# Patient Record
Sex: Male | Born: 1935 | Race: White | Hispanic: No | Marital: Married | Smoking: Former smoker
Health system: Southern US, Community
[De-identification: ages and names within clinical notes are randomized; demographics above are authoritative.]

## PROBLEM LIST (undated history)

## (undated) DIAGNOSIS — C9110 Chronic lymphocytic leukemia of B-cell type not having achieved remission: Secondary | ICD-10-CM

## (undated) DIAGNOSIS — E119 Type 2 diabetes mellitus without complications: Secondary | ICD-10-CM

## (undated) DIAGNOSIS — F329 Major depressive disorder, single episode, unspecified: Secondary | ICD-10-CM

## (undated) DIAGNOSIS — I493 Ventricular premature depolarization: Secondary | ICD-10-CM

## (undated) DIAGNOSIS — I350 Nonrheumatic aortic (valve) stenosis: Secondary | ICD-10-CM

## (undated) DIAGNOSIS — C679 Malignant neoplasm of bladder, unspecified: Secondary | ICD-10-CM

## (undated) DIAGNOSIS — I10 Essential (primary) hypertension: Secondary | ICD-10-CM

## (undated) DIAGNOSIS — I2510 Atherosclerotic heart disease of native coronary artery without angina pectoris: Secondary | ICD-10-CM

## (undated) DIAGNOSIS — I255 Ischemic cardiomyopathy: Secondary | ICD-10-CM

## (undated) DIAGNOSIS — C3490 Malignant neoplasm of unspecified part of unspecified bronchus or lung: Secondary | ICD-10-CM

## (undated) DIAGNOSIS — I252 Old myocardial infarction: Secondary | ICD-10-CM

## (undated) DIAGNOSIS — Z952 Presence of prosthetic heart valve: Secondary | ICD-10-CM

## (undated) DIAGNOSIS — F32A Depression, unspecified: Secondary | ICD-10-CM

## (undated) DIAGNOSIS — R918 Other nonspecific abnormal finding of lung field: Secondary | ICD-10-CM

## (undated) HISTORY — PX: INGUINAL HERNIA REPAIR: SUR1180

## (undated) HISTORY — DX: Chronic lymphocytic leukemia of B-cell type not having achieved remission: C91.10

## (undated) HISTORY — DX: Atherosclerotic heart disease of native coronary artery without angina pectoris: I25.10

## (undated) HISTORY — DX: Major depressive disorder, single episode, unspecified: F32.9

## (undated) HISTORY — DX: Type 2 diabetes mellitus without complications: E11.9

## (undated) HISTORY — DX: Malignant neoplasm of bladder, unspecified: C67.9

## (undated) HISTORY — DX: Nonrheumatic aortic (valve) stenosis: I35.0

## (undated) HISTORY — PX: LUNG REMOVAL, PARTIAL: SHX233

## (undated) HISTORY — PX: OTHER SURGICAL HISTORY: SHX169

## (undated) HISTORY — DX: Atherosclerotic heart disease of native coronary artery without angina pectoris: I25.2

## (undated) HISTORY — DX: Malignant neoplasm of unspecified part of unspecified bronchus or lung: C34.90

## (undated) HISTORY — DX: Essential (primary) hypertension: I10

## (undated) HISTORY — DX: Depression, unspecified: F32.A

## (undated) HISTORY — DX: Ischemic cardiomyopathy: I25.5

## (undated) HISTORY — DX: Ventricular premature depolarization: I49.3

## (undated) HISTORY — PX: CORONARY ARTERY BYPASS GRAFT: SHX141

---

## 2016-04-20 DIAGNOSIS — Z538 Procedure and treatment not carried out for other reasons: Secondary | ICD-10-CM | POA: Diagnosis not present

## 2016-04-20 DIAGNOSIS — Z8546 Personal history of malignant neoplasm of prostate: Secondary | ICD-10-CM | POA: Diagnosis not present

## 2016-04-20 DIAGNOSIS — Z86010 Personal history of colonic polyps: Secondary | ICD-10-CM | POA: Diagnosis not present

## 2016-04-20 DIAGNOSIS — Z8551 Personal history of malignant neoplasm of bladder: Secondary | ICD-10-CM | POA: Diagnosis not present

## 2016-04-20 DIAGNOSIS — K644 Residual hemorrhoidal skin tags: Secondary | ICD-10-CM | POA: Diagnosis not present

## 2016-04-20 DIAGNOSIS — K6289 Other specified diseases of anus and rectum: Secondary | ICD-10-CM | POA: Diagnosis not present

## 2016-04-20 DIAGNOSIS — Z1211 Encounter for screening for malignant neoplasm of colon: Secondary | ICD-10-CM | POA: Diagnosis not present

## 2016-04-20 DIAGNOSIS — D123 Benign neoplasm of transverse colon: Secondary | ICD-10-CM | POA: Diagnosis not present

## 2016-04-20 DIAGNOSIS — K5730 Diverticulosis of large intestine without perforation or abscess without bleeding: Secondary | ICD-10-CM | POA: Diagnosis not present

## 2016-04-20 DIAGNOSIS — Z856 Personal history of leukemia: Secondary | ICD-10-CM | POA: Diagnosis not present

## 2016-04-20 DIAGNOSIS — K648 Other hemorrhoids: Secondary | ICD-10-CM | POA: Diagnosis not present

## 2016-04-24 DIAGNOSIS — C3490 Malignant neoplasm of unspecified part of unspecified bronchus or lung: Secondary | ICD-10-CM | POA: Diagnosis not present

## 2016-04-27 DIAGNOSIS — C679 Malignant neoplasm of bladder, unspecified: Secondary | ICD-10-CM | POA: Diagnosis not present

## 2016-04-27 DIAGNOSIS — C61 Malignant neoplasm of prostate: Secondary | ICD-10-CM | POA: Diagnosis not present

## 2016-05-01 DIAGNOSIS — E1122 Type 2 diabetes mellitus with diabetic chronic kidney disease: Secondary | ICD-10-CM | POA: Diagnosis not present

## 2016-05-01 DIAGNOSIS — N183 Chronic kidney disease, stage 3 (moderate): Secondary | ICD-10-CM | POA: Diagnosis not present

## 2016-05-01 DIAGNOSIS — Z01818 Encounter for other preprocedural examination: Secondary | ICD-10-CM | POA: Diagnosis not present

## 2016-05-01 DIAGNOSIS — Z794 Long term (current) use of insulin: Secondary | ICD-10-CM | POA: Diagnosis not present

## 2016-05-03 DIAGNOSIS — Z951 Presence of aortocoronary bypass graft: Secondary | ICD-10-CM | POA: Diagnosis not present

## 2016-05-03 DIAGNOSIS — I2510 Atherosclerotic heart disease of native coronary artery without angina pectoris: Secondary | ICD-10-CM | POA: Diagnosis not present

## 2016-05-03 DIAGNOSIS — Z8551 Personal history of malignant neoplasm of bladder: Secondary | ICD-10-CM | POA: Diagnosis not present

## 2016-05-03 DIAGNOSIS — I10 Essential (primary) hypertension: Secondary | ICD-10-CM | POA: Diagnosis not present

## 2016-05-03 DIAGNOSIS — E119 Type 2 diabetes mellitus without complications: Secondary | ICD-10-CM | POA: Diagnosis not present

## 2016-05-03 DIAGNOSIS — C3411 Malignant neoplasm of upper lobe, right bronchus or lung: Secondary | ICD-10-CM | POA: Diagnosis not present

## 2016-05-03 DIAGNOSIS — Z794 Long term (current) use of insulin: Secondary | ICD-10-CM | POA: Diagnosis not present

## 2016-05-03 DIAGNOSIS — Z8546 Personal history of malignant neoplasm of prostate: Secondary | ICD-10-CM | POA: Diagnosis not present

## 2016-05-03 DIAGNOSIS — I5020 Unspecified systolic (congestive) heart failure: Secondary | ICD-10-CM | POA: Diagnosis not present

## 2016-05-03 DIAGNOSIS — Z856 Personal history of leukemia: Secondary | ICD-10-CM | POA: Diagnosis not present

## 2016-05-03 DIAGNOSIS — Z85118 Personal history of other malignant neoplasm of bronchus and lung: Secondary | ICD-10-CM | POA: Diagnosis not present

## 2016-05-03 DIAGNOSIS — Z87891 Personal history of nicotine dependence: Secondary | ICD-10-CM | POA: Diagnosis not present

## 2016-05-09 DIAGNOSIS — Z8551 Personal history of malignant neoplasm of bladder: Secondary | ICD-10-CM | POA: Diagnosis not present

## 2016-05-17 DIAGNOSIS — C3411 Malignant neoplasm of upper lobe, right bronchus or lung: Secondary | ICD-10-CM | POA: Diagnosis not present

## 2016-05-24 DIAGNOSIS — Z87891 Personal history of nicotine dependence: Secondary | ICD-10-CM | POA: Diagnosis not present

## 2016-05-24 DIAGNOSIS — E119 Type 2 diabetes mellitus without complications: Secondary | ICD-10-CM | POA: Diagnosis not present

## 2016-05-24 DIAGNOSIS — Z8551 Personal history of malignant neoplasm of bladder: Secondary | ICD-10-CM | POA: Diagnosis not present

## 2016-05-24 DIAGNOSIS — I129 Hypertensive chronic kidney disease with stage 1 through stage 4 chronic kidney disease, or unspecified chronic kidney disease: Secondary | ICD-10-CM | POA: Diagnosis not present

## 2016-05-24 DIAGNOSIS — N189 Chronic kidney disease, unspecified: Secondary | ICD-10-CM | POA: Diagnosis not present

## 2016-05-24 DIAGNOSIS — Z794 Long term (current) use of insulin: Secondary | ICD-10-CM | POA: Diagnosis not present

## 2016-05-24 DIAGNOSIS — I509 Heart failure, unspecified: Secondary | ICD-10-CM | POA: Diagnosis not present

## 2016-05-24 DIAGNOSIS — C3411 Malignant neoplasm of upper lobe, right bronchus or lung: Secondary | ICD-10-CM | POA: Diagnosis not present

## 2016-05-24 DIAGNOSIS — Z951 Presence of aortocoronary bypass graft: Secondary | ICD-10-CM | POA: Diagnosis not present

## 2016-05-24 DIAGNOSIS — D638 Anemia in other chronic diseases classified elsewhere: Secondary | ICD-10-CM | POA: Diagnosis not present

## 2016-05-24 DIAGNOSIS — Z510 Encounter for antineoplastic radiation therapy: Secondary | ICD-10-CM | POA: Diagnosis not present

## 2016-05-24 DIAGNOSIS — Z85828 Personal history of other malignant neoplasm of skin: Secondary | ICD-10-CM | POA: Diagnosis not present

## 2016-05-24 DIAGNOSIS — C9110 Chronic lymphocytic leukemia of B-cell type not having achieved remission: Secondary | ICD-10-CM | POA: Diagnosis not present

## 2016-05-24 DIAGNOSIS — I2510 Atherosclerotic heart disease of native coronary artery without angina pectoris: Secondary | ICD-10-CM | POA: Diagnosis not present

## 2016-06-02 DIAGNOSIS — C9110 Chronic lymphocytic leukemia of B-cell type not having achieved remission: Secondary | ICD-10-CM | POA: Diagnosis not present

## 2016-06-02 DIAGNOSIS — I2510 Atherosclerotic heart disease of native coronary artery without angina pectoris: Secondary | ICD-10-CM | POA: Diagnosis not present

## 2016-06-02 DIAGNOSIS — I509 Heart failure, unspecified: Secondary | ICD-10-CM | POA: Diagnosis not present

## 2016-06-02 DIAGNOSIS — D638 Anemia in other chronic diseases classified elsewhere: Secondary | ICD-10-CM | POA: Diagnosis not present

## 2016-06-02 DIAGNOSIS — Z510 Encounter for antineoplastic radiation therapy: Secondary | ICD-10-CM | POA: Diagnosis not present

## 2016-06-02 DIAGNOSIS — C3411 Malignant neoplasm of upper lobe, right bronchus or lung: Secondary | ICD-10-CM | POA: Diagnosis not present

## 2016-06-09 DIAGNOSIS — I2510 Atherosclerotic heart disease of native coronary artery without angina pectoris: Secondary | ICD-10-CM | POA: Diagnosis not present

## 2016-06-09 DIAGNOSIS — D638 Anemia in other chronic diseases classified elsewhere: Secondary | ICD-10-CM | POA: Diagnosis not present

## 2016-06-09 DIAGNOSIS — Z510 Encounter for antineoplastic radiation therapy: Secondary | ICD-10-CM | POA: Diagnosis not present

## 2016-06-09 DIAGNOSIS — C9110 Chronic lymphocytic leukemia of B-cell type not having achieved remission: Secondary | ICD-10-CM | POA: Diagnosis not present

## 2016-06-09 DIAGNOSIS — I509 Heart failure, unspecified: Secondary | ICD-10-CM | POA: Diagnosis not present

## 2016-06-09 DIAGNOSIS — C3411 Malignant neoplasm of upper lobe, right bronchus or lung: Secondary | ICD-10-CM | POA: Diagnosis not present

## 2016-06-13 DIAGNOSIS — I509 Heart failure, unspecified: Secondary | ICD-10-CM | POA: Diagnosis not present

## 2016-06-13 DIAGNOSIS — C3411 Malignant neoplasm of upper lobe, right bronchus or lung: Secondary | ICD-10-CM | POA: Diagnosis not present

## 2016-06-13 DIAGNOSIS — Z510 Encounter for antineoplastic radiation therapy: Secondary | ICD-10-CM | POA: Diagnosis not present

## 2016-06-13 DIAGNOSIS — D638 Anemia in other chronic diseases classified elsewhere: Secondary | ICD-10-CM | POA: Diagnosis not present

## 2016-06-13 DIAGNOSIS — C9110 Chronic lymphocytic leukemia of B-cell type not having achieved remission: Secondary | ICD-10-CM | POA: Diagnosis not present

## 2016-06-13 DIAGNOSIS — I2510 Atherosclerotic heart disease of native coronary artery without angina pectoris: Secondary | ICD-10-CM | POA: Diagnosis not present

## 2016-06-15 DIAGNOSIS — C3411 Malignant neoplasm of upper lobe, right bronchus or lung: Secondary | ICD-10-CM | POA: Diagnosis not present

## 2016-06-15 DIAGNOSIS — Z510 Encounter for antineoplastic radiation therapy: Secondary | ICD-10-CM | POA: Diagnosis not present

## 2016-06-15 DIAGNOSIS — I509 Heart failure, unspecified: Secondary | ICD-10-CM | POA: Diagnosis not present

## 2016-06-15 DIAGNOSIS — I2510 Atherosclerotic heart disease of native coronary artery without angina pectoris: Secondary | ICD-10-CM | POA: Diagnosis not present

## 2016-06-15 DIAGNOSIS — C9110 Chronic lymphocytic leukemia of B-cell type not having achieved remission: Secondary | ICD-10-CM | POA: Diagnosis not present

## 2016-06-15 DIAGNOSIS — D638 Anemia in other chronic diseases classified elsewhere: Secondary | ICD-10-CM | POA: Diagnosis not present

## 2016-06-16 DIAGNOSIS — C9110 Chronic lymphocytic leukemia of B-cell type not having achieved remission: Secondary | ICD-10-CM | POA: Diagnosis not present

## 2016-06-16 DIAGNOSIS — C3411 Malignant neoplasm of upper lobe, right bronchus or lung: Secondary | ICD-10-CM | POA: Diagnosis not present

## 2016-06-16 DIAGNOSIS — D638 Anemia in other chronic diseases classified elsewhere: Secondary | ICD-10-CM | POA: Diagnosis not present

## 2016-06-16 DIAGNOSIS — Z510 Encounter for antineoplastic radiation therapy: Secondary | ICD-10-CM | POA: Diagnosis not present

## 2016-06-16 DIAGNOSIS — I2510 Atherosclerotic heart disease of native coronary artery without angina pectoris: Secondary | ICD-10-CM | POA: Diagnosis not present

## 2016-06-16 DIAGNOSIS — I509 Heart failure, unspecified: Secondary | ICD-10-CM | POA: Diagnosis not present

## 2016-06-19 DIAGNOSIS — Z510 Encounter for antineoplastic radiation therapy: Secondary | ICD-10-CM | POA: Diagnosis not present

## 2016-06-19 DIAGNOSIS — I2510 Atherosclerotic heart disease of native coronary artery without angina pectoris: Secondary | ICD-10-CM | POA: Diagnosis not present

## 2016-06-19 DIAGNOSIS — D638 Anemia in other chronic diseases classified elsewhere: Secondary | ICD-10-CM | POA: Diagnosis not present

## 2016-06-19 DIAGNOSIS — C3411 Malignant neoplasm of upper lobe, right bronchus or lung: Secondary | ICD-10-CM | POA: Diagnosis not present

## 2016-06-19 DIAGNOSIS — E119 Type 2 diabetes mellitus without complications: Secondary | ICD-10-CM | POA: Diagnosis not present

## 2016-06-19 DIAGNOSIS — Z87891 Personal history of nicotine dependence: Secondary | ICD-10-CM | POA: Diagnosis not present

## 2016-06-19 DIAGNOSIS — I129 Hypertensive chronic kidney disease with stage 1 through stage 4 chronic kidney disease, or unspecified chronic kidney disease: Secondary | ICD-10-CM | POA: Diagnosis not present

## 2016-06-19 DIAGNOSIS — Z8551 Personal history of malignant neoplasm of bladder: Secondary | ICD-10-CM | POA: Diagnosis not present

## 2016-06-19 DIAGNOSIS — I509 Heart failure, unspecified: Secondary | ICD-10-CM | POA: Diagnosis not present

## 2016-06-19 DIAGNOSIS — C9110 Chronic lymphocytic leukemia of B-cell type not having achieved remission: Secondary | ICD-10-CM | POA: Diagnosis not present

## 2016-06-19 DIAGNOSIS — Z794 Long term (current) use of insulin: Secondary | ICD-10-CM | POA: Diagnosis not present

## 2016-06-19 DIAGNOSIS — Z951 Presence of aortocoronary bypass graft: Secondary | ICD-10-CM | POA: Diagnosis not present

## 2016-06-19 DIAGNOSIS — N189 Chronic kidney disease, unspecified: Secondary | ICD-10-CM | POA: Diagnosis not present

## 2016-06-19 DIAGNOSIS — Z85828 Personal history of other malignant neoplasm of skin: Secondary | ICD-10-CM | POA: Diagnosis not present

## 2016-06-21 DIAGNOSIS — I2510 Atherosclerotic heart disease of native coronary artery without angina pectoris: Secondary | ICD-10-CM | POA: Diagnosis not present

## 2016-06-21 DIAGNOSIS — C3411 Malignant neoplasm of upper lobe, right bronchus or lung: Secondary | ICD-10-CM | POA: Diagnosis not present

## 2016-06-21 DIAGNOSIS — I509 Heart failure, unspecified: Secondary | ICD-10-CM | POA: Diagnosis not present

## 2016-06-21 DIAGNOSIS — Z510 Encounter for antineoplastic radiation therapy: Secondary | ICD-10-CM | POA: Diagnosis not present

## 2016-06-21 DIAGNOSIS — D638 Anemia in other chronic diseases classified elsewhere: Secondary | ICD-10-CM | POA: Diagnosis not present

## 2016-06-21 DIAGNOSIS — C9110 Chronic lymphocytic leukemia of B-cell type not having achieved remission: Secondary | ICD-10-CM | POA: Diagnosis not present

## 2016-06-23 DIAGNOSIS — C3411 Malignant neoplasm of upper lobe, right bronchus or lung: Secondary | ICD-10-CM | POA: Diagnosis not present

## 2016-06-23 DIAGNOSIS — I509 Heart failure, unspecified: Secondary | ICD-10-CM | POA: Diagnosis not present

## 2016-06-23 DIAGNOSIS — Z510 Encounter for antineoplastic radiation therapy: Secondary | ICD-10-CM | POA: Diagnosis not present

## 2016-06-23 DIAGNOSIS — D638 Anemia in other chronic diseases classified elsewhere: Secondary | ICD-10-CM | POA: Diagnosis not present

## 2016-06-23 DIAGNOSIS — I2510 Atherosclerotic heart disease of native coronary artery without angina pectoris: Secondary | ICD-10-CM | POA: Diagnosis not present

## 2016-06-23 DIAGNOSIS — C9110 Chronic lymphocytic leukemia of B-cell type not having achieved remission: Secondary | ICD-10-CM | POA: Diagnosis not present

## 2016-07-07 DIAGNOSIS — C44319 Basal cell carcinoma of skin of other parts of face: Secondary | ICD-10-CM | POA: Diagnosis not present

## 2016-07-07 DIAGNOSIS — C44112 Basal cell carcinoma of skin of right eyelid, including canthus: Secondary | ICD-10-CM | POA: Diagnosis not present

## 2016-07-14 DIAGNOSIS — I7389 Other specified peripheral vascular diseases: Secondary | ICD-10-CM | POA: Diagnosis not present

## 2016-07-14 DIAGNOSIS — B351 Tinea unguium: Secondary | ICD-10-CM | POA: Diagnosis not present

## 2016-09-18 DIAGNOSIS — C3490 Malignant neoplasm of unspecified part of unspecified bronchus or lung: Secondary | ICD-10-CM | POA: Diagnosis not present

## 2016-10-25 DIAGNOSIS — E119 Type 2 diabetes mellitus without complications: Secondary | ICD-10-CM | POA: Diagnosis not present

## 2016-11-28 DIAGNOSIS — Z23 Encounter for immunization: Secondary | ICD-10-CM | POA: Diagnosis not present

## 2016-12-05 DIAGNOSIS — C679 Malignant neoplasm of bladder, unspecified: Secondary | ICD-10-CM | POA: Diagnosis not present

## 2016-12-14 DIAGNOSIS — N183 Chronic kidney disease, stage 3 (moderate): Secondary | ICD-10-CM | POA: Diagnosis not present

## 2016-12-14 DIAGNOSIS — E1122 Type 2 diabetes mellitus with diabetic chronic kidney disease: Secondary | ICD-10-CM | POA: Diagnosis not present

## 2016-12-14 DIAGNOSIS — I2510 Atherosclerotic heart disease of native coronary artery without angina pectoris: Secondary | ICD-10-CM | POA: Diagnosis not present

## 2016-12-14 DIAGNOSIS — I5033 Acute on chronic diastolic (congestive) heart failure: Secondary | ICD-10-CM | POA: Diagnosis not present

## 2016-12-14 DIAGNOSIS — I130 Hypertensive heart and chronic kidney disease with heart failure and stage 1 through stage 4 chronic kidney disease, or unspecified chronic kidney disease: Secondary | ICD-10-CM | POA: Diagnosis not present

## 2016-12-14 DIAGNOSIS — I248 Other forms of acute ischemic heart disease: Secondary | ICD-10-CM | POA: Diagnosis not present

## 2016-12-14 DIAGNOSIS — R0602 Shortness of breath: Secondary | ICD-10-CM | POA: Diagnosis not present

## 2016-12-16 DIAGNOSIS — E1122 Type 2 diabetes mellitus with diabetic chronic kidney disease: Secondary | ICD-10-CM | POA: Diagnosis present

## 2016-12-16 DIAGNOSIS — Z902 Acquired absence of lung [part of]: Secondary | ICD-10-CM | POA: Diagnosis not present

## 2016-12-16 DIAGNOSIS — I5033 Acute on chronic diastolic (congestive) heart failure: Secondary | ICD-10-CM | POA: Diagnosis present

## 2016-12-16 DIAGNOSIS — I350 Nonrheumatic aortic (valve) stenosis: Secondary | ICD-10-CM | POA: Diagnosis present

## 2016-12-16 DIAGNOSIS — I130 Hypertensive heart and chronic kidney disease with heart failure and stage 1 through stage 4 chronic kidney disease, or unspecified chronic kidney disease: Secondary | ICD-10-CM | POA: Diagnosis present

## 2016-12-16 DIAGNOSIS — I255 Ischemic cardiomyopathy: Secondary | ICD-10-CM | POA: Diagnosis present

## 2016-12-16 DIAGNOSIS — Z951 Presence of aortocoronary bypass graft: Secondary | ICD-10-CM | POA: Diagnosis not present

## 2016-12-16 DIAGNOSIS — R0602 Shortness of breath: Secondary | ICD-10-CM | POA: Diagnosis not present

## 2016-12-16 DIAGNOSIS — Z856 Personal history of leukemia: Secondary | ICD-10-CM | POA: Diagnosis not present

## 2016-12-16 DIAGNOSIS — Z87891 Personal history of nicotine dependence: Secondary | ICD-10-CM | POA: Diagnosis not present

## 2016-12-16 DIAGNOSIS — Z8546 Personal history of malignant neoplasm of prostate: Secondary | ICD-10-CM | POA: Diagnosis not present

## 2016-12-16 DIAGNOSIS — Z8551 Personal history of malignant neoplasm of bladder: Secondary | ICD-10-CM | POA: Diagnosis not present

## 2016-12-16 DIAGNOSIS — E785 Hyperlipidemia, unspecified: Secondary | ICD-10-CM | POA: Diagnosis present

## 2016-12-16 DIAGNOSIS — I2510 Atherosclerotic heart disease of native coronary artery without angina pectoris: Secondary | ICD-10-CM | POA: Diagnosis present

## 2016-12-16 DIAGNOSIS — Z9111 Patient's noncompliance with dietary regimen: Secondary | ICD-10-CM | POA: Diagnosis not present

## 2016-12-16 DIAGNOSIS — N183 Chronic kidney disease, stage 3 (moderate): Secondary | ICD-10-CM | POA: Diagnosis present

## 2016-12-16 DIAGNOSIS — I248 Other forms of acute ischemic heart disease: Secondary | ICD-10-CM | POA: Diagnosis present

## 2016-12-16 DIAGNOSIS — Z794 Long term (current) use of insulin: Secondary | ICD-10-CM | POA: Diagnosis not present

## 2016-12-16 DIAGNOSIS — Z85118 Personal history of other malignant neoplasm of bronchus and lung: Secondary | ICD-10-CM | POA: Diagnosis not present

## 2016-12-26 DIAGNOSIS — I5023 Acute on chronic systolic (congestive) heart failure: Secondary | ICD-10-CM | POA: Diagnosis not present

## 2017-01-01 DIAGNOSIS — I5022 Chronic systolic (congestive) heart failure: Secondary | ICD-10-CM | POA: Diagnosis not present

## 2017-01-15 DIAGNOSIS — I509 Heart failure, unspecified: Secondary | ICD-10-CM | POA: Diagnosis not present

## 2017-01-25 DIAGNOSIS — C3490 Malignant neoplasm of unspecified part of unspecified bronchus or lung: Secondary | ICD-10-CM | POA: Diagnosis not present

## 2017-01-25 DIAGNOSIS — I5022 Chronic systolic (congestive) heart failure: Secondary | ICD-10-CM | POA: Diagnosis not present

## 2017-02-02 DIAGNOSIS — C9190 Lymphoid leukemia, unspecified not having achieved remission: Secondary | ICD-10-CM | POA: Diagnosis not present

## 2017-02-02 DIAGNOSIS — C3490 Malignant neoplasm of unspecified part of unspecified bronchus or lung: Secondary | ICD-10-CM | POA: Diagnosis not present

## 2017-02-07 DIAGNOSIS — C61 Malignant neoplasm of prostate: Secondary | ICD-10-CM | POA: Diagnosis not present

## 2017-02-07 DIAGNOSIS — C9111 Chronic lymphocytic leukemia of B-cell type in remission: Secondary | ICD-10-CM | POA: Diagnosis not present

## 2017-02-07 DIAGNOSIS — C679 Malignant neoplasm of bladder, unspecified: Secondary | ICD-10-CM | POA: Diagnosis not present

## 2017-02-07 DIAGNOSIS — C7801 Secondary malignant neoplasm of right lung: Secondary | ICD-10-CM | POA: Diagnosis not present

## 2017-03-01 DIAGNOSIS — I5022 Chronic systolic (congestive) heart failure: Secondary | ICD-10-CM | POA: Diagnosis not present

## 2017-03-04 DIAGNOSIS — I493 Ventricular premature depolarization: Secondary | ICD-10-CM | POA: Diagnosis not present

## 2017-03-21 DIAGNOSIS — I493 Ventricular premature depolarization: Secondary | ICD-10-CM | POA: Diagnosis not present

## 2017-03-28 DIAGNOSIS — M79674 Pain in right toe(s): Secondary | ICD-10-CM | POA: Diagnosis not present

## 2017-03-28 DIAGNOSIS — E1159 Type 2 diabetes mellitus with other circulatory complications: Secondary | ICD-10-CM | POA: Diagnosis not present

## 2017-03-28 DIAGNOSIS — B351 Tinea unguium: Secondary | ICD-10-CM | POA: Diagnosis not present

## 2017-03-28 DIAGNOSIS — M79675 Pain in left toe(s): Secondary | ICD-10-CM | POA: Diagnosis not present

## 2017-03-28 DIAGNOSIS — I7091 Generalized atherosclerosis: Secondary | ICD-10-CM | POA: Diagnosis not present

## 2017-04-02 DIAGNOSIS — I259 Chronic ischemic heart disease, unspecified: Secondary | ICD-10-CM | POA: Diagnosis not present

## 2017-04-02 DIAGNOSIS — I110 Hypertensive heart disease with heart failure: Secondary | ICD-10-CM | POA: Diagnosis not present

## 2017-04-02 DIAGNOSIS — I5022 Chronic systolic (congestive) heart failure: Secondary | ICD-10-CM | POA: Diagnosis not present

## 2017-04-02 DIAGNOSIS — I493 Ventricular premature depolarization: Secondary | ICD-10-CM | POA: Diagnosis not present

## 2017-04-02 DIAGNOSIS — Z951 Presence of aortocoronary bypass graft: Secondary | ICD-10-CM | POA: Diagnosis not present

## 2017-04-27 DIAGNOSIS — C3490 Malignant neoplasm of unspecified part of unspecified bronchus or lung: Secondary | ICD-10-CM | POA: Diagnosis not present

## 2017-04-30 DIAGNOSIS — C9190 Lymphoid leukemia, unspecified not having achieved remission: Secondary | ICD-10-CM | POA: Diagnosis not present

## 2017-04-30 DIAGNOSIS — R05 Cough: Secondary | ICD-10-CM | POA: Diagnosis not present

## 2017-04-30 DIAGNOSIS — Z794 Long term (current) use of insulin: Secondary | ICD-10-CM | POA: Diagnosis not present

## 2017-04-30 DIAGNOSIS — E1122 Type 2 diabetes mellitus with diabetic chronic kidney disease: Secondary | ICD-10-CM | POA: Diagnosis not present

## 2017-04-30 DIAGNOSIS — F5104 Psychophysiologic insomnia: Secondary | ICD-10-CM | POA: Diagnosis not present

## 2017-04-30 DIAGNOSIS — N183 Chronic kidney disease, stage 3 (moderate): Secondary | ICD-10-CM | POA: Diagnosis not present

## 2017-04-30 DIAGNOSIS — I5022 Chronic systolic (congestive) heart failure: Secondary | ICD-10-CM | POA: Diagnosis not present

## 2017-05-01 DIAGNOSIS — C3411 Malignant neoplasm of upper lobe, right bronchus or lung: Secondary | ICD-10-CM | POA: Diagnosis not present

## 2017-05-01 DIAGNOSIS — C9190 Lymphoid leukemia, unspecified not having achieved remission: Secondary | ICD-10-CM | POA: Diagnosis not present

## 2017-05-10 ENCOUNTER — Encounter: Payer: Self-pay | Admitting: Family Medicine

## 2017-05-10 DIAGNOSIS — C3491 Malignant neoplasm of unspecified part of right bronchus or lung: Secondary | ICD-10-CM | POA: Diagnosis not present

## 2017-05-10 DIAGNOSIS — J988 Other specified respiratory disorders: Secondary | ICD-10-CM | POA: Diagnosis not present

## 2017-05-10 DIAGNOSIS — J984 Other disorders of lung: Secondary | ICD-10-CM | POA: Diagnosis not present

## 2017-05-10 DIAGNOSIS — C9190 Lymphoid leukemia, unspecified not having achieved remission: Secondary | ICD-10-CM | POA: Diagnosis not present

## 2017-05-10 DIAGNOSIS — R05 Cough: Secondary | ICD-10-CM | POA: Diagnosis not present

## 2017-05-24 DIAGNOSIS — I5022 Chronic systolic (congestive) heart failure: Secondary | ICD-10-CM | POA: Diagnosis not present

## 2017-05-30 DIAGNOSIS — I5022 Chronic systolic (congestive) heart failure: Secondary | ICD-10-CM | POA: Diagnosis not present

## 2017-05-30 DIAGNOSIS — M79675 Pain in left toe(s): Secondary | ICD-10-CM | POA: Diagnosis not present

## 2017-05-30 DIAGNOSIS — I493 Ventricular premature depolarization: Secondary | ICD-10-CM | POA: Diagnosis not present

## 2017-05-30 DIAGNOSIS — Z951 Presence of aortocoronary bypass graft: Secondary | ICD-10-CM | POA: Diagnosis not present

## 2017-05-30 DIAGNOSIS — I259 Chronic ischemic heart disease, unspecified: Secondary | ICD-10-CM | POA: Diagnosis not present

## 2017-05-30 DIAGNOSIS — E1159 Type 2 diabetes mellitus with other circulatory complications: Secondary | ICD-10-CM | POA: Diagnosis not present

## 2017-05-30 DIAGNOSIS — M79674 Pain in right toe(s): Secondary | ICD-10-CM | POA: Diagnosis not present

## 2017-05-30 DIAGNOSIS — B351 Tinea unguium: Secondary | ICD-10-CM | POA: Diagnosis not present

## 2017-06-05 DIAGNOSIS — C672 Malignant neoplasm of lateral wall of bladder: Secondary | ICD-10-CM | POA: Diagnosis not present

## 2017-07-10 ENCOUNTER — Encounter: Payer: Self-pay | Admitting: Family Medicine

## 2017-07-10 DIAGNOSIS — D229 Melanocytic nevi, unspecified: Secondary | ICD-10-CM | POA: Diagnosis not present

## 2017-07-10 DIAGNOSIS — Z85828 Personal history of other malignant neoplasm of skin: Secondary | ICD-10-CM | POA: Diagnosis not present

## 2017-07-10 DIAGNOSIS — C4441 Basal cell carcinoma of skin of scalp and neck: Secondary | ICD-10-CM | POA: Diagnosis not present

## 2017-07-10 DIAGNOSIS — L821 Other seborrheic keratosis: Secondary | ICD-10-CM | POA: Diagnosis not present

## 2017-07-10 DIAGNOSIS — L570 Actinic keratosis: Secondary | ICD-10-CM | POA: Diagnosis not present

## 2017-07-10 DIAGNOSIS — D492 Neoplasm of unspecified behavior of bone, soft tissue, and skin: Secondary | ICD-10-CM | POA: Diagnosis not present

## 2017-07-10 DIAGNOSIS — D485 Neoplasm of uncertain behavior of skin: Secondary | ICD-10-CM | POA: Diagnosis not present

## 2017-07-20 ENCOUNTER — Encounter: Payer: Self-pay | Admitting: Family Medicine

## 2017-07-20 DIAGNOSIS — C4441 Basal cell carcinoma of skin of scalp and neck: Secondary | ICD-10-CM | POA: Diagnosis not present

## 2017-07-20 DIAGNOSIS — D492 Neoplasm of unspecified behavior of bone, soft tissue, and skin: Secondary | ICD-10-CM | POA: Diagnosis not present

## 2017-07-20 DIAGNOSIS — D044 Carcinoma in situ of skin of scalp and neck: Secondary | ICD-10-CM | POA: Diagnosis not present

## 2017-07-20 DIAGNOSIS — C4442 Squamous cell carcinoma of skin of scalp and neck: Secondary | ICD-10-CM | POA: Diagnosis not present

## 2017-07-25 DIAGNOSIS — J984 Other disorders of lung: Secondary | ICD-10-CM | POA: Diagnosis not present

## 2017-07-25 DIAGNOSIS — R05 Cough: Secondary | ICD-10-CM | POA: Diagnosis not present

## 2017-07-30 DIAGNOSIS — C3490 Malignant neoplasm of unspecified part of unspecified bronchus or lung: Secondary | ICD-10-CM | POA: Diagnosis not present

## 2017-07-30 DIAGNOSIS — Z0000 Encounter for general adult medical examination without abnormal findings: Secondary | ICD-10-CM | POA: Diagnosis not present

## 2017-08-01 DIAGNOSIS — M79674 Pain in right toe(s): Secondary | ICD-10-CM | POA: Diagnosis not present

## 2017-08-01 DIAGNOSIS — B351 Tinea unguium: Secondary | ICD-10-CM | POA: Diagnosis not present

## 2017-08-01 DIAGNOSIS — M79675 Pain in left toe(s): Secondary | ICD-10-CM | POA: Diagnosis not present

## 2017-08-01 DIAGNOSIS — E1159 Type 2 diabetes mellitus with other circulatory complications: Secondary | ICD-10-CM | POA: Diagnosis not present

## 2017-08-02 DIAGNOSIS — C3490 Malignant neoplasm of unspecified part of unspecified bronchus or lung: Secondary | ICD-10-CM | POA: Diagnosis not present

## 2017-08-02 DIAGNOSIS — J181 Lobar pneumonia, unspecified organism: Secondary | ICD-10-CM | POA: Diagnosis not present

## 2017-08-02 DIAGNOSIS — J910 Malignant pleural effusion: Secondary | ICD-10-CM | POA: Diagnosis not present

## 2017-08-16 ENCOUNTER — Encounter: Payer: Self-pay | Admitting: Family Medicine

## 2017-08-16 DIAGNOSIS — Z23 Encounter for immunization: Secondary | ICD-10-CM | POA: Diagnosis not present

## 2017-08-16 DIAGNOSIS — D649 Anemia, unspecified: Secondary | ICD-10-CM | POA: Diagnosis not present

## 2017-08-22 DIAGNOSIS — Z794 Long term (current) use of insulin: Secondary | ICD-10-CM | POA: Diagnosis not present

## 2017-08-22 DIAGNOSIS — N183 Chronic kidney disease, stage 3 (moderate): Secondary | ICD-10-CM | POA: Diagnosis not present

## 2017-08-22 DIAGNOSIS — E1122 Type 2 diabetes mellitus with diabetic chronic kidney disease: Secondary | ICD-10-CM | POA: Diagnosis not present

## 2017-09-05 DIAGNOSIS — C3490 Malignant neoplasm of unspecified part of unspecified bronchus or lung: Secondary | ICD-10-CM | POA: Diagnosis not present

## 2017-09-05 DIAGNOSIS — R918 Other nonspecific abnormal finding of lung field: Secondary | ICD-10-CM | POA: Diagnosis not present

## 2017-09-05 DIAGNOSIS — J90 Pleural effusion, not elsewhere classified: Secondary | ICD-10-CM | POA: Diagnosis not present

## 2017-09-06 DIAGNOSIS — C4442 Squamous cell carcinoma of skin of scalp and neck: Secondary | ICD-10-CM | POA: Diagnosis not present

## 2017-09-14 DIAGNOSIS — I5042 Chronic combined systolic (congestive) and diastolic (congestive) heart failure: Secondary | ICD-10-CM | POA: Diagnosis not present

## 2017-09-14 DIAGNOSIS — I110 Hypertensive heart disease with heart failure: Secondary | ICD-10-CM | POA: Diagnosis not present

## 2017-09-14 DIAGNOSIS — Z951 Presence of aortocoronary bypass graft: Secondary | ICD-10-CM | POA: Diagnosis not present

## 2017-09-14 DIAGNOSIS — I2510 Atherosclerotic heart disease of native coronary artery without angina pectoris: Secondary | ICD-10-CM | POA: Diagnosis not present

## 2017-09-14 DIAGNOSIS — I350 Nonrheumatic aortic (valve) stenosis: Secondary | ICD-10-CM | POA: Diagnosis not present

## 2017-09-14 DIAGNOSIS — C3491 Malignant neoplasm of unspecified part of right bronchus or lung: Secondary | ICD-10-CM | POA: Diagnosis not present

## 2017-09-14 DIAGNOSIS — I493 Ventricular premature depolarization: Secondary | ICD-10-CM | POA: Diagnosis not present

## 2017-09-24 DIAGNOSIS — C3411 Malignant neoplasm of upper lobe, right bronchus or lung: Secondary | ICD-10-CM | POA: Diagnosis not present

## 2017-09-24 DIAGNOSIS — C9190 Lymphoid leukemia, unspecified not having achieved remission: Secondary | ICD-10-CM | POA: Diagnosis not present

## 2017-09-26 DIAGNOSIS — I7091 Generalized atherosclerosis: Secondary | ICD-10-CM | POA: Diagnosis not present

## 2017-09-26 DIAGNOSIS — B351 Tinea unguium: Secondary | ICD-10-CM | POA: Diagnosis not present

## 2017-09-26 DIAGNOSIS — M79674 Pain in right toe(s): Secondary | ICD-10-CM | POA: Diagnosis not present

## 2017-09-26 DIAGNOSIS — M79675 Pain in left toe(s): Secondary | ICD-10-CM | POA: Diagnosis not present

## 2017-09-26 DIAGNOSIS — E1159 Type 2 diabetes mellitus with other circulatory complications: Secondary | ICD-10-CM | POA: Diagnosis not present

## 2017-10-08 ENCOUNTER — Ambulatory Visit (INDEPENDENT_AMBULATORY_CARE_PROVIDER_SITE_OTHER): Payer: Medicare Other | Admitting: Family Medicine

## 2017-10-08 ENCOUNTER — Encounter: Payer: Self-pay | Admitting: Family Medicine

## 2017-10-08 VITALS — BP 116/78 | HR 73 | Temp 98.5°F | Ht 65.0 in | Wt 153.4 lb

## 2017-10-08 DIAGNOSIS — I10 Essential (primary) hypertension: Secondary | ICD-10-CM

## 2017-10-08 DIAGNOSIS — I2510 Atherosclerotic heart disease of native coronary artery without angina pectoris: Secondary | ICD-10-CM | POA: Diagnosis not present

## 2017-10-08 DIAGNOSIS — C9190 Lymphoid leukemia, unspecified not having achieved remission: Secondary | ICD-10-CM

## 2017-10-08 DIAGNOSIS — Z8546 Personal history of malignant neoplasm of prostate: Secondary | ICD-10-CM | POA: Diagnosis not present

## 2017-10-08 DIAGNOSIS — C9110 Chronic lymphocytic leukemia of B-cell type not having achieved remission: Secondary | ICD-10-CM

## 2017-10-08 DIAGNOSIS — E119 Type 2 diabetes mellitus without complications: Secondary | ICD-10-CM

## 2017-10-08 NOTE — Progress Notes (Signed)
Pre visit review using our clinic review tool, if applicable. No additional management support is needed unless otherwise documented below in the visit note. 

## 2017-10-08 NOTE — Progress Notes (Signed)
Chief Complaint  Patient presents with  . New Patient (Initial Visit)       New Patient Visit SUBJECTIVE: HPI: Austin Lloyd is an 84 y.o.male who is being seen for establishing care.  He is here with his wife.  The patient was previously seen at office in [place]. Recently moved down.  Patient has a history of diabetes.  He is currently on insulin. Unsure when last testing was done, would like to await records being sent.  Patient has a history of CLL.  She was told by her oncologist that she would need to have routine skin exams.  He would like a referral to both an oncologist and dermatologist.  The patient also has a history of both bladder and prostate cancer.  He is requesting referral to a urologist.  He has a history of coronary artery disease.  He has had heart disease and had a bypass.  No Known Allergies  Past Medical History:  Diagnosis Date  . Cancer (HCC)    Lung, Leukemia, prostate and bladder  . Depression   . Diabetes mellitus without complication ([employer])   . Heart disease   . Heart murmur   . Hypertension    Past Surgical History:  Procedure Laterality Date  . LUNG REMOVAL, PARTIAL     History reviewed. No pertinent family history. No Known Allergies  Current Outpatient Medications:  .  aspirin EC 81 MG tablet, Take 81 mg by mouth daily., Disp: , Rfl:  .  furosemide (LASIX) 20 MG tablet, Take 20 mg by mouth daily., Disp: , Rfl:  .  glucose blood (ACCU-CHEK ACTIVE STRIPS) test strip, Use as directed to check blood sugar twice daily, Disp: , Rfl:  .  insulin detemir (LEVEMIR) 100 UNIT/ML injection, Inject 30-35 Units into the skin daily., Disp: , Rfl:  .  Iron Combinations (IRON COMPLEX) CAPS, Take by mouth. Tale every other day, Disp: , Rfl:  .  iron polysaccharides (NIFEREX) 150 MG capsule, Take 150 mg by mouth daily., Disp: , Rfl:  .  Lancets MISC, Use three times daily to check blood sugar., Disp: , Rfl:  .  losartan (COZAAR) 25 MG tablet, Take 25 mg  by mouth daily., Disp: , Rfl:  .  lovastatin (MEVACOR) 40 MG tablet, Take 40 mg by mouth at bedtime., Disp: , Rfl:  .  Melatonin 5 MG TABS, Take by mouth., Disp: , Rfl:  .  metoprolol succinate (TOPROL-XL) 25 MG 24 hr tablet, Take 25 mg by mouth 2 (two) times daily., Disp: , Rfl:  .  Multiple Vitamin (MULTIVITAMIN) tablet, Take 1 tablet by mouth daily., Disp: , Rfl:  .  Multiple Vitamins-Minerals (CENTRUM SILVER PO), Take by mouth., Disp: , Rfl:  .  tamsulosin (FLOMAX) 0.4 MG CAPS capsule, Take 0.4 mg by mouth daily., Disp: , Rfl:   ROS Cardiovascular: Denies chest pain  Respiratory: Denies dyspnea   OBJECTIVE: BP 116/78 (BP Location: Left Arm, Patient Position: Sitting, Cuff Size: Normal)   Pulse 73   Temp 98.5 F (36.9 C) (Oral)   Ht 5\' 5"  (1.651 m)   Wt 153 lb 6 oz (69.6 kg)   SpO2 93%   BMI 25.52 kg/m   Constitutional: -  VS reviewed -  Well developed, well nourished, appears stated age -  No apparent distress  Psychiatric: -  Oriented to person, place, and time -  Memory intact -  Affect and mood normal -  Fluent conversation, good eye contact -  Judgment and  insight age appropriate  Eye: -  Conjunctivae clear, no discharge -  Pupils symmetric, round, reactive to light  ENMT: -  MMM    Pharynx moist, no exudate, no erythema  Neck: -  No gross swelling, no palpable masses -  Thyroid midline, not enlarged, mobile, no palpable masses  Cardiovascular: -  RRR -  +SEM heard loudest at aortic listening post -  No LE edema  Respiratory: -  Normal respiratory effort, no accessory muscle use, no retraction -  Breath sounds equal, no wheezes, no ronchi, no crackles  Gastrointestinal: -  Bowel sounds normal -  No tenderness, no distention, no guarding, no masses  Neurological:  -  CN II - XII grossly intact -  Sensation grossly intact to light touch, equal bilaterally  Musculoskeletal: -  No clubbing, no cyanosis -  Gait normal  Skin: -  No significant lesion on  inspection -  Warm and dry to palpation   ASSESSMENT/PLAN: Type 2 diabetes mellitus without complication, unspecified whether long term insulin use (HCC)  Essential hypertension  Coronary artery disease involving native heart without angina pectoris, unspecified vessel or lesion type - Plan: Ambulatory referral to Cardiology  History of prostate cancer - Plan: Ambulatory referral to Urology  CLL (chronic lymphocytic leukemia) ([place]) - Plan: Ambulatory referral to Oncology, Ambulatory referral to Dermatology  Patient instructed to sign release of records form from his previous PCP. Cont current medications for now until I can see his records. Will hold off on labs also. Referrals placed given hx.  Patient should return in 3 mo. The patient voiced understanding and agreement to the plan.   [place], DO 10/08/17  1:45 PM

## 2017-10-08 NOTE — Patient Instructions (Addendum)
Try to get back in the swing of things with exercising.   If you do not hear anything about your referrals in the next 1-2 weeks, call our office and ask for an update.  Keep the diet clean.

## 2017-10-11 ENCOUNTER — Telehealth: Payer: Self-pay

## 2017-10-11 NOTE — Telephone Encounter (Signed)
SENT REFERRAL TO SCHEDULING AND FILED NOTES 

## 2017-10-12 ENCOUNTER — Encounter: Payer: Self-pay | Admitting: Family Medicine

## 2017-10-18 ENCOUNTER — Telehealth: Payer: Self-pay

## 2017-10-18 NOTE — Telephone Encounter (Signed)
Copied from [place] [phone]. Topic: Inquiry >> Oct 18, 2017 12:35 PM Oliver Pila B wrote: Reason for CRM: pt's wife called to get a copy of pt's medication list mailed to them; contact pt if needed; pt would like confirmation

## 2017-10-19 ENCOUNTER — Telehealth: Payer: Self-pay | Admitting: Family Medicine

## 2017-10-19 NOTE — Telephone Encounter (Signed)
We have them scanned in. TY.

## 2017-10-19 NOTE — Telephone Encounter (Signed)
Printed and given to [place] to fax to office in need of.

## 2017-10-19 NOTE — Telephone Encounter (Signed)
Copied from [place] [id number]. Topic: General - Other >> Oct 19, 2017 12:10 PM Carolyn Stare wrote:  Pt is looking for records that was sent to the office by his previous urologist. Pt has an appt with a urologist and they are needing a copy of the records. All of the pt previous   Records were sent to the office   [phone]

## 2017-10-22 ENCOUNTER — Telehealth: Payer: Self-pay | Admitting: *Deleted

## 2017-10-22 NOTE — Telephone Encounter (Signed)
Received Medical records from [facility]; forwarded to provider/SLS 08/05

## 2017-10-31 ENCOUNTER — Telehealth: Payer: Self-pay

## 2017-10-31 ENCOUNTER — Encounter: Payer: Self-pay | Admitting: Cardiology

## 2017-10-31 NOTE — Progress Notes (Signed)
Cardiology Office Note   Date:  11/01/2017   ID:  Austin Lloyd, DOB 17-Jun-1929, MRN [id number]  PCP:  Shelda Pal, DO  Cardiologist:   No primary care provider on file. Referring:  Shelda Pal, DO  No chief complaint on file.     History of Present Illness: Austin Lloyd is a 84 y.o. male who presents for follow up of a complicated cardiac history as described below.  He has CAD/CABG, a reduced EF and mild valve disease.   I went through hundreds of pages of outside records for this appt and the chart was updated.  He has CAD as listed below.  He has an EF of 40% on echo.  (It previously had been 15 - 20%)     Echo 05/2017 with moderate AS and mild to moderate MR.    He moved with his daughter and his wife here as his daughter got a new job.  He is establishing with new physicians.  He actually does quite well.  He does a little walking leaving his apartment to go walk a fair distance to get coffee or haircut.  He does get some shortness of breath if he has to walk quickly or up an incline but this is baseline.  He denies any chest pressure, neck or arm discomfort.  Not had any palpitations, presyncope or syncope.  He never really had chest pain prior to his bypass.  He is had no fevers or chills.  He has no edema PND or orthopnea.   Past Medical History:  Diagnosis Date  . Aortic stenosis   . Bladder cancer ([place])    T1 status post BCG  . 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 Coronary artery disease with history of myocardial infarction without history of CABG    LIMA to the LAD, SVG to RCA 2016 for treatment of the percent left main stenosis and 80% RCA stenosis.    . Depression   . Diabetes mellitus, type II ([place])   . Hypertension   . Ischemic cardiomyopathy    EF 40% 2019  . Lung cancer ([place])    Adenocarcinoma of the right lung moderately differentiated  . PVC's  (premature ventricular contractions)     Past Surgical History:  Procedure Laterality Date  . CORONARY ARTERY BYPASS GRAFT    . INGUINAL HERNIA REPAIR    . LUNG REMOVAL, PARTIAL    . Ulcer surgery       Current Outpatient Medications  Medication Sig Dispense Refill  . aspirin EC 81 MG tablet Take 81 mg by mouth daily.    . furosemide (LASIX) 20 MG tablet Take 20 mg by mouth daily.    Marland Kitchen glucose blood (ACCU-CHEK ACTIVE STRIPS) test strip Use as directed to check blood sugar twice daily    . insulin detemir (LEVEMIR) 100 UNIT/ML injection Inject 30-35 Units into the skin daily.    . Insulin Pen Needle 31G X 8 MM MISC by Does not apply route.    . iron polysaccharides (NIFEREX) 150 MG capsule Take 150 mg by mouth daily.    . Lancets MISC Use three times daily to check blood sugar.    . losartan (COZAAR) 25 MG tablet Take 25 mg by mouth daily.    Marland Kitchen lovastatin (MEVACOR) 40 MG tablet Take 40 mg by mouth at bedtime.    . Melatonin 5 MG TABS Take by mouth.    Marland Kitchen  metoprolol succinate (TOPROL-XL) 25 MG 24 hr tablet Take 25 mg by mouth 2 (two) times daily.    . Multiple Vitamin (MULTIVITAMIN) tablet Take 1 tablet by mouth daily.    . Multiple Vitamins-Minerals (CENTRUM SILVER PO) Take by mouth.    . tamsulosin (FLOMAX) 0.4 MG CAPS capsule Take 0.4 mg by mouth daily.     No current facility-administered medications for this visit.     Allergies:   Patient has no known allergies.    Social History:  The patient  reports that he has quit smoking. He has never used smokeless tobacco. He reports that he drinks about 2.0 standard drinks of alcohol per week. He reports that he does not use drugs.   Family History:  The patient's family history is not on file.    ROS:  Please see the history of present illness.   Otherwise, review of systems are positive for none.   All other systems are reviewed and negative.    PHYSICAL EXAM: VS:  BP 115/64 (BP Location: Left Arm)   Pulse 74   Ht 5\' 4"   (1.626 m)   Wt 153 lb (69.4 kg)   BMI 26.26 kg/m  , BMI Body mass index is 26.26 kg/m. GENERAL:  Well appearing HEENT:  Pupils equal round and reactive, fundi not visualized, oral mucosa unremarkable NECK:  No jugular venous distention, waveform within normal limits, carotid upstroke brisk and symmetric, no bruits, no thyromegaly LYMPHATICS:  No cervical, inguinal adenopathy LUNGS:  Clear to auscultation bilaterally BACK:  No CVA tenderness CHEST:  Well healed sternotomy scar. HEART:  PMI not displaced or sustained,S1 and S2 within normal limits, no S3, no S4, no clicks, no rubs, 3 out of 6 apical systolic murmur radiating slightly at the aortic outflow tract, no diastolic murmurs ABD:  Flat, positive bowel sounds normal in frequency in pitch, no bruits, no rebound, no guarding, no midline pulsatile mass, no hepatomegaly, no splenomegaly EXT:  2 plus pulses throughout, no edema, no cyanosis no clubbing SKIN:  No rashes no nodules NEURO:  Cranial nerves II through XII grossly intact, motor grossly intact throughout PSYCH:  Cognitively intact, oriented to person place and time    EKG:  EKG is ordered today. The ekg ordered today demonstrates sinus rhythm, rate 74, axis within normal limits intervals within normal limits, no acute ST-T wave changes.11/01/2017   Recent Labs: No results found for requested labs within last 8760 hours.    Lipid Panel No results found for: CHOL, TRIG, HDL, CHOLHDL, VLDL, LDLCALC, LDLDIRECT    Wt Readings from Last 3 Encounters:  11/01/17 153 lb (69.4 kg)  10/08/17 153 lb 6 oz (69.6 kg)      Other studies Reviewed: Additional studies/ records that were reviewed today include: Extensive review of outside records.  (Greater than 60 minutes reviewing all data with greater than 50% face to face with the patient). Review of the above records demonstrates:  Please see elsewhere in the note.     ASSESSMENT AND PLAN:  AS:  Mod.  I will follow this  clinically.   He has no symptoms associated with this.    MR:  Mild.  I will follow this clinically.    CAD/CABG:  He is having no pain.  Continue with risk reduction.   ISCHEMIC CARDIOMYOPATHY:    I will assess his EF.  Likely I will titrate meds after this evaluation.    CKD III:    I will assess this with  a BMET.    PVCs:   He had a monitor in Dec of last year.  He is managed medically for NSVT longest run 12 beats.  No change in therapy or further testing is indicated.    Current medicines are reviewed at length with the patient today.  The patient does not have concerns regarding medicines.  The following changes have been made:  no change  Labs/ tests ordered today include:   Orders Placed This Encounter  Procedures  . CBC  . Comprehensive Metabolic Panel (CMET)  . TSH  . Lipid panel  . EKG 12-Lead  . ECHOCARDIOGRAM COMPLETE     Disposition:   FU with me in 3 months.      Signed, [name], MD  11/01/2017 3:18 PM    [facility]

## 2017-10-31 NOTE — Telephone Encounter (Signed)
NOTES FAX TO [facility]

## 2017-11-01 ENCOUNTER — Encounter: Payer: Self-pay | Admitting: Cardiology

## 2017-11-01 ENCOUNTER — Ambulatory Visit (INDEPENDENT_AMBULATORY_CARE_PROVIDER_SITE_OTHER): Payer: Medicare Other | Admitting: Cardiology

## 2017-11-01 VITALS — BP 115/64 | HR 74 | Ht 64.0 in | Wt 153.0 lb

## 2017-11-01 DIAGNOSIS — I429 Cardiomyopathy, unspecified: Secondary | ICD-10-CM

## 2017-11-01 DIAGNOSIS — Z79899 Other long term (current) drug therapy: Secondary | ICD-10-CM | POA: Diagnosis not present

## 2017-11-01 DIAGNOSIS — R5383 Other fatigue: Secondary | ICD-10-CM

## 2017-11-01 DIAGNOSIS — E785 Hyperlipidemia, unspecified: Secondary | ICD-10-CM

## 2017-11-01 DIAGNOSIS — I2510 Atherosclerotic heart disease of native coronary artery without angina pectoris: Secondary | ICD-10-CM | POA: Diagnosis not present

## 2017-11-01 DIAGNOSIS — R531 Weakness: Secondary | ICD-10-CM | POA: Insufficient documentation

## 2017-11-01 DIAGNOSIS — I10 Essential (primary) hypertension: Secondary | ICD-10-CM | POA: Diagnosis not present

## 2017-11-01 DIAGNOSIS — I350 Nonrheumatic aortic (valve) stenosis: Secondary | ICD-10-CM | POA: Diagnosis not present

## 2017-11-01 NOTE — Patient Instructions (Signed)
Medication Instructions:  Continue current medications  If you need a refill on your cardiac medications before your next appointment, please call your pharmacy.  Labwork: CBC, CMP, TSH, Fasting Lipids HERE IN OUR OFFICE AT [facility]  Take the provided lab slips with you to the lab for your blood draw.   You will need to fast. DO NOT EAT OR DRINK PAST MIDNIGHT.   Testing/Procedures: Your physician has requested that you have an echocardiogram. Echocardiography is a painless test that uses sound waves to create images of your heart. It provides your doctor with information about the size and shape of your heart and how well your heart's chambers and valves are working. This procedure takes approximately one hour. There are no restrictions for this procedure.  Follow-Up: Your physician wants you to follow-up in: 3 Months.      Thank you for choosing [facility] at [facility]!!

## 2017-11-06 ENCOUNTER — Ambulatory Visit (HOSPITAL_COMMUNITY): Payer: Medicare Other | Attending: Cardiovascular Disease

## 2017-11-06 ENCOUNTER — Telehealth: Payer: Self-pay | Admitting: Hematology

## 2017-11-06 ENCOUNTER — Other Ambulatory Visit: Payer: Self-pay

## 2017-11-06 ENCOUNTER — Encounter: Payer: Self-pay | Admitting: Hematology

## 2017-11-06 ENCOUNTER — Other Ambulatory Visit: Payer: Medicare Other

## 2017-11-06 DIAGNOSIS — I429 Cardiomyopathy, unspecified: Secondary | ICD-10-CM

## 2017-11-06 DIAGNOSIS — E785 Hyperlipidemia, unspecified: Secondary | ICD-10-CM | POA: Diagnosis not present

## 2017-11-06 DIAGNOSIS — I350 Nonrheumatic aortic (valve) stenosis: Secondary | ICD-10-CM

## 2017-11-06 DIAGNOSIS — I10 Essential (primary) hypertension: Secondary | ICD-10-CM | POA: Diagnosis not present

## 2017-11-06 DIAGNOSIS — I080 Rheumatic disorders of both mitral and aortic valves: Secondary | ICD-10-CM | POA: Diagnosis not present

## 2017-11-06 DIAGNOSIS — Z79899 Other long term (current) drug therapy: Secondary | ICD-10-CM | POA: Diagnosis not present

## 2017-11-06 DIAGNOSIS — R5383 Other fatigue: Secondary | ICD-10-CM | POA: Diagnosis not present

## 2017-11-06 LAB — TSH: TSH: 2.5 u[IU]/mL (ref 0.450–4.500)

## 2017-11-06 LAB — COMPREHENSIVE METABOLIC PANEL
ALT: 22 IU/L (ref 0–44)
AST: 27 IU/L (ref 0–40)
Albumin/Globulin Ratio: 1.9 (ref 1.2–2.2)
Albumin: 4.1 g/dL (ref 3.5–4.7)
Alkaline Phosphatase: 102 IU/L (ref 39–117)
BUN/Creatinine Ratio: 22 (ref 10–24)
BUN: 33 mg/dL — AB (ref 8–27)
Bilirubin Total: 0.6 mg/dL (ref 0.0–1.2)
CALCIUM: 9.2 mg/dL (ref 8.6–10.2)
CO2: 24 mmol/L (ref 20–29)
CREATININE: 1.47 mg/dL — AB (ref 0.76–1.27)
Chloride: 101 mmol/L (ref 96–106)
GFR calc Af Amer: 50 mL/min/{1.73_m2} — ABNORMAL LOW (ref >59)
GFR, EST NON AFRICAN AMERICAN: 43 mL/min/{1.73_m2} — AB (ref >59)
Globulin, Total: 2.2 g/dL (ref 1.5–4.5)
Glucose: 82 mg/dL (ref 65–99)
Potassium: 4.5 mmol/L (ref 3.5–5.2)
Sodium: 140 mmol/L (ref 134–144)
TOTAL PROTEIN: 6.3 g/dL (ref 6.0–8.5)

## 2017-11-06 LAB — CBC
Hematocrit: 38.2 % (ref 37.5–51.0)
Hemoglobin: 12.9 g/dL — ABNORMAL LOW (ref 13.0–17.7)
MCH: 29.2 pg (ref 26.6–33.0)
MCHC: 33.8 g/dL (ref 31.5–35.7)
MCV: 86 fL (ref 79–97)
PLATELETS: 199 10*3/uL (ref 150–450)
RBC: 4.42 x10E6/uL (ref 4.14–5.80)
RDW: 15.6 % — AB (ref 12.3–15.4)
WBC: 8.9 10*3/uL (ref 3.4–10.8)

## 2017-11-06 LAB — LIPID PANEL
Chol/HDL Ratio: 3.2 ratio (ref 0.0–5.0)
Cholesterol, Total: 183 mg/dL (ref 100–199)
HDL: 58 mg/dL (ref >39)
LDL CALC: 108 mg/dL — AB (ref 0–99)
TRIGLYCERIDES: 86 mg/dL (ref 0–149)
VLDL CHOLESTEROL CAL: 17 mg/dL (ref 5–40)

## 2017-11-06 NOTE — Telephone Encounter (Signed)
New referral received from Dr. [name] for the pt to establish care for hx of CLL. Pt has been scheduled to see [name] on 9/11 at 11am. Pt aware to arrive 30 minutes early. Letter mailed.

## 2017-11-12 ENCOUNTER — Encounter: Payer: Self-pay | Admitting: Family Medicine

## 2017-11-12 ENCOUNTER — Ambulatory Visit (INDEPENDENT_AMBULATORY_CARE_PROVIDER_SITE_OTHER): Payer: Medicare Other | Admitting: Family Medicine

## 2017-11-12 VITALS — BP 128/72 | HR 90 | Temp 98.3°F | Ht 64.0 in | Wt 154.1 lb

## 2017-11-12 DIAGNOSIS — R05 Cough: Secondary | ICD-10-CM | POA: Diagnosis not present

## 2017-11-12 DIAGNOSIS — R059 Cough, unspecified: Secondary | ICD-10-CM

## 2017-11-12 MED ORDER — LEVOCETIRIZINE DIHYDROCHLORIDE 5 MG PO TABS: 5.0000 mg | ORAL_TABLET | Freq: Every evening | ORAL | 0 refills | Status: DC

## 2017-11-12 MED ORDER — AZITHROMYCIN 250 MG PO TABS: ORAL_TABLET | ORAL | 0 refills | Status: DC

## 2017-11-12 NOTE — Progress Notes (Signed)
Pre visit review using our clinic review tool, if applicable. No additional management support is needed unless otherwise documented below in the visit note. 

## 2017-11-12 NOTE — Patient Instructions (Addendum)
Claritin (loratadine), Allegra (fexofenadine), Zyrtec (cetirizine); these are listed in order from weakest to strongest. Generic, and therefore cheaper, options are in the parentheses.   Flonase (fluticasone); nasal spray that is over the counter. 2 sprays each nostril, once daily. Aim towards the same side eye when you spray.  There are available OTC, and the generic versions, which may be cheaper, are in parentheses. Show this to a pharmacist if you have trouble finding any of these items.  Do not use azithromycin unless not getting better in the next 3 days.  Continue to push fluids, practice good hand hygiene, and cover your mouth if you cough.  If you start having fevers, shaking or shortness of breath, seek immediate care.  Let us know if you need anything.

## 2017-11-12 NOTE — Progress Notes (Signed)
Chief Complaint  Patient presents with  . Cough    Austin Lloyd here for URI complaints.  Duration: 3 weeks  Associated symptoms:Dry cough, sometimes productive in morning Denies: sinus congestion, sinus pain, rhinorrhea, itchy watery eyes, ear pain, ear drainage, sore throat, wheezing, shortness of breath, myalgia and fevers Treatment to date: Mucinex, Nyquil, Delsym Recently moved from [place] around 1 month ago, no history of allergies though. Sick contacts: No  ROS:  Const: Denies fevers HEENT: As noted in HPI Lungs: No SOB  Past Medical History:  Diagnosis Date  . Aortic stenosis   . Bladder cancer ([place])    T1 status post BCG  . 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 Coronary artery disease with history of myocardial infarction without history of CABG    LIMA to the LAD, SVG to RCA 2016 for treatment of the percent left main stenosis and 80% RCA stenosis.    . Depression   . Diabetes mellitus, type II ([place])   . Hypertension   . Ischemic cardiomyopathy    EF 40% 2019  . Lung cancer ([place])    Adenocarcinoma of the right lung moderately differentiated  . PVC's (premature ventricular contractions)     BP 128/72 (BP Location: Left Arm, Patient Position: Sitting, Cuff Size: Normal)   Pulse 90   Temp 98.3 F (36.8 C) (Oral)   Ht 5\' 4"  (1.626 m)   Wt 154 lb 2 oz (69.9 kg)   SpO2 98%   BMI 26.46 kg/m  General: Awake, alert, appears stated age HEENT: AT, [place], ears patent b/l and TM's neg, nares patent w/o discharge, pharynx pink and without exudates, MMM Neck: No masses or asymmetry Heart: RRR Lungs: CTAB, no accessory muscle use Psych: Age appropriate judgment and insight, normal mood and affect  Cough - Plan: levocetirizine (XYZAL) 5 MG tablet, azithromycin (ZITHROMAX) 250 MG tablet  Orders as above.  Treat for allergies first.  If no improvement over the next 3 days, okay to take  antibiotic. Continue to push fluids, practice good hand hygiene, cover mouth when coughing. F/u prn. If starting to experience fevers, shaking, or shortness of breath, seek immediate care. Pt voiced understanding and agreement to the plan.  [place], DO 11/12/17 11:19 AM

## 2017-11-20 DIAGNOSIS — Z23 Encounter for immunization: Secondary | ICD-10-CM | POA: Diagnosis not present

## 2017-11-23 DIAGNOSIS — L84 Corns and callosities: Secondary | ICD-10-CM | POA: Diagnosis not present

## 2017-11-23 DIAGNOSIS — E1159 Type 2 diabetes mellitus with other circulatory complications: Secondary | ICD-10-CM | POA: Diagnosis not present

## 2017-11-23 DIAGNOSIS — L602 Onychogryphosis: Secondary | ICD-10-CM | POA: Diagnosis not present

## 2017-11-26 DIAGNOSIS — C61 Malignant neoplasm of prostate: Secondary | ICD-10-CM | POA: Diagnosis not present

## 2017-11-26 DIAGNOSIS — Z8551 Personal history of malignant neoplasm of bladder: Secondary | ICD-10-CM | POA: Diagnosis not present

## 2017-11-28 ENCOUNTER — Inpatient Hospital Stay: Payer: Medicare Other

## 2017-11-28 ENCOUNTER — Telehealth: Payer: Self-pay

## 2017-11-28 ENCOUNTER — Inpatient Hospital Stay: Payer: Medicare Other | Attending: Hematology | Admitting: Hematology

## 2017-11-28 VITALS — BP 137/77 | HR 74 | Temp 97.9°F | Resp 18 | Ht 64.0 in | Wt 151.2 lb

## 2017-11-28 DIAGNOSIS — Z85828 Personal history of other malignant neoplasm of skin: Secondary | ICD-10-CM | POA: Diagnosis not present

## 2017-11-28 DIAGNOSIS — C9110 Chronic lymphocytic leukemia of B-cell type not having achieved remission: Secondary | ICD-10-CM | POA: Insufficient documentation

## 2017-11-28 DIAGNOSIS — Z794 Long term (current) use of insulin: Secondary | ICD-10-CM | POA: Insufficient documentation

## 2017-11-28 DIAGNOSIS — C679 Malignant neoplasm of bladder, unspecified: Secondary | ICD-10-CM

## 2017-11-28 DIAGNOSIS — I130 Hypertensive heart and chronic kidney disease with heart failure and stage 1 through stage 4 chronic kidney disease, or unspecified chronic kidney disease: Secondary | ICD-10-CM | POA: Diagnosis not present

## 2017-11-28 DIAGNOSIS — C61 Malignant neoplasm of prostate: Secondary | ICD-10-CM | POA: Insufficient documentation

## 2017-11-28 DIAGNOSIS — Z79899 Other long term (current) drug therapy: Secondary | ICD-10-CM | POA: Diagnosis not present

## 2017-11-28 DIAGNOSIS — C3490 Malignant neoplasm of unspecified part of unspecified bronchus or lung: Secondary | ICD-10-CM

## 2017-11-28 DIAGNOSIS — N183 Chronic kidney disease, stage 3 (moderate): Secondary | ICD-10-CM | POA: Insufficient documentation

## 2017-11-28 DIAGNOSIS — Z9221 Personal history of antineoplastic chemotherapy: Secondary | ICD-10-CM | POA: Insufficient documentation

## 2017-11-28 DIAGNOSIS — I252 Old myocardial infarction: Secondary | ICD-10-CM

## 2017-11-28 DIAGNOSIS — Z85118 Personal history of other malignant neoplasm of bronchus and lung: Secondary | ICD-10-CM | POA: Insufficient documentation

## 2017-11-28 DIAGNOSIS — Z7982 Long term (current) use of aspirin: Secondary | ICD-10-CM | POA: Diagnosis not present

## 2017-11-28 DIAGNOSIS — Z87891 Personal history of nicotine dependence: Secondary | ICD-10-CM | POA: Diagnosis not present

## 2017-11-28 DIAGNOSIS — E1122 Type 2 diabetes mellitus with diabetic chronic kidney disease: Secondary | ICD-10-CM | POA: Diagnosis not present

## 2017-11-28 DIAGNOSIS — Z923 Personal history of irradiation: Secondary | ICD-10-CM | POA: Insufficient documentation

## 2017-11-28 LAB — CMP (CANCER CENTER ONLY)
ALBUMIN: 3.3 g/dL — AB (ref 3.5–5.0)
ALT: 23 U/L (ref 0–44)
ANION GAP: 9 (ref 5–15)
AST: 28 U/L (ref 15–41)
Alkaline Phosphatase: 108 U/L (ref 38–126)
BILIRUBIN TOTAL: 0.6 mg/dL (ref 0.3–1.2)
BUN: 39 mg/dL — ABNORMAL HIGH (ref 8–23)
CO2: 27 mmol/L (ref 22–32)
Calcium: 9.7 mg/dL (ref 8.9–10.3)
Chloride: 105 mmol/L (ref 98–111)
Creatinine: 1.71 mg/dL — ABNORMAL HIGH (ref 0.61–1.24)
GFR, EST NON AFRICAN AMERICAN: 35 mL/min — AB (ref >60)
GFR, Est AFR Am: 40 mL/min — ABNORMAL LOW (ref >60)
GLUCOSE: 100 mg/dL — AB (ref 70–99)
POTASSIUM: 5 mmol/L (ref 3.5–5.1)
Sodium: 141 mmol/L (ref 135–145)
TOTAL PROTEIN: 6.8 g/dL (ref 6.5–8.1)

## 2017-11-28 LAB — CBC WITH DIFFERENTIAL/PLATELET
BASOS ABS: 0 10*3/uL (ref 0.0–0.1)
Basophils Relative: 1 %
Eosinophils Absolute: 0.2 10*3/uL (ref 0.0–0.5)
Eosinophils Relative: 3 %
HEMATOCRIT: 38.5 % (ref 38.4–49.9)
HEMOGLOBIN: 12.7 g/dL — AB (ref 13.0–17.1)
LYMPHS PCT: 30 %
Lymphs Abs: 2.5 10*3/uL (ref 0.9–3.3)
MCH: 29.5 pg (ref 27.2–33.4)
MCHC: 33 g/dL (ref 32.0–36.0)
MCV: 89.3 fL (ref 79.3–98.0)
Monocytes Absolute: 0.6 10*3/uL (ref 0.1–0.9)
Monocytes Relative: 7 %
NEUTROS ABS: 5 10*3/uL (ref 1.5–6.5)
NEUTROS PCT: 59 %
Platelets: 204 10*3/uL (ref 140–400)
RBC: 4.31 MIL/uL (ref 4.20–5.82)
RDW: 14.7 % — ABNORMAL HIGH (ref 11.0–14.6)
WBC: 8.3 10*3/uL (ref 4.0–10.3)

## 2017-11-28 LAB — LACTATE DEHYDROGENASE: LDH: 254 U/L — ABNORMAL HIGH (ref 98–192)

## 2017-11-28 NOTE — Telephone Encounter (Signed)
Printed avs and calender of upcoming appointment. Per 9/11 los 

## 2017-11-28 NOTE — Progress Notes (Signed)
Cardiology Office Note   Date:  11/28/2017   ID:  Austin Lloyd, DOB June 14, 1929, MRN [id number]  PCP:  Shelda Pal, DO  Cardiologist:   No primary care provider on file. Referring:  Shelda Pal, DO  No chief complaint on file.     History of Present Illness: Austin Lloyd is a 84 y.o. male who presents for follow up of a complicated cardiac history as described below.  He has CAD/CABG, a reduced EF and mild valve disease.  He has CAD as listed below.  He has an EF of 40% on echo.  (It previously had been 15 - 20%)     Echo 05/2017 with moderate AS and mild to moderate MR.  When I saw him recently he had an echo that was found to have apparent severe AS.    I brought them back to discuss this.  He has his regimen of walking around near where he lives in the morning and getting his coffee.  He does get dyspneic if he walks up an incline or tries to hurry.  He is not describing resting shortness of breath, PND or orthopnea.  He is not having palpitations, presyncope or syncope.  He is not having any chest pressure, neck or arm discomfort.  I have personally reviewed his echo films and he has a very heavily calcified valve with severe aortic stenosis and a moderately reduced ejection fraction.    Past Medical History:  Diagnosis Date  . Aortic stenosis   . Bladder cancer ([place])    T1 status post BCG  . 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 Coronary artery disease with history of myocardial infarction without history of CABG    LIMA to the LAD, SVG to RCA 2016 for treatment of the percent left main stenosis and 80% RCA stenosis.    . Depression   . Diabetes mellitus, type II ([place])   . Hypertension   . Ischemic cardiomyopathy    EF 40% 2019  . 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 PVC's (premature ventricular contractions)     Past  Surgical History:  Procedure Laterality Date  . CORONARY ARTERY BYPASS GRAFT    . INGUINAL HERNIA REPAIR    . LUNG REMOVAL, PARTIAL    . Ulcer surgery       Current Outpatient Medications  Medication Sig Dispense Refill  . aspirin EC 81 MG tablet Take 81 mg by mouth daily.    Marland Kitchen azithromycin (ZITHROMAX) 250 MG tablet Take 2 tabs the first day and then 1 tab daily until you run out. 6 tablet 0  . furosemide (LASIX) 20 MG tablet Take 20 mg by mouth daily.    Marland Kitchen glucose blood (ACCU-CHEK ACTIVE STRIPS) test strip Use as directed to check blood sugar twice daily    . insulin detemir (LEVEMIR) 100 UNIT/ML injection Inject 30-35 Units into the skin daily.    . Insulin Pen Needle 31G X 8 MM MISC by Does not apply route.    . iron polysaccharides (NIFEREX) 150 MG capsule Take 150 mg by mouth daily.    . Lancets MISC Use three times daily to check blood sugar.    . levocetirizine (XYZAL) 5 MG tablet Take 1 tablet (5 mg total) by mouth every evening. 30 tablet 0  . losartan (COZAAR) 25 MG tablet Take 25 mg by mouth  daily.    . lovastatin (MEVACOR) 40 MG tablet Take 40 mg by mouth at bedtime.    . Melatonin 5 MG TABS Take by mouth.    . metoprolol succinate (TOPROL-XL) 25 MG 24 hr tablet Take 25 mg by mouth 2 (two) times daily.    . Multiple Vitamin (MULTIVITAMIN) tablet Take 1 tablet by mouth daily.    . Multiple Vitamins-Minerals (CENTRUM SILVER PO) Take by mouth.    . tamsulosin (FLOMAX) 0.4 MG CAPS capsule Take 0.4 mg by mouth daily.     No current facility-administered medications for this visit.     Allergies:   Patient has no known allergies.    ROS:  Please see the history of present illness.   Otherwise, review of systems are positive for none.   All other systems are reviewed and negative.    PHYSICAL EXAM: VS:  There were no vitals taken for this visit. , BMI There is no height or weight on file to calculate BMI. GENERAL:  Well appearing but slightly frail NECK:  No jugular  venous distention, waveform within normal limits, carotid upstroke brisk and symmetric, no bruits, no thyromegaly LUNGS:  Clear to auscultation bilaterally CHEST:  Unremarkable HEART:  PMI not displaced or sustained,S1 and S2 within normal limits, no S3, no S4, no clicks, no rubs, 3 out of 6 mid-to-late peaking systolic murmur radiating slightly at the aortic outflow tract murmurs ABD:  Flat, positive bowel sounds normal in frequency in pitch, no bruits, no rebound, no guarding, no midline pulsatile mass, no hepatomegaly, no splenomegaly EXT:  2 plus pulses throughout, no edema, no cyanosis no clubbing    EKG:  EKG is not ordered today.    Recent Labs: 11/06/2017: TSH 2.500 11/28/2017: ALT 23; BUN 39; Creatinine 1.71; Hemoglobin 12.7; Platelets 204; Potassium 5.0; Sodium 141    Lipid Panel    Component Value Date/Time   CHOL 183 11/06/2017 0815   TRIG 86 11/06/2017 0815   HDL 58 11/06/2017 0815   CHOLHDL 3.2 11/06/2017 0815   LDLCALC 108 (H) 11/06/2017 0815      Wt Readings from Last 3 Encounters:  11/28/17 151 lb 3.2 oz (68.6 kg)  11/12/17 154 lb 2 oz (69.9 kg)  11/01/17 153 lb (69.4 kg)      Other studies Reviewed: Additional studies/ records that were reviewed today include: Echo images  ASSESSMENT AND PLAN:  AS:  Severe.  It is difficult to this joint has symptomatic he is as he has had lung surgery.  I suspect he is symptomatic with this heavily calcified valve.  Consideration could be given to TAVR.  This was the discussion with the patient prior to him moving from [place].  They would like to consider this option and further imaging might be indicated.  He is at a peak in his medical situation actually.  Of note I do have a note outstanding to his hematology oncology physician to understand exactly the prognosis and plans for follow-up of his lung cancer and his CLL.  I suspect neither of these would be prohibitive to any necessary develops procedure.  They would like  to have consultation with 1 of our cardiovascular surgeons.  MR:   This was moderate.  I would manage this conservatively.  CAD/CABG:    He is not having any angina.  He certainly would need imaging of this if he was to have a valve surgery and he does have an elevated creatinine so would need to repeat  be met prior to any catheterization.  ISCHEMIC CARDIOMYOPATHY:     I will continue medications as listed.  CKD III:    Creatinine is actually better than it was previously at 1.47.  No change in therapy.  PVCs:   He had this on monitor last year is not having any symptoms.  He has had nonsustained ventricular tachycardia.  No change in therapy.  Current medicines are reviewed at length with the patient today.  The patient does not have concerns regarding medicines.  The following changes have been made:  None  Labs/ tests ordered today include: None  No orders of the defined types were placed in this encounter.    Disposition:   FU with me in after surgical consultation.   Signed, [name], MD  11/28/2017 9:23 PM    [place]

## 2017-11-28 NOTE — Progress Notes (Signed)
HEMATOLOGY/ONCOLOGY CONSULTATION NOTE  Date of Service: 11/28/2017  Patient Care Team: Shelda Pal, DO as PCP - General (Family Medicine)  CHIEF COMPLAINTS/PURPOSE OF CONSULTATION:  History of CLL and NSCLC  Oncologic History:   Austin Lloyd was diagnosed with CLL in 2012 and was treated with one cycle on Bendamustine and possibly Rituxan (incomplete records) before developing acute kidney injury and then completing 5 cycles of Gazyva and Chlorambucil.   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 Treated from 06/13/16 to 06/23/16 with stereotactic body radiotherapy to right upper lobe, total dose of 5000cGy in 5 fractions   Bladder cancer prior to 2010. Low grade prostate cancer currently.   HISTORY OF PRESENTING ILLNESS:   Austin Lloyd is a wonderful 84 y.o. male who has been referred to [place] by Dr. Riki Sheer for evaluation and management of history of CLL and non-small cell lung cancer. He is accompanied today by his wife. The pt reports that he is doing well overall and has enjoyed his recent move from [place].   The pt notes that he has had stable energy levels. He notes that he has not had any changes in breathing and has not developed any constitutional symptom, denying fevers, chills, night sweats and unexpected weight loss. The pt notes that he has not been able to walk as far as he used to and has to sit down often.   The pt reports that his elevated WBC was incidentally picked up while living in [place] in 2012. The pt notes that his treatment with Bendamustine, and possibly Rituxan, was complicated by CHF and acute kidney injury and he notes that he had two infusions at the time.   The pt notes that his lung cancer was  initially diagnosed in 2015 and was resected. It then recurred in 2018 and was treated with radiation.   The pt has had three squamous cell carcinomas, one under left eye, one under left lear, and on occipital scalp. He also has had a basal cell carcinoma. He will be establishing care with dermatology in October.  The pt notes that his bladder cancer and prostate cancer has received BCG and denies radiation to his prostate. He has begun care with urology Dr. Louis Meckel.   He has just begun seeing Dr. [name] in Cardiology as well for his history of CHF, CAD, and aortic stenosis. The pt had an MI in 2015 with subsequent CABG x2.   Most recent lab results (11/06/17) of CBC and CMP is as follows: all values are WNL except for HGB at 12.9, RDW at 15.6, BUN at 33, Creatinine at 1.47, GFR at 43.  On review of systems, pt reports stable energy levels, and denies fevers, chills, night sweats, unexpected weight loss, noticing any new lumps or bumps, changes in breathing, blood in the urine, abdominal pains, chest pain, and any other symptoms.   On PMHx the pt reports DM type II, CKD stage III, CAD s/p two vessel CABG, CLL, invasive adenocarcinoma. 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 Basal cell carcinoma, high grade bladder cancer, low grade prostate cancer, MI in June 2015, acute decompensated heart failure. On Social Hx the pt reports quit smoking cigarettes more than 45 years ago   MEDICAL HISTORY:  Past Medical History:  Diagnosis Date  . Aortic stenosis   . Bladder cancer ([place])    T1 status post BCG  . 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 Coronary artery disease with history of myocardial infarction without history of CABG    LIMA to the LAD, SVG to RCA 2016 for treatment of the percent left main stenosis and 80% RCA stenosis.    . Depression   . Diabetes mellitus, type II  ([place])   . Hypertension   . Ischemic cardiomyopathy    EF 40% 2019  . Lung cancer ([place])    Adenocarcinoma of the right lung moderately differentiated  . PVC's (premature ventricular contractions)     SURGICAL HISTORY: Past Surgical History:  Procedure Laterality Date  . CORONARY ARTERY BYPASS GRAFT    . INGUINAL HERNIA REPAIR    . LUNG REMOVAL, PARTIAL    . Ulcer surgery      SOCIAL HISTORY: Social History   Socioeconomic History  . Marital status: Married    Spouse name: Not on file  . Number of children: Not on file  . Years of education: Not on file  . Highest education level: Not on file  Occupational History  . Occupation: Sales executive  Social Needs  . Financial resource strain: Not on file  . Food insecurity:    Worry: Not on file    Inability: Not on file  . Transportation needs:    Medical: Not on file    Non-medical: Not on file  Tobacco Use  . Smoking status: Former Research scientist (life sciences)  . Smokeless tobacco: Never Used  Substance and Sexual Activity  . Alcohol use: Yes    Alcohol/week: 2.0 standard drinks    Types: 2 Cans of beer per week    Comment: 2 cans beer per week.  . Drug use: Never  . Sexual activity: Not on file  Lifestyle  . Physical activity:    Days per week: Not on file    Minutes per session: Not on file  . Stress: Not on file  Relationships  . Social connections:    Talks on phone: Not on file    Gets together: Not on file    Attends religious service: Not on file    Active member of club or organization: Not on file    Attends meetings of clubs or organizations: Not on file    Relationship status: Not on file  . Intimate partner violence:    Fear of current or ex partner: Not on file    Emotionally abused: Not on file    Physically abused: Not on file    Forced sexual activity: Not on file  Other Topics Concern  . Not on file  Social History Narrative   Lives with wife daughter.  Three children and one grandchild.     FAMILY  HISTORY: No family history on file.  ALLERGIES:  has No Known Allergies.  MEDICATIONS:  Current Outpatient Medications  Medication Sig Dispense Refill  . aspirin EC 81 MG tablet Take 81 mg by mouth daily.    Marland Kitchen azithromycin (ZITHROMAX) 250 MG tablet Take 2 tabs the first day and then 1 tab daily until you run out. 6 tablet 0  . furosemide (LASIX) 20 MG tablet Take 20 mg by mouth daily.    Marland Kitchen glucose blood (ACCU-CHEK ACTIVE STRIPS) test strip Use as directed to check blood sugar twice daily    .  insulin detemir (LEVEMIR) 100 UNIT/ML injection Inject 30-35 Units into the skin daily.    . Insulin Pen Needle 31G X 8 MM MISC by Does not apply route.    . iron polysaccharides (NIFEREX) 150 MG capsule Take 150 mg by mouth daily.    . Lancets MISC Use three times daily to check blood sugar.    . levocetirizine (XYZAL) 5 MG tablet Take 1 tablet (5 mg total) by mouth every evening. 30 tablet 0  . losartan (COZAAR) 25 MG tablet Take 25 mg by mouth daily.    Marland Kitchen lovastatin (MEVACOR) 40 MG tablet Take 40 mg by mouth at bedtime.    . Melatonin 5 MG TABS Take by mouth.    . metoprolol succinate (TOPROL-XL) 25 MG 24 hr tablet Take 25 mg by mouth 2 (two) times daily.    . Multiple Vitamin (MULTIVITAMIN) tablet Take 1 tablet by mouth daily.    . Multiple Vitamins-Minerals (CENTRUM SILVER PO) Take by mouth.    . tamsulosin (FLOMAX) 0.4 MG CAPS capsule Take 0.4 mg by mouth daily.     No current facility-administered medications for this visit.     REVIEW OF SYSTEMS:    10 Point review of Systems was done is negative except as noted above.  PHYSICAL EXAMINATION: ECOG PERFORMANCE STATUS: 2 - Symptomatic, <50% confined to bed  . Vitals:   11/28/17 1140  BP: 137/77  Pulse: 74  Resp: 18  Temp: 97.9 F (36.6 C)  SpO2: 94%   Filed Weights   11/28/17 1140  Weight: 151 lb 3.2 oz (68.6 kg)   .Body mass index is 25.95 kg/m.  GENERAL:alert, in no acute distress and comfortable SKIN: no acute rashes,  no significant lesions EYES: conjunctiva are pink and non-injected, sclera anicteric OROPHARYNX: MMM, no exudates, no oropharyngeal erythema or ulceration NECK: supple, no JVD LYMPH:  no palpable lymphadenopathy in the cervical, axillary or inguinal regions LUNGS: clear to auscultation b/l with normal respiratory effort HEART: regular rate & rhythm, 3/6 SMaorticarea. ABDOMEN:  normoactive bowel sounds , non tender, not distended. Extremity: no pedal edema PSYCH: alert & oriented x 3 with fluent speech NEURO: no focal motor/sensory deficits  LABORATORY DATA:  I have reviewed the data as listed  . CBC Latest Ref Rng & Units 11/28/2017 11/06/2017  WBC 4.0 - 10.3 K/uL 8.3 8.9  Hemoglobin 13.0 - 17.1 g/dL 12.7(L) 12.9(L)  Hematocrit 38.4 - 49.9 % 38.5 38.2  Platelets 140 - 400 K/uL 204 199   . CBC    Component Value Date/Time   WBC 8.3 11/28/2017 1254   RBC 4.31 11/28/2017 1254   HGB 12.7 (L) 11/28/2017 1254   HGB 12.9 (L) 11/06/2017 0815   HCT 38.5 11/28/2017 1254   HCT 38.2 11/06/2017 0815   PLT 204 11/28/2017 1254   PLT 199 11/06/2017 0815   MCV 89.3 11/28/2017 1254   MCV 86 11/06/2017 0815   MCH 29.5 11/28/2017 1254   MCHC 33.0 11/28/2017 1254   RDW 14.7 (H) 11/28/2017 1254   RDW 15.6 (H) 11/06/2017 0815   LYMPHSABS 2.5 11/28/2017 1254   MONOABS 0.6 11/28/2017 1254   EOSABS 0.2 11/28/2017 1254   BASOSABS 0.0 11/28/2017 1254    . CMP Latest Ref Rng & Units 11/28/2017 11/06/2017  Glucose 70 - 99 mg/dL 100(H) 82  BUN 8 - 23 mg/dL 39(H) 33(H)  Creatinine 0.61 - 1.24 mg/dL 1.71(H) 1.47(H)  Sodium 135 - 145 mmol/L 141 140  Potassium 3.5 - 5.1 mmol/L 5.0  4.5  Chloride 98 - 111 mmol/L 105 101  CO2 22 - 32 mmol/L 27 24  Calcium 8.9 - 10.3 mg/dL 9.7 9.2  Total Protein 6.5 - 8.1 g/dL 6.8 6.3  Total Bilirubin 0.3 - 1.2 mg/dL 0.6 0.6  Alkaline Phos 38 - 126 U/L 108 102  AST 15 - 41 U/L 28 27  ALT 0 - 44 U/L 23 22     RADIOGRAPHIC STUDIES: I have personally reviewed the  radiological images as listed and agreed with the findings in the report. No results found.   08/04/17 CT Chest:    ASSESSMENT & PLAN:  84 y.o. male with  1. History of CLL 03/2013 treated with Bendamustine and possibly Rituxan (outside records incomplete) but discontinued after 1 dose due to acute kidney injury 04/2013 to 08/2013 treated with 5 cycles of Gazyva and Chlorambucil  04/2015 FISH was without chromosomal abnormalities  Plan PLAN -labs from 9/1 reviewed no lymphocytosis. No anemia or thrombocytopenia. -no constitutional sypmtoms. -no overt indication for additional treatment of patients CLL at this time.  2. History of Lung Cancer Invasive adenocarcinoma of the right upper lung in 2015, moderately differentiated, initially Stage IB with visceral pleural invasion but resected with clear margins and no lymph node involvement 04/2016 Biopsy confirmed right upper lobe recurrence of non-small cell cancer. Treated with stereotactic body radiotherapy to right upper lobe , total dose of 5000cGy in 5 fractions  01/2017 New right lower lobe nodule initially concerning for recurrence, but then resolved spontaneously Plan F/u Ct chest requested in 3-4 months prior to f/u.  3. Prostate cancer low grade Has maintained surveillance with urology and every 6 month PSA checks -on lupron with urology  4. History of high grade bladder cancer Continue follow up with urology  5. History of Squamous cell carcinoma x3 and Basal cell carcinoma -continue f/u with dermatology  6. CKD Stage III - stable . Continue f/u with PCP  PLAN: -Discussed patient's most recent labs from 11/06/17, HGB at 12.9 -Continue follow up with urologist Dr. Louis Meckel for prostate and bladder cancer management  -Continue follow up with Dr. [name] in cardiology -Will order labs today as noted below - reviewed  -Will order CT Chest  -The pt shows no clinical or lab progression of CLL at this time.    -No indication for further treatment at this time.    7. H/o CAD, CHF, with mod-severe Aortic Stenosis.modMR -continue f/u with cardiology for continued Mx.  . Orders Placed This Encounter  Procedures  . CT Chest Wo Contrast    Standing Status:   Future    Standing Expiration Date:   11/28/2018    Order Specific Question:   ** REASON FOR EXAM (FREE TEXT)    Answer:   h/o Stage I non small cell lung cancer s/p wed ge resection and SRS for surveillance/followup    Order Specific Question:   Preferred imaging location?    Answer:   [facility]    Order Specific Question:   Radiology Contrast Protocol - do NOT remove file path    Answer:   [email or web address]  . CBC with Differential/Platelet    Standing Status:   Future    Standing Expiration Date:   01/02/2019  . CMP ([place] only)    Standing Status:   Future    Standing Expiration Date:   11/29/2018  . Lactate dehydrogenase    Standing Status:   Future    Standing Expiration Date:  11/29/2018  . CBC with Differential/Platelet    Standing Status:   Future    Number of Occurrences:   1    Standing Expiration Date:   01/02/2019  . CMP ([place] only)    Standing Status:   Future    Number of Occurrences:   1    Standing Expiration Date:   11/29/2018  . Lactate dehydrogenase    Standing Status:   Future    Number of Occurrences:   1    Standing Expiration Date:   11/29/2018    Labs today Ct chest wo contrast in 16 weeks RTC with dr Irene Limbo in 4 months with labs   All of the patients questions were answered with apparent satisfaction. The patient knows to call the clinic with any problems, questions or concerns.  The total time spent in the appt was 60 minutes and more than 50% was on counseling and direct patient cares.    Sullivan Lone MD MS AAHIVMS [facility] Hematology/Oncology Physician [facility]  (Office):       [phone] (Work cell):  [phone] (Fax):            [phone]  11/28/2017 12:32 PM  I, Baldwin [place], am acting as a scribe for [name]  .I have reviewed the above documentation for accuracy and completeness, and I agree with the above. Brunetta Genera MD

## 2017-11-29 ENCOUNTER — Ambulatory Visit (INDEPENDENT_AMBULATORY_CARE_PROVIDER_SITE_OTHER): Payer: Medicare Other | Admitting: Cardiology

## 2017-11-29 ENCOUNTER — Encounter: Payer: Self-pay | Admitting: Cardiology

## 2017-11-29 VITALS — BP 110/82 | HR 72 | Ht 64.0 in | Wt 149.0 lb

## 2017-11-29 DIAGNOSIS — I2510 Atherosclerotic heart disease of native coronary artery without angina pectoris: Secondary | ICD-10-CM | POA: Diagnosis not present

## 2017-11-29 DIAGNOSIS — I350 Nonrheumatic aortic (valve) stenosis: Secondary | ICD-10-CM

## 2017-11-29 NOTE — Patient Instructions (Addendum)
Medication Instructions:  Your physician recommends that you continue on your current medications as directed. Please refer to the Current Medication list given to you today.  Labwork: None ordered  Testing/Procedures: None ordered  Follow-Up: Your physician wants you to follow-up in: 4 months with Dr.Hochrein You will receive a reminder letter in the mail two months in advance. If you don't receive a letter, please call our office to schedule the follow-up appointment.  You have been referred to [facility] for TAVR consideration  Any Other Special Instructions Will Be Listed Below (If Applicable).     If you need a refill on your cardiac medications before your next appointment, please call your pharmacy.

## 2017-12-04 ENCOUNTER — Ambulatory Visit: Payer: Medicare Other | Admitting: Cardiology

## 2017-12-13 ENCOUNTER — Encounter: Payer: Self-pay | Admitting: Cardiovascular Disease

## 2017-12-17 ENCOUNTER — Ambulatory Visit (INDEPENDENT_AMBULATORY_CARE_PROVIDER_SITE_OTHER): Payer: Medicare Other | Admitting: Cardiovascular Disease

## 2017-12-17 ENCOUNTER — Encounter: Payer: Self-pay | Admitting: Cardiovascular Disease

## 2017-12-17 VITALS — BP 160/90 | HR 74 | Ht 64.0 in | Wt 154.8 lb

## 2017-12-17 DIAGNOSIS — I2510 Atherosclerotic heart disease of native coronary artery without angina pectoris: Secondary | ICD-10-CM

## 2017-12-17 DIAGNOSIS — I350 Nonrheumatic aortic (valve) stenosis: Secondary | ICD-10-CM | POA: Diagnosis not present

## 2017-12-17 NOTE — H&P (View-Only) (Signed)
Chief Complaint  Patient presents with  . Follow-up    Aortic Valve Stenosis    History of Present Illness: 84 yo male (pronounced Deeann Cree) with history of bladder cancer, lung cancer, prostate cancer, chronic lymphocytic leukemia, CAD s/p 2V CABG in 2016, diabetes, HTN, ischemic cardiomyopathy, lung cancer, PVCs and severe aortic stenosis who is here today as a new consult for further discussion regarding his aortic stenosis and possible TAVR. He has been followed in our office by [name] since August of 2019 when he moved to our area to be near his daughter. He is known to have aortic stenosis. Most recent echo 11/06/17 with LVEF=40-45% with severe hypokinesis of the inferior, basal mid-inferolateral walls. There was grade 2 diastolic dysfunction. The aortic valve leaflets are thickened and calcified with restricted motion. Mean gradient 26 mmHg. Dimensional index 0.2. AVA 0.89cm2. There was moderate mitral regurgitation. He has CAD and had CABG in 2016 (LIMA to LAD, SVG to RCA). Records state that his LVEF had been as low as 15%. He had chemotherapy for his CLL. His lung cancer was treated with radiation. His prostate cancer has not been treated.   He describes dyspnea with exertion. This has been progressing for the past few months. He has fatigue. He denies chest pain, lower extremity edema, dizziness. 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 He lives with his wife and daughter in his daughter's home. He has full dentures.  Primary Care Physician: Shelda Pal, DO  Primary Cardiologist: [name] Referring Cardiologist: [name]   Past Medical History:  Diagnosis Date  . Aortic stenosis   . Bladder cancer ([place])    T1 status post BCG  . 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 Coronary artery disease with history of myocardial infarction without history of CABG    LIMA to the LAD,  SVG to RCA 2016 for treatment of the percent left main stenosis and 80% RCA stenosis.    . Depression   . Diabetes mellitus, type II ([place])   . Hypertension   . Ischemic cardiomyopathy    EF 40% 2019  . Lung cancer ([place])    Adenocarcinoma of the right lung moderately differentiated  . PVC's (premature ventricular contractions)     Past Surgical History:  Procedure Laterality Date  . CORONARY ARTERY BYPASS GRAFT    . INGUINAL HERNIA REPAIR    . LUNG REMOVAL, PARTIAL    . Ulcer surgery      Current Outpatient Medications  Medication Sig Dispense Refill  . aspirin EC 81 MG tablet Take 81 mg by mouth daily.    Marland Kitchen azithromycin (ZITHROMAX) 250 MG tablet Take 2 tabs the first day and then 1 tab daily until you run out. 6 tablet 0  . furosemide (LASIX) 20 MG tablet Take 20 mg by mouth daily.    Marland Kitchen glucose blood (ACCU-CHEK ACTIVE STRIPS) test strip Use as directed to check blood sugar twice daily    . insulin detemir (LEVEMIR) 100 UNIT/ML injection Inject 30-35 Units into the skin daily.    . Insulin Pen Needle 31G X 8 MM MISC by Does not apply route.    . iron polysaccharides (NIFEREX) 150 MG capsule Take 150 mg by mouth daily.    . Lancets MISC Use three times daily to check blood sugar.    . levocetirizine (XYZAL) 5 MG tablet Take 1 tablet (5 mg total) by  mouth every evening. 30 tablet 0  . losartan (COZAAR) 25 MG tablet Take 25 mg by mouth daily.    Marland Kitchen lovastatin (MEVACOR) 40 MG tablet Take 40 mg by mouth at bedtime.    . Melatonin 5 MG TABS Take by mouth.    . metoprolol succinate (TOPROL-XL) 25 MG 24 hr tablet Take 25 mg by mouth 2 (two) times daily.    . Multiple Vitamin (MULTIVITAMIN) tablet Take 1 tablet by mouth daily.    . Multiple Vitamins-Minerals (CENTRUM SILVER PO) Take by mouth.    . tamsulosin (FLOMAX) 0.4 MG CAPS capsule Take 0.4 mg by mouth daily.     No current facility-administered medications for this visit.     No Known Allergies  Social History   Socioeconomic  History  . Marital status: Married    Spouse name: Not on file  . Number of children: 3  . Years of education: Not on file  . Highest education level: Not on file  Occupational History  . 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 Financial resource strain: Not on file  . Food insecurity:    Worry: Not on file    Inability: Not on file  . Transportation needs:    Medical: Not on file    Non-medical: Not on file  Tobacco Use  . Smoking status: Former Smoker    Packs/day: 0.20    Years: 30.00    Pack years: 6.00    Types: Cigarettes  . Smokeless tobacco: Never Used  Substance and Sexual Activity  . Alcohol use: Yes    Alcohol/week: 2.0 standard drinks    Types: 2 Cans of beer per week    Comment: 2 cans beer per week.  . Drug use: Never  . Sexual activity: Not on file  Lifestyle  . Physical activity:    Days per week: Not on file    Minutes per session: Not on file  . Stress: Not on file  Relationships  . Social connections:    Talks on phone: Not on file    Gets together: Not on file    Attends religious service: Not on file    Active member of club or organization: Not on file    Attends meetings of clubs or organizations: Not on file    Relationship status: Not on file  . Intimate partner violence:    Fear of current or ex partner: Not on file    Emotionally abused: Not on file    Physically abused: Not on file    Forced sexual activity: Not on file  Other Topics Concern  . Not on file  Social History Narrative   Lives with wife daughter.  Three children and one grandchild.     Family History  Problem Relation Age of Onset  . AAA (abdominal aortic aneurysm) Mother   . Cancer Father        Brain tumor    Review of Systems:  As stated in the HPI and otherwise negative.   BP (!) 160/90   Pulse 74   Ht 5\' 4"  (1.626 m)   Wt 154 lb 12.8 oz (70.2 kg)   SpO2 98%   BMI 26.57 kg/m   Physical Examination: General:  Well developed, well nourished, NAD  HEENT: OP clear, mucus membranes moist  SKIN: warm, dry. No rashes. Neuro: No focal deficits  Musculoskeletal: Muscle strength 5/5 all ext  Psychiatric: Mood and affect normal  Neck: No JVD, no carotid bruits, no thyromegaly, no lymphadenopathy.  Lungs:Clear bilaterally, no wheezes, rhonci, crackles Cardiovascular: Regular rate and rhythm. Loud, harsh systolic murmur.  Abdomen:Soft. Bowel sounds present. Non-tender.  Extremities: No lower extremity edema. Pulses are 2 + in the bilateral DP/PT.  Echo August 2019: Left ventricle: The cavity size was mildly dilated. Wall   thickness was normal. Systolic function was mildly to moderately   reduced. The estimated ejection fraction was in the range of 40%   to 45%. Severe hypokinesis of the basal-midinferolateral,   inferior, and inferoseptal myocardium; consistent with infarction   in the distribution of the right coronary artery. Features are   consistent with a pseudonormal left ventricular filling pattern,   with concomitant abnormal relaxation and increased filling   pressure (grade 2 diastolic dysfunction). - Aortic valve: Right coronary cusp mobility was severely   restricted. Transvalvular velocity was increased less than   expected, due to low cardiac output. There was moderate to severe   stenosis. The aortic jet remains early peaking, suggesting   non-critical stenosis. There was mild regurgitation. - Mitral valve: Calcified annulus. There was moderate regurgitation   directed eccentrically and posteriorly. - Left atrium: The atrium was moderately dilated. - Right ventricle: The cavity size was moderately dilated. Systolic   function was moderately reduced. - Right atrium: The atrium was moderately dilated.  ------------------------------------------------------------------- Study data:  No prior study was available for comparison.  Study status:  Routine.  Procedure:  The patient  reported no pain pre or post test. Transthoracic echocardiography for left ventricular function evaluation and for assessment of valvular function. Image quality was adequate.  Study completion:  There were no complications.          Transthoracic echocardiography.  M-mode, complete 2D, spectral Doppler, and color Doppler.  Birthdate: Patient birthdate: 1929/12/10.  Age:  Patient is 84 yr old.  Sex: Gender: male.    BMI: 26.2 kg/m^2.  Blood pressure:     115/64 Patient status:  Outpatient.  Study date:  Study date: 11/06/2017. Study time: 08:37 AM.  Location:  [facility]  -------------------------------------------------------------------  ------------------------------------------------------------------- Left ventricle:  The cavity size was mildly dilated. Wall thickness was normal. Systolic function was mildly to moderately reduced. The estimated ejection fraction was in the range of 40% to 45%. Regional wall motion abnormalities:   Severe hypokinesis of the basal-midinferolateral, inferior, and inferoseptal myocardium; consistent with infarction in the distribution of the right coronary artery. Features are consistent with a pseudonormal left ventricular filling pattern, with concomitant abnormal relaxation and increased filling pressure (grade 2 diastolic dysfunction).  ------------------------------------------------------------------- Aortic valve:   Trileaflet; severely thickened, moderately calcified leaflets.  Right coronary cusp mobility was severely restricted.  Doppler:  Transvalvular velocity was increased less than expected, due to low cardiac output. There was moderate to severe stenosis. There was mild regurgitation.    VTI ratio of LVOT to aortic valve: 0.2. Valve area (VTI): 0.93 cm^2. Indexed valve area (VTI): 0.52 cm^2/m^2. Peak velocity ratio of LVOT to aortic valve: 0.2. Valve area (Vmax): 0.93 cm^2. Indexed valve area (Vmax): 0.52 cm^2/m^2. Mean  velocity ratio of LVOT to aortic valve: 0.19. Valve area (Vmean): 0.89 cm^2. Indexed valve area (Vmean): 0.5 cm^2/m^2.    Mean gradient (S): 26 mm Hg. Peak gradient (S): 48 mm Hg.  ------------------------------------------------------------------- Aorta:  Aortic root: The aortic root was normal in size. Ascending aorta: The ascending aorta was normal in size.  ------------------------------------------------------------------- Mitral valve:   Calcified annulus.  Leaflet separation was normal. Doppler:  Transvalvular velocity was within the normal range. There was no evidence for stenosis. There was moderate regurgitation directed eccentrically and posteriorly.    Valve area by pressure half-time: 3.86 cm^2. Indexed valve area by pressure half-time: 2.16 cm^2/m^2.    Peak gradient (D): 6 mm Hg.  ------------------------------------------------------------------- Left atrium:  The atrium was moderately dilated.  ------------------------------------------------------------------- Right ventricle:  The cavity size was moderately dilated. Systolic function was moderately reduced.  ------------------------------------------------------------------- Pulmonic valve:   Poorly visualized.  The valve appears to be grossly normal.    Doppler:  Transvalvular velocity was within the normal range. There was no evidence for stenosis. There was no significant regurgitation.  ------------------------------------------------------------------- Tricuspid valve:   Structurally normal valve.   Leaflet separation was normal.  Doppler:  Transvalvular velocity was within the normal range. There was no regurgitation.  ------------------------------------------------------------------- Pulmonary artery:    Systolic pressure could not be accurately estimated.  ------------------------------------------------------------------- Right atrium:  The atrium was moderately  dilated.  ------------------------------------------------------------------- Pericardium:  There was no pericardial effusion.  ------------------------------------------------------------------- Systemic veins: Inferior vena cava: The vessel was normal in size. The respirophasic diameter changes were in the normal range (>= 50%), consistent with normal central venous pressure.  ------------------------------------------------------------------- Measurements   Left ventricle                           Value          Reference  LV ID, ED, PLAX chordal          (H)     59    mm       43 - 52  LV ID, ES, PLAX chordal          (H)     52    mm       23 - 38  LV fx shortening, PLAX chordal   (L)     12    %        >=29  LV PW thickness, ED                      10    mm       ----------  IVS/LV PW ratio, ED                      1.1            <=1.3  Stroke volume, 2D                        69    ml       ----------  Stroke volume/bsa, 2D                    39    ml/m^2   ----------    Ventricular septum                       Value          Reference  IVS thickness, ED                        11    mm       ----------    LVOT  Value          Reference  LVOT ID, S                               24.3  mm       ----------  LVOT area                                4.64  cm^2     ----------  LVOT ID                                  24    mm       ----------  LVOT peak velocity, S                    68.43 cm/s     ----------  LVOT mean velocity, S                    45.9  cm/s     ----------  LVOT VTI, S                              15.79 cm       ----------  Stroke volume (SV), LVOT DP              73.2  ml       ----------  Stroke index (SV/bsa), LVOT DP           41    ml/m^2   ----------    Aortic valve                             Value          Reference  Aortic valve peak velocity, S            347   cm/s     ----------  Aortic valve mean velocity, S             240   cm/s     ----------  Aortic valve VTI, S                      80.2  cm       ----------  Aortic mean gradient, S                  26    mm Hg    ----------  Aortic peak gradient, S                  48    mm Hg    ----------  VTI ratio, LVOT/AV                       0.2            ----------  Aortic valve area, VTI                   0.93  cm^2     ----------  Aortic valve area/bsa, VTI               0.52  cm^2/m^2 ----------  Velocity ratio, peak, LVOT/AV  0.2            ----------  Aortic valve area, peak velocity         0.93  cm^2     ----------  Aortic valve area/bsa, peak              0.52  cm^2/m^2 ----------  velocity  Velocity ratio, mean, LVOT/AV            0.19           ----------  Aortic valve area, mean velocity         0.89  cm^2     ----------  Aortic valve area/bsa, mean              0.5   cm^2/m^2 ----------  velocity  Aortic regurg pressure half-time         510   ms       ----------    Aorta                                    Value          Reference  Aortic root ID, ED                       33    mm       ----------  Ascending aorta ID, A-P, S               37    mm       ----------    Left atrium                              Value          Reference  LA ID, A-P, ES                           46    mm       ----------  LA ID/bsa, A-P                   (H)     2.58  cm/m^2   <=2.2  LA volume, S                             67.6  ml       ----------  LA volume/bsa, S                         37.9  ml/m^2   ----------  LA volume, ES, 1-p A4C                   57.9  ml       ----------  LA volume/bsa, ES, 1-p A4C               32.4  ml/m^2   ----------  LA volume, ES, 1-p A2C                   69.7  ml       ----------  LA volume/bsa, ES, 1-p A2C               39    ml/m^2   ----------  Mitral valve                             Value          Reference  Mitral E-wave peak velocity              127   cm/s     ----------  Mitral A-wave peak  velocity              72.7  cm/s     ----------  Mitral deceleration time                 185   ms       150 - 230  Mitral pressure half-time                54    ms       ----------  Mitral peak gradient, D                  6     mm Hg    ----------  Mitral E/A ratio, peak                   2              ----------  Mitral valve area, PHT, DP               3.86  cm^2     ----------  Mitral valve area/bsa, PHT, DP           2.16  cm^2/m^2 ----------    Right atrium                             Value          Reference  RA ID, S-I, ES, A4C              (H)     58.7  mm       34 - 49  RA area, ES, A4C                 (H)     20.4  cm^2     8.3 - 19.5  RA volume, ES, A/L                       60.1  ml       ----------  RA volume/bsa, ES, A/L                   33.7  ml/m^2   ----------    Systemic veins                           Value          Reference  Estimated CVP                            3     mm Hg    ----------    Right ventricle                          Value          Reference  RV ID, minor axis, ED, A4C       (H)  47.86 mm       26 - 43  EKG:  EKG is ordered today. The ekg ordered today demonstrates NSR, rate 70 bpm.   Recent Labs: 11/06/2017: TSH 2.500 11/28/2017: ALT 23; BUN 39; Creatinine 1.71; Hemoglobin 12.7; Platelets 204; Potassium 5.0; Sodium 141   Lipid Panel    Component Value Date/Time   CHOL 183 11/06/2017 0815   TRIG 86 11/06/2017 0815   HDL 58 11/06/2017 0815   CHOLHDL 3.2 11/06/2017 0815   LDLCALC 108 (H) 11/06/2017 0815     Wt Readings from Last 3 Encounters:  12/17/17 154 lb 12.8 oz (70.2 kg)  11/29/17 149 lb (67.6 kg)  11/28/17 151 lb 3.2 oz (68.6 kg)     Other studies Reviewed: Additional studies/ records that were reviewed today include: echo images, EKG, office records Review of the above records demonstrates: severe AS   Assessment and Plan:   1. Severe aortic stenosis: He has severe, stage D aortic valve stenosis. I have personally  reviewed the echo images. The aortic valve is thickened, calcified with limited leaflet mobility. I think he would benefit from AVR. Given advanced age, he is not a good candidate for conventional AVR by surgical approach. I think he may be a good candidate for TAVR.   STS Risk Score:  Procedure: Isolated AVR  Risk of Mortality: 8.754%  Renal Failure: 8.562%  Permanent Stroke: 2.652%  Prolonged Ventilation: 23.126%  DSW Infection: 0.277%  Reoperation: 4.423%  Morbidity or Mortality: 30.969%  Short Length of Stay: 15.907%  Long Length of Stay: 21.228%   I have reviewed the natural history of aortic stenosis with the patient and their family members  who are present today. We have discussed the limitations of medical therapy and the poor prognosis associated with symptomatic aortic stenosis. We have reviewed potential treatment options, including palliative medical therapy, conventional surgical aortic valve replacement, and transcatheter aortic valve replacement. We discussed treatment options in the context of the patient's specific comorbid medical conditions.   He would like to proceed with planning for TAVR. I will arrange a right and left heart catheterization at [facility] 12/21/17. Risks and benefits of the cath procedure and the TAVR procedure reviewed with the patient. After the cath, he will have a cardiac CT, CTA of the chest/abdomen and pelvis, PFTs, carotid dopplers and PT assessment and will then be referred to see one of the CT surgeons on our TAVR team.    Current medicines are reviewed at length with the patient today.  The patient does not have concerns regarding medicines.  The following changes have been made:  no change  Labs/ tests ordered today include:   Orders Placed This Encounter  Procedures  . Basic Metabolic Panel (BMET)  . CBC  . EKG 12-Lead     Disposition:   FU with the TAVR team    Signed, Lauree Chandler, MD 12/17/2017 1:38 PM    [facility] [place], [place],   [address] Phone: [phone]; Fax: [phone]

## 2017-12-17 NOTE — Patient Instructions (Addendum)
Medication Instructions:  Your physician recommends that you continue on your current medications as directed. Please refer to the Current Medication list given to you today.   Labwork: Lab work to be done today--BMP and CBC  Testing/Procedures: Your physician has requested that you have a cardiac catheterization. Cardiac catheterization is used to diagnose and/or treat various heart conditions. Doctors may recommend this procedure for a number of different reasons. The most common reason is to evaluate chest pain. Chest pain can be a symptom of coronary artery disease (CAD), and cardiac catheterization can show whether plaque is narrowing or blocking your heart's arteries. This procedure is also used to evaluate the valves, as well as measure the blood flow and oxygen levels in different parts of your heart. For further information please visit [email or web address]. Please follow instruction sheet, as given.  Scheduled for October 4,2019  Follow-Up: To be arranged after catheterization  Any Other Special Instructions Will Be Listed Below (If Applicable).     [facility] OFFICE [place], [place]  [place] [address] Dept: [phone] Loc: [place]  12/17/2017  You are scheduled for a Cardiac Catheterization on Friday, October 4 with Dr. Lauree Chandler.  1. Please arrive at the [facility] (Main Entrance A) at [facility]: [address] [place], [place] [address] at 5:30 AM (This time is 5 hours before your procedure to ensure your preparation). Free valet parking service is available.   Special note: Every effort is made to have your procedure done on time. Please understand that emergencies sometimes delay scheduled procedures.  2. Diet: Do not eat solid foods after midnight.  The patient may have clear liquids until 5am upon the day of the procedure.  3.  Labs: Lab work was done in office on 12/17/17  4. Medication instructions in preparation for your procedure:   Contrast Allergy: No  Do not take furosemide and losartan the day before and day or procedure. Do not take insulin the morning of the procedure.   On the morning of your procedure, take your Aspirin and any morning medicines NOT listed above.  You may use sips of water.  5. Plan for one night stay--bring personal belongings. 6. Bring a current list of your medications and current insurance cards. 7. You MUST have a responsible person to drive you home. 8. Someone MUST be with you the first 24 hours after you arrive home or your discharge will be delayed. 9. Please wear clothes that are easy to get on and off and wear slip-on shoes.  Thank you for allowing [place] to care for you!   -- [facility]    If you need a refill on your cardiac medications before your next appointment, please call your pharmacy.

## 2017-12-17 NOTE — Progress Notes (Signed)
Chief Complaint  Patient presents with  . Follow-up    Aortic Valve Stenosis    History of Present Illness: 84 yo male (pronounced Deeann Cree) with history of bladder cancer, lung cancer, prostate cancer, chronic lymphocytic leukemia, CAD s/p 2V CABG in 2016, diabetes, HTN, ischemic cardiomyopathy, lung cancer, PVCs and severe aortic stenosis who is here today as a new consult for further discussion regarding his aortic stenosis and possible TAVR. He has been followed in our office by [name] since August of 2019 when he moved to our area to be near his daughter. He is known to have aortic stenosis. Most recent echo 11/06/17 with LVEF=40-45% with severe hypokinesis of the inferior, basal mid-inferolateral walls. There was grade 2 diastolic dysfunction. The aortic valve leaflets are thickened and calcified with restricted motion. Mean gradient 26 mmHg. Dimensional index 0.2. AVA 0.89cm2. There was moderate mitral regurgitation. He has CAD and had CABG in 2016 (LIMA to LAD, SVG to RCA). Records state that his LVEF had been as low as 15%. He had chemotherapy for his CLL. His lung cancer was treated with radiation. His prostate cancer has not been treated.   He describes dyspnea with exertion. This has been progressing for the past few months. He has fatigue. He denies chest pain, lower extremity edema, dizziness. 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 He lives with his wife and daughter in his daughter's home. He has full dentures.  Primary Care Physician: Shelda Pal, DO  Primary Cardiologist: [name] Referring Cardiologist: [name]   Past Medical History:  Diagnosis Date  . Aortic stenosis   . Bladder cancer ([place])    T1 status post BCG  . 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 Coronary artery disease with history of myocardial infarction without history of CABG    LIMA to the LAD,  SVG to RCA 2016 for treatment of the percent left main stenosis and 80% RCA stenosis.    . Depression   . Diabetes mellitus, type II ([place])   . Hypertension   . Ischemic cardiomyopathy    EF 40% 2019  . Lung cancer ([place])    Adenocarcinoma of the right lung moderately differentiated  . PVC's (premature ventricular contractions)     Past Surgical History:  Procedure Laterality Date  . CORONARY ARTERY BYPASS GRAFT    . INGUINAL HERNIA REPAIR    . LUNG REMOVAL, PARTIAL    . Ulcer surgery      Current Outpatient Medications  Medication Sig Dispense Refill  . aspirin EC 81 MG tablet Take 81 mg by mouth daily.    Marland Kitchen azithromycin (ZITHROMAX) 250 MG tablet Take 2 tabs the first day and then 1 tab daily until you run out. 6 tablet 0  . furosemide (LASIX) 20 MG tablet Take 20 mg by mouth daily.    Marland Kitchen glucose blood (ACCU-CHEK ACTIVE STRIPS) test strip Use as directed to check blood sugar twice daily    . insulin detemir (LEVEMIR) 100 UNIT/ML injection Inject 30-35 Units into the skin daily.    . Insulin Pen Needle 31G X 8 MM MISC by Does not apply route.    . iron polysaccharides (NIFEREX) 150 MG capsule Take 150 mg by mouth daily.    . Lancets MISC Use three times daily to check blood sugar.    . levocetirizine (XYZAL) 5 MG tablet Take 1 tablet (5 mg total) by  mouth every evening. 30 tablet 0  . losartan (COZAAR) 25 MG tablet Take 25 mg by mouth daily.    Marland Kitchen lovastatin (MEVACOR) 40 MG tablet Take 40 mg by mouth at bedtime.    . Melatonin 5 MG TABS Take by mouth.    . metoprolol succinate (TOPROL-XL) 25 MG 24 hr tablet Take 25 mg by mouth 2 (two) times daily.    . Multiple Vitamin (MULTIVITAMIN) tablet Take 1 tablet by mouth daily.    . Multiple Vitamins-Minerals (CENTRUM SILVER PO) Take by mouth.    . tamsulosin (FLOMAX) 0.4 MG CAPS capsule Take 0.4 mg by mouth daily.     No current facility-administered medications for this visit.     No Known Allergies  Social History   Socioeconomic  History  . Marital status: Married    Spouse name: Not on file  . Number of children: 3  . Years of education: Not on file  . Highest education level: Not on file  Occupational History  . 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 Financial resource strain: Not on file  . Food insecurity:    Worry: Not on file    Inability: Not on file  . Transportation needs:    Medical: Not on file    Non-medical: Not on file  Tobacco Use  . Smoking status: Former Smoker    Packs/day: 0.20    Years: 30.00    Pack years: 6.00    Types: Cigarettes  . Smokeless tobacco: Never Used  Substance and Sexual Activity  . Alcohol use: Yes    Alcohol/week: 2.0 standard drinks    Types: 2 Cans of beer per week    Comment: 2 cans beer per week.  . Drug use: Never  . Sexual activity: Not on file  Lifestyle  . Physical activity:    Days per week: Not on file    Minutes per session: Not on file  . Stress: Not on file  Relationships  . Social connections:    Talks on phone: Not on file    Gets together: Not on file    Attends religious service: Not on file    Active member of club or organization: Not on file    Attends meetings of clubs or organizations: Not on file    Relationship status: Not on file  . Intimate partner violence:    Fear of current or ex partner: Not on file    Emotionally abused: Not on file    Physically abused: Not on file    Forced sexual activity: Not on file  Other Topics Concern  . Not on file  Social History Narrative   Lives with wife daughter.  Three children and one grandchild.     Family History  Problem Relation Age of Onset  . AAA (abdominal aortic aneurysm) Mother   . Cancer Father        Brain tumor    Review of Systems:  As stated in the HPI and otherwise negative.   BP (!) 160/90   Pulse 74   Ht 5\' 4"  (1.626 m)   Wt 154 lb 12.8 oz (70.2 kg)   SpO2 98%   BMI 26.57 kg/m   Physical Examination: General:  Well developed, well nourished, NAD  HEENT: OP clear, mucus membranes moist  SKIN: warm, dry. No rashes. Neuro: No focal deficits  Musculoskeletal: Muscle strength 5/5 all ext  Psychiatric: Mood and affect normal  Neck: No JVD, no carotid bruits, no thyromegaly, no lymphadenopathy.  Lungs:Clear bilaterally, no wheezes, rhonci, crackles Cardiovascular: Regular rate and rhythm. Loud, harsh systolic murmur.  Abdomen:Soft. Bowel sounds present. Non-tender.  Extremities: No lower extremity edema. Pulses are 2 + in the bilateral DP/PT.  Echo August 2019: Left ventricle: The cavity size was mildly dilated. Wall   thickness was normal. Systolic function was mildly to moderately   reduced. The estimated ejection fraction was in the range of 40%   to 45%. Severe hypokinesis of the basal-midinferolateral,   inferior, and inferoseptal myocardium; consistent with infarction   in the distribution of the right coronary artery. Features are   consistent with a pseudonormal left ventricular filling pattern,   with concomitant abnormal relaxation and increased filling   pressure (grade 2 diastolic dysfunction). - Aortic valve: Right coronary cusp mobility was severely   restricted. Transvalvular velocity was increased less than   expected, due to low cardiac output. There was moderate to severe   stenosis. The aortic jet remains early peaking, suggesting   non-critical stenosis. There was mild regurgitation. - Mitral valve: Calcified annulus. There was moderate regurgitation   directed eccentrically and posteriorly. - Left atrium: The atrium was moderately dilated. - Right ventricle: The cavity size was moderately dilated. Systolic   function was moderately reduced. - Right atrium: The atrium was moderately dilated.  ------------------------------------------------------------------- Study data:  No prior study was available for comparison.  Study status:  Routine.  Procedure:  The patient  reported no pain pre or post test. Transthoracic echocardiography for left ventricular function evaluation and for assessment of valvular function. Image quality was adequate.  Study completion:  There were no complications.          Transthoracic echocardiography.  M-mode, complete 2D, spectral Doppler, and color Doppler.  Birthdate: Patient birthdate: 26-Aug-1929.  Age:  Patient is 84 yr old.  Sex: Gender: male.    BMI: 26.2 kg/m^2.  Blood pressure:     115/64 Patient status:  Outpatient.  Study date:  Study date: 11/06/2017. Study time: 08:37 AM.  Location:  [facility]  -------------------------------------------------------------------  ------------------------------------------------------------------- Left ventricle:  The cavity size was mildly dilated. Wall thickness was normal. Systolic function was mildly to moderately reduced. The estimated ejection fraction was in the range of 40% to 45%. Regional wall motion abnormalities:   Severe hypokinesis of the basal-midinferolateral, inferior, and inferoseptal myocardium; consistent with infarction in the distribution of the right coronary artery. Features are consistent with a pseudonormal left ventricular filling pattern, with concomitant abnormal relaxation and increased filling pressure (grade 2 diastolic dysfunction).  ------------------------------------------------------------------- Aortic valve:   Trileaflet; severely thickened, moderately calcified leaflets.  Right coronary cusp mobility was severely restricted.  Doppler:  Transvalvular velocity was increased less than expected, due to low cardiac output. There was moderate to severe stenosis. There was mild regurgitation.    VTI ratio of LVOT to aortic valve: 0.2. Valve area (VTI): 0.93 cm^2. Indexed valve area (VTI): 0.52 cm^2/m^2. Peak velocity ratio of LVOT to aortic valve: 0.2. Valve area (Vmax): 0.93 cm^2. Indexed valve area (Vmax): 0.52 cm^2/m^2. Mean  velocity ratio of LVOT to aortic valve: 0.19. Valve area (Vmean): 0.89 cm^2. Indexed valve area (Vmean): 0.5 cm^2/m^2.    Mean gradient (S): 26 mm Hg. Peak gradient (S): 48 mm Hg.  ------------------------------------------------------------------- Aorta:  Aortic root: The aortic root was normal in size. Ascending aorta: The ascending aorta was normal in size.  ------------------------------------------------------------------- Mitral valve:   Calcified annulus.  Leaflet separation was normal. Doppler:  Transvalvular velocity was within the normal range. There was no evidence for stenosis. There was moderate regurgitation directed eccentrically and posteriorly.    Valve area by pressure half-time: 3.86 cm^2. Indexed valve area by pressure half-time: 2.16 cm^2/m^2.    Peak gradient (D): 6 mm Hg.  ------------------------------------------------------------------- Left atrium:  The atrium was moderately dilated.  ------------------------------------------------------------------- Right ventricle:  The cavity size was moderately dilated. Systolic function was moderately reduced.  ------------------------------------------------------------------- Pulmonic valve:   Poorly visualized.  The valve appears to be grossly normal.    Doppler:  Transvalvular velocity was within the normal range. There was no evidence for stenosis. There was no significant regurgitation.  ------------------------------------------------------------------- Tricuspid valve:   Structurally normal valve.   Leaflet separation was normal.  Doppler:  Transvalvular velocity was within the normal range. There was no regurgitation.  ------------------------------------------------------------------- Pulmonary artery:    Systolic pressure could not be accurately estimated.  ------------------------------------------------------------------- Right atrium:  The atrium was moderately  dilated.  ------------------------------------------------------------------- Pericardium:  There was no pericardial effusion.  ------------------------------------------------------------------- Systemic veins: Inferior vena cava: The vessel was normal in size. The respirophasic diameter changes were in the normal range (>= 50%), consistent with normal central venous pressure.  ------------------------------------------------------------------- Measurements   Left ventricle                           Value          Reference  LV ID, ED, PLAX chordal          (H)     59    mm       43 - 52  LV ID, ES, PLAX chordal          (H)     52    mm       23 - 38  LV fx shortening, PLAX chordal   (L)     12    %        >=29  LV PW thickness, ED                      10    mm       ----------  IVS/LV PW ratio, ED                      1.1            <=1.3  Stroke volume, 2D                        69    ml       ----------  Stroke volume/bsa, 2D                    39    ml/m^2   ----------    Ventricular septum                       Value          Reference  IVS thickness, ED                        11    mm       ----------    LVOT  Value          Reference  LVOT ID, S                               24.3  mm       ----------  LVOT area                                4.64  cm^2     ----------  LVOT ID                                  24    mm       ----------  LVOT peak velocity, S                    68.43 cm/s     ----------  LVOT mean velocity, S                    45.9  cm/s     ----------  LVOT VTI, S                              15.79 cm       ----------  Stroke volume (SV), LVOT DP              73.2  ml       ----------  Stroke index (SV/bsa), LVOT DP           41    ml/m^2   ----------    Aortic valve                             Value          Reference  Aortic valve peak velocity, S            347   cm/s     ----------  Aortic valve mean velocity, S             240   cm/s     ----------  Aortic valve VTI, S                      80.2  cm       ----------  Aortic mean gradient, S                  26    mm Hg    ----------  Aortic peak gradient, S                  48    mm Hg    ----------  VTI ratio, LVOT/AV                       0.2            ----------  Aortic valve area, VTI                   0.93  cm^2     ----------  Aortic valve area/bsa, VTI               0.52  cm^2/m^2 ----------  Velocity ratio, peak, LVOT/AV  0.2            ----------  Aortic valve area, peak velocity         0.93  cm^2     ----------  Aortic valve area/bsa, peak              0.52  cm^2/m^2 ----------  velocity  Velocity ratio, mean, LVOT/AV            0.19           ----------  Aortic valve area, mean velocity         0.89  cm^2     ----------  Aortic valve area/bsa, mean              0.5   cm^2/m^2 ----------  velocity  Aortic regurg pressure half-time         510   ms       ----------    Aorta                                    Value          Reference  Aortic root ID, ED                       33    mm       ----------  Ascending aorta ID, A-P, S               37    mm       ----------    Left atrium                              Value          Reference  LA ID, A-P, ES                           46    mm       ----------  LA ID/bsa, A-P                   (H)     2.58  cm/m^2   <=2.2  LA volume, S                             67.6  ml       ----------  LA volume/bsa, S                         37.9  ml/m^2   ----------  LA volume, ES, 1-p A4C                   57.9  ml       ----------  LA volume/bsa, ES, 1-p A4C               32.4  ml/m^2   ----------  LA volume, ES, 1-p A2C                   69.7  ml       ----------  LA volume/bsa, ES, 1-p A2C               39    ml/m^2   ----------  Mitral valve                             Value          Reference  Mitral E-wave peak velocity              127   cm/s     ----------  Mitral A-wave peak  velocity              72.7  cm/s     ----------  Mitral deceleration time                 185   ms       150 - 230  Mitral pressure half-time                54    ms       ----------  Mitral peak gradient, D                  6     mm Hg    ----------  Mitral E/A ratio, peak                   2              ----------  Mitral valve area, PHT, DP               3.86  cm^2     ----------  Mitral valve area/bsa, PHT, DP           2.16  cm^2/m^2 ----------    Right atrium                             Value          Reference  RA ID, S-I, ES, A4C              (H)     58.7  mm       34 - 49  RA area, ES, A4C                 (H)     20.4  cm^2     8.3 - 19.5  RA volume, ES, A/L                       60.1  ml       ----------  RA volume/bsa, ES, A/L                   33.7  ml/m^2   ----------    Systemic veins                           Value          Reference  Estimated CVP                            3     mm Hg    ----------    Right ventricle                          Value          Reference  RV ID, minor axis, ED, A4C       (H)  47.86 mm       26 - 43  EKG:  EKG is ordered today. The ekg ordered today demonstrates NSR, rate 70 bpm.   Recent Labs: 11/06/2017: TSH 2.500 11/28/2017: ALT 23; BUN 39; Creatinine 1.71; Hemoglobin 12.7; Platelets 204; Potassium 5.0; Sodium 141   Lipid Panel    Component Value Date/Time   CHOL 183 11/06/2017 0815   TRIG 86 11/06/2017 0815   HDL 58 11/06/2017 0815   CHOLHDL 3.2 11/06/2017 0815   LDLCALC 108 (H) 11/06/2017 0815     Wt Readings from Last 3 Encounters:  12/17/17 154 lb 12.8 oz (70.2 kg)  11/29/17 149 lb (67.6 kg)  11/28/17 151 lb 3.2 oz (68.6 kg)     Other studies Reviewed: Additional studies/ records that were reviewed today include: echo images, EKG, office records Review of the above records demonstrates: severe AS   Assessment and Plan:   1. Severe aortic stenosis: He has severe, stage D aortic valve stenosis. I have personally  reviewed the echo images. The aortic valve is thickened, calcified with limited leaflet mobility. I think he would benefit from AVR. Given advanced age, he is not a good candidate for conventional AVR by surgical approach. I think he may be a good candidate for TAVR.   STS Risk Score:  Procedure: Isolated AVR  Risk of Mortality: 8.754%  Renal Failure: 8.562%  Permanent Stroke: 2.652%  Prolonged Ventilation: 23.126%  DSW Infection: 0.277%  Reoperation: 4.423%  Morbidity or Mortality: 30.969%  Short Length of Stay: 15.907%  Long Length of Stay: 21.228%   I have reviewed the natural history of aortic stenosis with the patient and their family members  who are present today. We have discussed the limitations of medical therapy and the poor prognosis associated with symptomatic aortic stenosis. We have reviewed potential treatment options, including palliative medical therapy, conventional surgical aortic valve replacement, and transcatheter aortic valve replacement. We discussed treatment options in the context of the patient's specific comorbid medical conditions.   He would like to proceed with planning for TAVR. I will arrange a right and left heart catheterization at [facility] 12/21/17. Risks and benefits of the cath procedure and the TAVR procedure reviewed with the patient. After the cath, he will have a cardiac CT, CTA of the chest/abdomen and pelvis, PFTs, carotid dopplers and PT assessment and will then be referred to see one of the CT surgeons on our TAVR team.    Current medicines are reviewed at length with the patient today.  The patient does not have concerns regarding medicines.  The following changes have been made:  no change  Labs/ tests ordered today include:   Orders Placed This Encounter  Procedures  . Basic Metabolic Panel (BMET)  . CBC  . EKG 12-Lead     Disposition:   FU with the TAVR team    Signed, Lauree Chandler, MD 12/17/2017 1:38 PM    [facility] [address], [place], [place]  [address] Phone: [phone]; Fax: [phone]

## 2017-12-18 LAB — BASIC METABOLIC PANEL
BUN / CREAT RATIO: 22 (ref 10–24)
BUN: 30 mg/dL — AB (ref 8–27)
CHLORIDE: 103 mmol/L (ref 96–106)
CO2: 24 mmol/L (ref 20–29)
Calcium: 9.3 mg/dL (ref 8.6–10.2)
Creatinine, Ser: 1.39 mg/dL — ABNORMAL HIGH (ref 0.76–1.27)
GFR calc non Af Amer: 46 mL/min/{1.73_m2} — ABNORMAL LOW (ref >59)
GFR, EST AFRICAN AMERICAN: 53 mL/min/{1.73_m2} — AB (ref >59)
GLUCOSE: 135 mg/dL — AB (ref 65–99)
Potassium: 4.9 mmol/L (ref 3.5–5.2)
Sodium: 143 mmol/L (ref 134–144)

## 2017-12-18 LAB — CBC
HEMATOCRIT: 39.4 % (ref 37.5–51.0)
Hemoglobin: 13.1 g/dL (ref 13.0–17.7)
MCH: 29.4 pg (ref 26.6–33.0)
MCHC: 33.2 g/dL (ref 31.5–35.7)
MCV: 88 fL (ref 79–97)
PLATELETS: 206 10*3/uL (ref 150–450)
RBC: 4.46 x10E6/uL (ref 4.14–5.80)
RDW: 15 % (ref 12.3–15.4)
WBC: 9.1 10*3/uL (ref 3.4–10.8)

## 2017-12-19 ENCOUNTER — Other Ambulatory Visit: Payer: Self-pay

## 2017-12-19 DIAGNOSIS — I350 Nonrheumatic aortic (valve) stenosis: Secondary | ICD-10-CM

## 2017-12-19 DIAGNOSIS — R0602 Shortness of breath: Secondary | ICD-10-CM

## 2017-12-21 ENCOUNTER — Ambulatory Visit (HOSPITAL_COMMUNITY)
Admission: RE | Admit: 2017-12-21 | Discharge: 2017-12-21 | Disposition: A | Discharge status: Home / Self Care | Payer: Medicare Other | Source: Ambulatory Visit | Attending: Cardiovascular Disease | Admitting: Cardiovascular Disease

## 2017-12-21 ENCOUNTER — Encounter (HOSPITAL_COMMUNITY)
Admission: RE | Disposition: A | Discharge status: Home / Self Care | Payer: Self-pay | Source: Ambulatory Visit | Attending: Cardiovascular Disease

## 2017-12-21 ENCOUNTER — Other Ambulatory Visit: Payer: Self-pay

## 2017-12-21 DIAGNOSIS — Z923 Personal history of irradiation: Secondary | ICD-10-CM | POA: Insufficient documentation

## 2017-12-21 DIAGNOSIS — E119 Type 2 diabetes mellitus without complications: Secondary | ICD-10-CM | POA: Diagnosis not present

## 2017-12-21 DIAGNOSIS — Z951 Presence of aortocoronary bypass graft: Secondary | ICD-10-CM | POA: Insufficient documentation

## 2017-12-21 DIAGNOSIS — Z7982 Long term (current) use of aspirin: Secondary | ICD-10-CM | POA: Diagnosis not present

## 2017-12-21 DIAGNOSIS — I2510 Atherosclerotic heart disease of native coronary artery without angina pectoris: Secondary | ICD-10-CM | POA: Diagnosis not present

## 2017-12-21 DIAGNOSIS — I255 Ischemic cardiomyopathy: Secondary | ICD-10-CM | POA: Insufficient documentation

## 2017-12-21 DIAGNOSIS — Z9221 Personal history of antineoplastic chemotherapy: Secondary | ICD-10-CM | POA: Diagnosis not present

## 2017-12-21 DIAGNOSIS — F329 Major depressive disorder, single episode, unspecified: Secondary | ICD-10-CM | POA: Diagnosis not present

## 2017-12-21 DIAGNOSIS — Z794 Long term (current) use of insulin: Secondary | ICD-10-CM | POA: Diagnosis not present

## 2017-12-21 DIAGNOSIS — I350 Nonrheumatic aortic (valve) stenosis: Secondary | ICD-10-CM

## 2017-12-21 DIAGNOSIS — I10 Essential (primary) hypertension: Secondary | ICD-10-CM | POA: Diagnosis not present

## 2017-12-21 DIAGNOSIS — I252 Old myocardial infarction: Secondary | ICD-10-CM | POA: Insufficient documentation

## 2017-12-21 DIAGNOSIS — Z87891 Personal history of nicotine dependence: Secondary | ICD-10-CM | POA: Diagnosis not present

## 2017-12-21 DIAGNOSIS — I493 Ventricular premature depolarization: Secondary | ICD-10-CM | POA: Diagnosis not present

## 2017-12-21 HISTORY — PX: RIGHT/LEFT HEART CATH AND CORONARY/GRAFT ANGIOGRAPHY: CATH118267

## 2017-12-21 LAB — POCT I-STAT 3, VENOUS BLOOD GAS (G3P V)
Acid-base deficit: 2 mmol/L (ref 0.0–2.0)
Bicarbonate: 22.9 mmol/L (ref 20.0–28.0)
O2 Saturation: 54 %
PCO2 VEN: 39.7 mmHg — AB (ref 44.0–60.0)
PH VEN: 7.369 (ref 7.250–7.430)
TCO2: 24 mmol/L (ref 22–32)
pO2, Ven: 29 mmHg — CL (ref 32.0–45.0)

## 2017-12-21 LAB — POCT I-STAT 3, ART BLOOD GAS (G3+)
Acid-base deficit: 2 mmol/L (ref 0.0–2.0)
Bicarbonate: 22.6 mmol/L (ref 20.0–28.0)
O2 SAT: 84 %
PH ART: 7.372 (ref 7.350–7.450)
TCO2: 24 mmol/L (ref 22–32)
pCO2 arterial: 38.9 mmHg (ref 32.0–48.0)
pO2, Arterial: 50 mmHg — ABNORMAL LOW (ref 83.0–108.0)

## 2017-12-21 LAB — GLUCOSE, CAPILLARY: Glucose-Capillary: 223 mg/dL — ABNORMAL HIGH (ref 70–99)

## 2017-12-21 SURGERY — RIGHT/LEFT HEART CATH AND CORONARY/GRAFT ANGIOGRAPHY
Anesthesia: LOCAL

## 2017-12-21 MED ORDER — HEPARIN (PORCINE) IN NACL 1000-0.9 UT/500ML-% IV SOLN
INTRAVENOUS | Status: AC
  Filled 2017-12-21: qty 1000

## 2017-12-21 MED ORDER — SODIUM CHLORIDE 0.9% FLUSH: 3.0000 mL | INTRAVENOUS | Status: DC | PRN

## 2017-12-21 MED ORDER — SODIUM CHLORIDE 0.9 % IV SOLN
INTRAVENOUS | Status: AC
  Administered 2017-12-21: 06:00:00 via INTRAVENOUS

## 2017-12-21 MED ORDER — VERAPAMIL HCL 2.5 MG/ML IV SOLN
INTRAVENOUS | Status: DC | PRN
  Administered 2017-12-21: 10 mL via INTRA_ARTERIAL

## 2017-12-21 MED ORDER — HEPARIN SODIUM (PORCINE) 1000 UNIT/ML IJ SOLN
INTRAMUSCULAR | Status: DC | PRN
  Administered 2017-12-21: 3500 [IU] via INTRAVENOUS

## 2017-12-21 MED ORDER — ASPIRIN 81 MG PO CHEW: 81.0000 mg | CHEWABLE_TABLET | ORAL | Status: DC

## 2017-12-21 MED ORDER — MIDAZOLAM HCL 2 MG/2ML IJ SOLN
INTRAMUSCULAR | Status: DC | PRN
  Administered 2017-12-21 (×2): 1 mg via INTRAVENOUS

## 2017-12-21 MED ORDER — LIDOCAINE HCL (PF) 1 % IJ SOLN
INTRAMUSCULAR | Status: DC | PRN
  Administered 2017-12-21 (×2): 2 mL via SUBCUTANEOUS

## 2017-12-21 MED ORDER — LIDOCAINE HCL (PF) 1 % IJ SOLN
INTRAMUSCULAR | Status: AC
  Filled 2017-12-21: qty 30

## 2017-12-21 MED ORDER — ACETAMINOPHEN 325 MG PO TABS: 650.0000 mg | ORAL_TABLET | ORAL | Status: DC | PRN

## 2017-12-21 MED ORDER — IOHEXOL 350 MG/ML SOLN
INTRAVENOUS | Status: DC | PRN
  Administered 2017-12-21: 80 mL via INTRA_ARTERIAL

## 2017-12-21 MED ORDER — FENTANYL CITRATE (PF) 100 MCG/2ML IJ SOLN
INTRAMUSCULAR | Status: AC
  Filled 2017-12-21: qty 2

## 2017-12-21 MED ORDER — SODIUM CHLORIDE 0.9 % IV SOLN: 250.0000 mL | INTRAVENOUS | Status: DC | PRN

## 2017-12-21 MED ORDER — SODIUM CHLORIDE 0.9% FLUSH: 3.0000 mL | Freq: Two times a day (BID) | INTRAVENOUS | Status: DC

## 2017-12-21 MED ORDER — VERAPAMIL HCL 2.5 MG/ML IV SOLN
INTRAVENOUS | Status: AC
  Filled 2017-12-21: qty 2

## 2017-12-21 MED ORDER — FENTANYL CITRATE (PF) 100 MCG/2ML IJ SOLN
INTRAMUSCULAR | Status: DC | PRN
  Administered 2017-12-21 (×2): 25 ug via INTRAVENOUS

## 2017-12-21 MED ORDER — ONDANSETRON HCL 4 MG/2ML IJ SOLN: 4.0000 mg | Freq: Four times a day (QID) | INTRAMUSCULAR | Status: DC | PRN

## 2017-12-21 MED ORDER — MIDAZOLAM HCL 2 MG/2ML IJ SOLN
INTRAMUSCULAR | Status: AC
  Filled 2017-12-21: qty 2

## 2017-12-21 MED ORDER — HEPARIN (PORCINE) IN NACL 1000-0.9 UT/500ML-% IV SOLN
INTRAVENOUS | Status: DC | PRN
  Administered 2017-12-21 (×2): 500 mL

## 2017-12-21 MED ORDER — SODIUM CHLORIDE 0.9 % IV SOLN: INTRAVENOUS | Status: AC

## 2017-12-21 SURGICAL SUPPLY — 16 items
CATH 5FR JL3.5 JR4 ANG PIG MP (CATHETERS) ×2 IMPLANT
CATH BALLN WEDGE 5F 110CM (CATHETERS) ×2 IMPLANT
CATH INFINITI 5 FR IM (CATHETERS) ×2 IMPLANT
CATH INFINITI 5FR AL1 (CATHETERS) ×2 IMPLANT
DEVICE RAD COMP TR BAND LRG (VASCULAR PRODUCTS) ×2 IMPLANT
GLIDESHEATH SLEND SS 6F .021 (SHEATH) ×2 IMPLANT
GUIDEWIRE .025 260CM (WIRE) ×2 IMPLANT
GUIDEWIRE INQWIRE 1.5J.035X260 (WIRE) ×1 IMPLANT
INQWIRE 1.5J .035X260CM (WIRE) ×2
KIT HEART LEFT (KITS) ×2 IMPLANT
PACK CARDIAC CATHETERIZATION (CUSTOM PROCEDURE TRAY) ×2 IMPLANT
SHEATH GLIDE SLENDER 4/5FR (SHEATH) ×2 IMPLANT
TRANSDUCER W/STOPCOCK (MISCELLANEOUS) ×2 IMPLANT
TUBING CIL FLEX 10 FLL-RA (TUBING) ×2 IMPLANT
WIRE EMERALD ST .035X150CM (WIRE) ×2 IMPLANT
WIRE HI TORQ VERSACORE-J 145CM (WIRE) ×2 IMPLANT

## 2017-12-21 NOTE — Discharge Instructions (Signed)

## 2017-12-21 NOTE — Interval H&P Note (Signed)
History and Physical Interval Note:  12/21/2017 11:54 AM  Koleen Distance Maele  has presented today for cardiac cath with the diagnosis of severe AS. The various methods of treatment have been discussed with the patient and family. After consideration of risks, benefits and other options for treatment, the patient has consented to  Procedure(s): RIGHT/LEFT HEART CATH AND CORONARY/GRAFT ANGIOGRAPHY (N/A) as a surgical intervention .  The patient's history has been reviewed, patient examined, no change in status, stable for surgery.  I have reviewed the patient's chart and labs.  Questions were answered to the patient's satisfaction.    Cath Lab Visit (complete for each Cath Lab visit)  Clinical Evaluation Leading to the Procedure:   ACS: No.  Non-ACS:    Anginal Classification: CCS II  Anti-ischemic medical therapy: Minimal Therapy (1 class of medications)  Non-Invasive Test Results: No non-invasive testing performed  Prior CABG: Previous CABG        Lauree Chandler

## 2017-12-24 ENCOUNTER — Encounter (HOSPITAL_COMMUNITY): Payer: Self-pay | Admitting: Cardiovascular Disease

## 2017-12-24 DIAGNOSIS — Z23 Encounter for immunization: Secondary | ICD-10-CM | POA: Diagnosis not present

## 2017-12-24 DIAGNOSIS — D1801 Hemangioma of skin and subcutaneous tissue: Secondary | ICD-10-CM | POA: Diagnosis not present

## 2017-12-24 DIAGNOSIS — D485 Neoplasm of uncertain behavior of skin: Secondary | ICD-10-CM | POA: Diagnosis not present

## 2017-12-24 DIAGNOSIS — L821 Other seborrheic keratosis: Secondary | ICD-10-CM | POA: Diagnosis not present

## 2017-12-24 DIAGNOSIS — C4441 Basal cell carcinoma of skin of scalp and neck: Secondary | ICD-10-CM | POA: Diagnosis not present

## 2017-12-24 DIAGNOSIS — L905 Scar conditions and fibrosis of skin: Secondary | ICD-10-CM | POA: Diagnosis not present

## 2017-12-28 ENCOUNTER — Ambulatory Visit (INDEPENDENT_AMBULATORY_CARE_PROVIDER_SITE_OTHER): Payer: Medicare Other | Admitting: Family Medicine

## 2017-12-28 ENCOUNTER — Encounter: Payer: Self-pay | Admitting: Family Medicine

## 2017-12-28 VITALS — BP 110/72 | HR 65 | Temp 97.7°F | Ht 64.0 in | Wt 153.0 lb

## 2017-12-28 DIAGNOSIS — E119 Type 2 diabetes mellitus without complications: Secondary | ICD-10-CM

## 2017-12-28 DIAGNOSIS — Z794 Long term (current) use of insulin: Secondary | ICD-10-CM

## 2017-12-28 LAB — HEMOGLOBIN A1C: Hgb A1c MFr Bld: 7.5 % — ABNORMAL HIGH (ref 4.6–6.5)

## 2017-12-28 MED ORDER — TAMSULOSIN HCL 0.4 MG PO CAPS: 0.4000 mg | ORAL_CAPSULE | Freq: Every day | ORAL | 3 refills | Status: DC

## 2017-12-28 NOTE — Progress Notes (Signed)
Pre visit review using our clinic review tool, if applicable. No additional management support is needed unless otherwise documented below in the visit note. 

## 2017-12-28 NOTE — Patient Instructions (Addendum)
Keep the diet clean and stay active.  Check your feet daily. Make sure you don't have any sores or cracks in your feet.  Give [place] 2-3 business days to get the results of your labs back.   If you do not hear anything about your referral in the next 1-2 weeks, call our office and ask for an update.   Let us know if you need anything.

## 2017-12-28 NOTE — Progress Notes (Addendum)
Subjective:   Chief Complaint  Patient presents with  . Follow-up    Austin Lloyd is a 84 y.o. male here for follow-up of diabetes.   Amin's self monitored glucose range is mid hundreds Patient denies hypoglycemic reactions. He checks his glucose levels 1 time per day. Patient does require insulin.   Medications include: Levemir 31 u nightly Exercise: little Interested in seeing PharmD He does not have an eye provider in the area.   Past Medical History:  Diagnosis Date  . Aortic stenosis   . Bladder cancer ([place])    T1 status post BCG  . 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 Coronary artery disease with history of myocardial infarction without history of CABG    LIMA to the LAD, SVG to RCA 2016 for treatment of the percent left main stenosis and 80% RCA stenosis.    . Depression   . Diabetes mellitus, type II ([place])   . Hypertension   . Ischemic cardiomyopathy    EF 40% 2019  . Lung cancer ([place])    Adenocarcinoma of the right lung moderately differentiated  . PVC's (premature ventricular contractions)      Related testing: Date of retinal exam: due Pneumovax: done Flu Shot: done  Review of Systems: Pulmonary:  No SOB Cardiovascular:  No chest pain  Objective:  BP 110/72 (BP Location: Left Arm, Patient Position: Sitting, Cuff Size: Normal)   Pulse 65   Temp 97.7 F (36.5 C) (Oral)   Ht 5\' 4"  (1.626 m)   Wt 153 lb (69.4 kg)   SpO2 95%   BMI 26.26 kg/m  General:  Well developed, well nourished, in no apparent distress Skin:  Warm, no pallor or diaphoresis Head:  Normocephalic, atraumatic Eyes:  Pupils equal and round, sclera anicteric without injection  Lungs:  CTAB, no access msc use Cardio:  RRR, loud SEM, no LE edema Musculoskeletal:  Symmetrical muscle groups noted without atrophy or deformity Neuro:  Sensation intact to pinprick on feet through midfoot, very decreased  sensation over the toes and forefoot bilaterally Psych: Age appropriate judgment and insight  Assessment:   Type 2 diabetes mellitus without complication, with long-term current use of insulin ([place]) - Plan: Amb Referral to Clinical Pharmacist, HM DIABETES FOOT EXAM, Hemoglobin A1c, Ambulatory referral to Ophthalmology   Plan:   Orders as above.  Needs to check feet daily.  Will refer to clinical pharmacology to see if they may assist the patient. I offered to change the insulin, but they would wish to see the pharmacologist first. Counseled on diet and exercise. F/u in 6 mo. The patient voiced understanding and agreement to the plan.  [place], DO 12/28/17 2:19 PM

## 2018-01-09 ENCOUNTER — Ambulatory Visit (HOSPITAL_BASED_OUTPATIENT_CLINIC_OR_DEPARTMENT_OTHER)
Admission: RE | Admit: 2018-01-09 | Discharge: 2018-01-09 | Disposition: A | Discharge status: Home / Self Care | Payer: Medicare Other | Source: Ambulatory Visit | Attending: Cardiovascular Disease | Admitting: Cardiovascular Disease

## 2018-01-09 ENCOUNTER — Telehealth: Payer: Self-pay | Admitting: Cardiology

## 2018-01-09 ENCOUNTER — Ambulatory Visit (HOSPITAL_COMMUNITY)
Admission: RE | Admit: 2018-01-09 | Discharge: 2018-01-09 | Disposition: A | Discharge status: Home / Self Care | Payer: Medicare Other | Source: Ambulatory Visit | Attending: Cardiovascular Disease | Admitting: Cardiovascular Disease

## 2018-01-09 ENCOUNTER — Ambulatory Visit (HOSPITAL_COMMUNITY): Payer: Medicare Other

## 2018-01-09 ENCOUNTER — Encounter (HOSPITAL_COMMUNITY): Payer: Self-pay

## 2018-01-09 ENCOUNTER — Ambulatory Visit: Payer: Medicare Other | Admitting: Family Medicine

## 2018-01-09 DIAGNOSIS — I350 Nonrheumatic aortic (valve) stenosis: Secondary | ICD-10-CM

## 2018-01-09 DIAGNOSIS — I2510 Atherosclerotic heart disease of native coronary artery without angina pectoris: Secondary | ICD-10-CM | POA: Insufficient documentation

## 2018-01-09 DIAGNOSIS — I6523 Occlusion and stenosis of bilateral carotid arteries: Secondary | ICD-10-CM | POA: Diagnosis not present

## 2018-01-09 DIAGNOSIS — Z951 Presence of aortocoronary bypass graft: Secondary | ICD-10-CM | POA: Insufficient documentation

## 2018-01-09 DIAGNOSIS — Z952 Presence of prosthetic heart valve: Secondary | ICD-10-CM | POA: Diagnosis not present

## 2018-01-09 DIAGNOSIS — I700 Atherosclerosis of aorta: Secondary | ICD-10-CM | POA: Insufficient documentation

## 2018-01-09 DIAGNOSIS — R918 Other nonspecific abnormal finding of lung field: Secondary | ICD-10-CM | POA: Diagnosis not present

## 2018-01-09 DIAGNOSIS — K8020 Calculus of gallbladder without cholecystitis without obstruction: Secondary | ICD-10-CM | POA: Diagnosis not present

## 2018-01-09 DIAGNOSIS — R0602 Shortness of breath: Secondary | ICD-10-CM | POA: Insufficient documentation

## 2018-01-09 DIAGNOSIS — K5730 Diverticulosis of large intestine without perforation or abscess without bleeding: Secondary | ICD-10-CM | POA: Diagnosis not present

## 2018-01-09 LAB — PULMONARY FUNCTION TEST
DL/VA % pred: 62 %
DL/VA: 2.64 ml/min/mmHg/L
DLCO unc % pred: 33 %
DLCO unc: 8.58 ml/min/mmHg
FEF 25-75 PRE: 0.66 L/s
FEF 25-75 Post: 1.02 L/sec
FEF2575-%Change-Post: 54 %
FEF2575-%PRED-PRE: 51 %
FEF2575-%Pred-Post: 80 %
FEV1-%CHANGE-POST: 16 %
FEV1-%PRED-POST: 74 %
FEV1-%Pred-Pre: 63 %
FEV1-POST: 1.52 L
FEV1-PRE: 1.3 L
FEV1FVC-%Change-Post: 8 %
FEV1FVC-%Pred-Pre: 88 %
FEV6-%Change-Post: 10 %
FEV6-%Pred-Post: 82 %
FEV6-%Pred-Pre: 74 %
FEV6-Post: 2.26 L
FEV6-Pre: 2.05 L
FEV6FVC-%Change-Post: 1 %
FEV6FVC-%PRED-POST: 109 %
FEV6FVC-%PRED-PRE: 107 %
FVC-%CHANGE-POST: 7 %
FVC-%PRED-PRE: 70 %
FVC-%Pred-Post: 75 %
FVC-POST: 2.26 L
FVC-PRE: 2.1 L
POST FEV6/FVC RATIO: 100 %
PRE FEV1/FVC RATIO: 62 %
Post FEV1/FVC ratio: 67 %
Pre FEV6/FVC Ratio: 99 %
RV % PRED: 73 %
RV: 1.82 L
TLC % PRED: 66 %
TLC: 4.04 L

## 2018-01-09 MED ORDER — IOPAMIDOL (ISOVUE-370) INJECTION 76%
100.0000 mL | Freq: Once | INTRAVENOUS | Status: AC | PRN
  Administered 2018-01-09: 100 mL via INTRAVENOUS

## 2018-01-09 MED ORDER — ALBUTEROL SULFATE (2.5 MG/3ML) 0.083% IN NEBU
2.5000 mg | INHALATION_SOLUTION | Freq: Once | RESPIRATORY_TRACT | Status: AC
  Administered 2018-01-09: 2.5 mg via RESPIRATORY_TRACT

## 2018-01-09 NOTE — Progress Notes (Signed)
Bilateral carotid duplex completed. 1%to 39% ICA stenosis. Vertebral artery flow is antegrade. [place] Bre[place] Joa,RVS 01/09/2018 4:19 PM

## 2018-01-09 NOTE — Telephone Encounter (Signed)
Austin Lloyd with [facility]iology called to report Critical CT findings.  "Aggressive appearing perihilar mass in the right upper lobe measuring 5.2 x 2.6 x 3.2 cm, highly concerning for primary bronchogenic carcinoma. Further evaluation with PET-CT is recommended in the near future for staging purposes."  Discussed with DOD, Dr. Margaretann Loveless. She would recommend to inform Dr. Cyndia Bent since patient is seeing him tomorrow. Will forward to Dr. Cyndia Bent.

## 2018-01-09 NOTE — Telephone Encounter (Signed)
New Message   Suanne Marker is calling, needing to give a stat angio chest result to a nurse

## 2018-01-10 ENCOUNTER — Encounter: Payer: Self-pay | Admitting: Surgery

## 2018-01-10 ENCOUNTER — Institutional Professional Consult (permissible substitution) (INDEPENDENT_AMBULATORY_CARE_PROVIDER_SITE_OTHER): Payer: Medicare Other | Admitting: Surgery

## 2018-01-10 ENCOUNTER — Ambulatory Visit: Payer: Medicare Other | Admitting: Physical Therapy

## 2018-01-10 ENCOUNTER — Other Ambulatory Visit: Payer: Self-pay | Admitting: *Deleted

## 2018-01-10 VITALS — BP 124/86 | HR 72 | Resp 20 | Ht 64.0 in | Wt 150.0 lb

## 2018-01-10 DIAGNOSIS — I2510 Atherosclerotic heart disease of native coronary artery without angina pectoris: Secondary | ICD-10-CM

## 2018-01-10 DIAGNOSIS — R911 Solitary pulmonary nodule: Secondary | ICD-10-CM

## 2018-01-10 DIAGNOSIS — Z85118 Personal history of other malignant neoplasm of bronchus and lung: Secondary | ICD-10-CM

## 2018-01-10 DIAGNOSIS — I350 Nonrheumatic aortic (valve) stenosis: Secondary | ICD-10-CM

## 2018-01-10 DIAGNOSIS — R918 Other nonspecific abnormal finding of lung field: Secondary | ICD-10-CM

## 2018-01-10 NOTE — Telephone Encounter (Signed)
CT results reviewed and [facility] team will address findings with pt at apt today with Dr Cyndia Bent.

## 2018-01-10 NOTE — Progress Notes (Signed)
[facility]  MULTIDISCIPLINARY HEART VALVE CLINIC  CARDIOTHORACIC SURGERY CONSULTATION REPORT  Referring Provider is [name], MD PCP is [name], [name], DO  Chief Complaint  Patient presents with  . Aortic Stenosis    Surgical eval for TAVR, review all studies     HPI:  The patient is an 84 year old gentleman with a history of bladder cancer, chronic lymphocytic leukemia, prostate cancer, lung cancer, and coronary disease status post coronary bypass graft surgery x2 in 2016, diabetes, hypertension, and aortic stenosis.  The patient lived in [place] prior to moving here about 3 months ago to live with his daughter.  He was diagnosed with CLL in 2012 and was treated with chemotherapy.  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  The pathology showed stage Ib with visceral pleural invasion but negative margins and no lymph node involvement.  Patient subsequently underwent coronary bypass graft surgery in 2016 with a LIMA to the LAD and a saphenous vein graft to the right coronary artery.  He has reportedly had ischemic cardiomyopathy with low ejection fraction in the past.  He was found to have a recurrence of lung cancer in February 2018 that was diagnosed by bronchoscopy.  He underwent treatment with stereotactic body radiotherapy to the lesion.  He then reportedly had a new right lower lobe nodule show up in 01/2017 that was initially concerning for recurrence but then resolved spontaneously.  Since he moved to [facility] he has been seen by [name] with oncology and plans were made for a follow-up chest CT scan in early 2020.  He had known aortic stenosis and so that he was told by his cardiologist in [place] that it appeared to be getting worse.  He was seen by [name] in August 2019 and underwent a follow-up echocardiogram on 11/06/2017 which  showed a trileaflet severely thickened and calcified aortic valve with severely restricted leaflet mobility.  The mean gradient was 26 mmHg and a peak gradient 48 mmHg.  Dimensionless index was 0.2 with a valve area of 0.89 cm.  Left ventricular ejection fraction was 40 to 45% with grade 2 diastolic dysfunction and left ventricular dilatation.  He was referred to [name] for consideration of transcatheter aortic valve replacement.  The patient is here today with his wife.  He currently lives with his wife at his daughter's house in [place].  They note that over the past year he has had slowly progressive exertional fatigue and shortness of breath.  He said that he does get short of breath walking around in his house and doing any kind of lifting or going up hills.  His wife notes that he takes frequent rest breaks.  He denies any dizziness or syncope.  He denies any chest discomfort.  He has some shortness of breath lying flat.  He has had mild lower extremity edema.  He has been eating well and maintaining his weight.  He does report some cough this been nonproductive.  No hemoptysis.  Past Medical History:  Diagnosis Date  . Aortic stenosis   . Bladder cancer ([name])    T1 status post BCG  . 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 Coronary artery disease with history of myocardial infarction without history of CABG    LIMA to the LAD, SVG to RCA 2016 for treatment of the percent  left main stenosis and 80% RCA stenosis.    . Depression   . Diabetes mellitus, type II ([name])   . Hypertension   . Ischemic cardiomyopathy    EF 40% 2019  . Lung cancer ([name])    Adenocarcinoma of the right lung moderately differentiated  . PVC's (premature ventricular contractions)     Past Surgical History:  Procedure Laterality Date  . CORONARY ARTERY BYPASS GRAFT    . INGUINAL HERNIA REPAIR    . LUNG REMOVAL, PARTIAL    . RIGHT/LEFT  HEART CATH AND CORONARY/GRAFT ANGIOGRAPHY N/A 12/21/2017   Procedure: RIGHT/LEFT HEART CATH AND CORONARY/GRAFT ANGIOGRAPHY;  Surgeon: [name], MD;  Location: [facility];  Service: Cardiovascular;  Laterality: N/A;  . Ulcer surgery      Family History  Problem Relation Age of Onset  . AAA (abdominal aortic aneurysm) Mother   . Cancer Father        Brain tumor    Social History   Socioeconomic History  . Marital status: Married    Spouse name: Not on file  . Number of children: 3  . Years of education: Not on file  . Highest education level: Not on file  Occupational History  . 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 Financial resource strain: Not on file  . Food insecurity:    Worry: Not on file    Inability: Not on file  . Transportation needs:    Medical: Not on file    Non-medical: Not on file  Tobacco Use  . Smoking status: Former Smoker    Packs/day: 0.20    Years: 30.00    Pack years: 6.00    Types: Cigarettes  . Smokeless tobacco: Never Used  Substance and Sexual Activity  . Alcohol use: Yes    Alcohol/week: 2.0 standard drinks    Types: 2 Cans of beer per week    Comment: 2 cans beer per week.  . Drug use: Never  . Sexual activity: Not on file  Lifestyle  . Physical activity:    Days per week: Not on file    Minutes per session: Not on file  . Stress: Not on file  Relationships  . Social connections:    Talks on phone: Not on file    Gets together: Not on file    Attends religious service: Not on file    Active member of club or organization: Not on file    Attends meetings of clubs or organizations: Not on file    Relationship status: Not on file  . Intimate partner violence:    Fear of current or ex partner: Not on file    Emotionally abused: Not on file    Physically abused: Not on file    Forced sexual activity: Not on file  Other Topics Concern  . Not on file  Social History  Narrative   Lives with wife daughter.  Three children and one grandchild.     Current Outpatient Medications  Medication Sig Dispense Refill  . aspirin EC 81 MG tablet Take 81 mg by mouth daily.    . furosemide (LASIX) 20 MG tablet Take 20 mg by mouth daily.    Marland Kitchen glucose blood (ACCU-CHEK ACTIVE STRIPS) test strip Use as directed to check blood sugar twice daily    . insulin detemir (LEVEMIR) 100 UNIT/ML injection Inject 31 Units into the skin daily.     . Insulin  Pen Needle 31G X 8 MM MISC by Does not apply route.    . iron polysaccharides (NIFEREX) 150 MG capsule Take 150 mg by mouth daily.    . Lancets MISC Use three times daily to check blood sugar.    . losartan (COZAAR) 25 MG tablet Take 25 mg by mouth daily.    Marland Kitchen lovastatin (MEVACOR) 40 MG tablet Take 40 mg by mouth at bedtime.    . Melatonin 10 MG TABS Take 5 mg by mouth at bedtime as needed (sleep).     . metoprolol succinate (TOPROL-XL) 25 MG 24 hr tablet Take 25 mg by mouth 2 (two) times daily.    . Multiple Vitamin (MULTIVITAMIN) tablet Take 1 tablet by mouth daily.    . tamsulosin (FLOMAX) 0.4 MG CAPS capsule Take 1 capsule (0.4 mg total) by mouth daily. 90 capsule 3   No current facility-administered medications for this visit.     No Known Allergies    Review of Systems:   General:  normal appetite, decreased energy, no weight gain, no weight loss, no fever  Cardiac:  no chest pain with exertion, no chest pain at rest, +SOB with  exertion, no resting SOB, no PND, + orthopnea, no palpitations, no arrhythmia, no atrial fibrillation, + LE edema, no dizzy spells, no syncope  Respiratory:  exertional shortness of breath, no home oxygen, no productive cough, + dry cough, no bronchitis, + wheezing, no hemoptysis, no asthma, no pain with inspiration or cough, no sleep apnea, no CPAP at night  GI:   no difficulty swallowing, no reflux, no frequent heartburn, no hiatal hernia, no abdominal pain, no constipation, no diarrhea, no  hematochezia, no hematemesis, no melena  GU:   no dysuria,  no frequency, no urinary tract infection, no hematuria, no enlarged prostate, no kidney stones, no kidney disease  Vascular:  + pain suggestive of claudication, no pain in feet, + leg cramps, no varicose veins, no DVT, no non-healing foot ulcer  Neuro:   no stroke, no TIA's, no seizures, no headaches, no temporary blindness one eye,  no slurred speech, no peripheral neuropathy, no chronic pain, no instability of gait, no memory/cognitive dysfunction  Musculoskeletal: no arthritis, no joint swelling, no myalgias, + difficulty walking, reduced mobility   Skin:   no rash, no itching, no skin infections, no pressure sores or ulcerations  Psych:   no anxiety, no depression, no nervousness, no unusual recent stress  Eyes:   no blurry vision, no floaters, no recent vision changes, + wears glasses   ENT:   + hearing loss, no loose or painful teeth, full dentures  Hematologic:  no easy bruising, no abnormal bleeding, no clotting disorder, no frequent epistaxis  Endocrine:  + diabetes, does check CBG's at home           Physical Exam:   BP 124/86   Pulse 72   Resp 20   Ht 5\' 4"  (1.626 m)   Wt 150 lb (68 kg)   SpO2 94% Comment: RA  BMI 25.75 kg/m   General:  Elderly but  well-appearing  HEENT:  Unremarkable, NCAT, PERLA, EOMI, oropharynx clear  Neck:   no JVD, no bruits, no adenopathy or thyromegaly  Chest:   clear to auscultation, symmetrical breath sounds, no wheezes, no rhonchi, old right thoracotomy scar.  CV:   RRR, grade III/VI crescendo/decrescendo murmur heard best at RSB,  no diastolic murmur, old sternotomy scar.  Abdomen:  soft, non-tender, no masses or organomegaly  Extremities:  warm, well-perfused, pulses diminished in feet, no LE edema, bilateral vein harvest incisions.  Rectal/GU  Deferred  Neuro:   Grossly non-focal and symmetrical throughout  Skin:   Clean and dry, no rashes, no breakdown   Diagnostic Tests:     [facility]                        [address],  [address]                            [phone]  ------------------------------------------------------------------- Transthoracic Echocardiography  Patient:    Austin Lloyd, Austin Lloyd MR #:       [id number] Study Date: 11/06/2017 Gender:     M Age:        28 Height:     162.6 cm Weight:     69.4 kg BSA:        1.79 m^2 Pt. Status: Room:   ORDERING     [name], MD  REFERRING    [name], MD  SONOGRAPHER  Goodwin, Walsh Croitoru, MD  PERFORMING   [facility], Outpatient  cc:  ------------------------------------------------------------------- LV EF: 40% -   45%  ------------------------------------------------------------------- Indications:      (I35.0).  ------------------------------------------------------------------- History:   PMH:  Acquired from the patient and from the patient&'s chart.  ------------------------------------------------------------------- Study Conclusions  - Left ventricle: The cavity size was mildly dilated. Wall   thickness was normal. Systolic function was mildly to moderately   reduced. The estimated ejection fraction was in the range of 40%   to 45%. Severe hypokinesis of the basal-midinferolateral,   inferior, and inferoseptal myocardium; consistent with infarction   in the distribution of the right coronary artery. Features are   consistent with a pseudonormal left ventricular filling pattern,   with concomitant abnormal relaxation and increased filling   pressure (grade 2 diastolic dysfunction). - Aortic valve: Right coronary cusp mobility was severely   restricted. Transvalvular velocity was increased less than   expected, due to low cardiac output. There was moderate to severe   stenosis. The aortic jet remains early peaking, suggesting   non-critical stenosis. There was mild regurgitation. - Mitral  valve: Calcified annulus. There was moderate regurgitation   directed eccentrically and posteriorly. - Left atrium: The atrium was moderately dilated. - Right ventricle: The cavity size was moderately dilated. Systolic   function was moderately reduced. - Right atrium: The atrium was moderately dilated.  ------------------------------------------------------------------- Study data:  No prior study was available for comparison.  Study status:  Routine.  Procedure:  The patient reported no pain pre or post test. Transthoracic echocardiography for left ventricular function evaluation and for assessment of valvular function. Image quality was adequate.  Study completion:  There were no complications.          Transthoracic echocardiography.  M-mode, complete 2D, spectral Doppler, and color Doppler.  Birthdate: Patient birthdate: [birth year].  Age:  Patient is 84 yr old.  Sex: Gender: male.    BMI: 26.2 kg/m^2.  Blood pressure:     115/64 Patient status:  Outpatient.  Study date:  Study date: 11/06/2017. Study time: 08:37 AM.  Location:  [facility]  -------------------------------------------------------------------  ------------------------------------------------------------------- Left ventricle:  The cavity size  was mildly dilated. Wall thickness was normal. Systolic function was mildly to moderately reduced. The estimated ejection fraction was in the range of 40% to 45%. Regional wall motion abnormalities:   Severe hypokinesis of the basal-midinferolateral, inferior, and inferoseptal myocardium; consistent with infarction in the distribution of the right coronary artery. Features are consistent with a pseudonormal left ventricular filling pattern, with concomitant abnormal relaxation and increased filling pressure (grade 2 diastolic dysfunction).  ------------------------------------------------------------------- Aortic valve:   Trileaflet; severely thickened,  moderately calcified leaflets.  Right coronary cusp mobility was severely restricted.  Doppler:  Transvalvular velocity was increased less than expected, due to low cardiac output. There was moderate to severe stenosis. There was mild regurgitation.    VTI ratio of LVOT to aortic valve: 0.2. Valve area (VTI): 0.93 cm^2. Indexed valve area (VTI): 0.52 cm^2/m^2. Peak velocity ratio of LVOT to aortic valve: 0.2. Valve area (Vmax): 0.93 cm^2. Indexed valve area (Vmax): 0.52 cm^2/m^2. Mean velocity ratio of LVOT to aortic valve: 0.19. Valve area (Vmean): 0.89 cm^2. Indexed valve area (Vmean): 0.5 cm^2/m^2.    Mean gradient (S): 26 mm Hg. Peak gradient (S): 48 mm Hg.  ------------------------------------------------------------------- Aorta:  Aortic root: The aortic root was normal in size. Ascending aorta: The ascending aorta was normal in size.  ------------------------------------------------------------------- Mitral valve:   Calcified annulus. Leaflet separation was normal. Doppler:  Transvalvular velocity was within the normal range. There was no evidence for stenosis. There was moderate regurgitation directed eccentrically and posteriorly.    Valve area by pressure half-time: 3.86 cm^2. Indexed valve area by pressure half-time: 2.16 cm^2/m^2.    Peak gradient (D): 6 mm Hg.  ------------------------------------------------------------------- Left atrium:  The atrium was moderately dilated.  ------------------------------------------------------------------- Right ventricle:  The cavity size was moderately dilated. Systolic function was moderately reduced.  ------------------------------------------------------------------- Pulmonic valve:   Poorly visualized.  The valve appears to be grossly normal.    Doppler:  Transvalvular velocity was within the normal range. There was no evidence for stenosis. There was no significant  regurgitation.  ------------------------------------------------------------------- Tricuspid valve:   Structurally normal valve.   Leaflet separation was normal.  Doppler:  Transvalvular velocity was within the normal range. There was no regurgitation.  ------------------------------------------------------------------- Pulmonary artery:    Systolic pressure could not be accurately estimated.  ------------------------------------------------------------------- Right atrium:  The atrium was moderately dilated.  ------------------------------------------------------------------- Pericardium:  There was no pericardial effusion.  ------------------------------------------------------------------- Systemic veins: Inferior vena cava: The vessel was normal in size. The respirophasic diameter changes were in the normal range (>= 50%), consistent with normal central venous pressure.  ------------------------------------------------------------------- Measurements   Left ventricle                           Value          Reference  LV ID, ED, PLAX chordal          (H)     59    mm       43 - 52  LV ID, ES, PLAX chordal          (H)     52    mm       23 - 38  LV fx shortening, PLAX chordal   (L)     12    %        >=29  LV PW thickness, ED  10    mm       ----------  IVS/LV PW ratio, ED                      1.1            <=1.3  Stroke volume, 2D                        69    ml       ----------  Stroke volume/bsa, 2D                    39    ml/m^2   ----------    Ventricular septum                       Value          Reference  IVS thickness, ED                        11    mm       ----------    LVOT                                     Value          Reference  LVOT ID, S                               24.3  mm       ----------  LVOT area                                4.64  cm^2     ----------  LVOT ID                                  24    mm        ----------  LVOT peak velocity, S                    68.43 cm/s     ----------  LVOT mean velocity, S                    45.9  cm/s     ----------  LVOT VTI, S                              15.79 cm       ----------  Stroke volume (SV), LVOT DP              73.2  ml       ----------  Stroke index (SV/bsa), LVOT DP           41    ml/m^2   ----------    Aortic valve                             Value          Reference  Aortic valve peak velocity, S  347   cm/s     ----------  Aortic valve mean velocity, S            240   cm/s     ----------  Aortic valve VTI, S                      80.2  cm       ----------  Aortic mean gradient, S                  26    mm Hg    ----------  Aortic peak gradient, S                  48    mm Hg    ----------  VTI ratio, LVOT/AV                       0.2            ----------  Aortic valve area, VTI                   0.93  cm^2     ----------  Aortic valve area/bsa, VTI               0.52  cm^2/m^2 ----------  Velocity ratio, peak, LVOT/AV            0.2            ----------  Aortic valve area, peak velocity         0.93  cm^2     ----------  Aortic valve area/bsa, peak              0.52  cm^2/m^2 ----------  velocity  Velocity ratio, mean, LVOT/AV            0.19           ----------  Aortic valve area, mean velocity         0.89  cm^2     ----------  Aortic valve area/bsa, mean              0.5   cm^2/m^2 ----------  velocity  Aortic regurg pressure half-time         510   ms       ----------    Aorta                                    Value          Reference  Aortic root ID, ED                       33    mm       ----------  Ascending aorta ID, A-P, S               37    mm       ----------    Left atrium                              Value          Reference  LA ID, A-P, ES                           46  mm       ----------  LA ID/bsa, A-P                   (H)     2.58  cm/m^2   <=2.2  LA volume, S                             67.6  ml        ----------  LA volume/bsa, S                         37.9  ml/m^2   ----------  LA volume, ES, 1-p A4C                   57.9  ml       ----------  LA volume/bsa, ES, 1-p A4C               32.4  ml/m^2   ----------  LA volume, ES, 1-p A2C                   69.7  ml       ----------  LA volume/bsa, ES, 1-p A2C               39    ml/m^2   ----------    Mitral valve                             Value          Reference  Mitral E-wave peak velocity              127   cm/s     ----------  Mitral A-wave peak velocity              72.7  cm/s     ----------  Mitral deceleration time                 185   ms       150 - 230  Mitral pressure half-time                54    ms       ----------  Mitral peak gradient, D                  6     mm Hg    ----------  Mitral E/A ratio, peak                   2              ----------  Mitral valve area, PHT, DP               3.86  cm^2     ----------  Mitral valve area/bsa, PHT, DP           2.16  cm^2/m^2 ----------    Right atrium                             Value          Reference  RA ID, S-I, ES, A4C              (H)     58.7  mm       34 - 49  RA area, ES, A4C                 (H)     20.4  cm^2     8.3 - 19.5  RA volume, ES, A/L                       60.1  ml       ----------  RA volume/bsa, ES, A/L                   33.7  ml/m^2   ----------    Systemic veins                           Value          Reference  Estimated CVP                            3     mm Hg    ----------    Right ventricle                          Value          Reference  RV ID, minor axis, ED, A4C       (H)     47.86 mm       26 - 43  Legend: (L)  and  (H)  mark values outside specified reference range.  ------------------------------------------------------------------- Prepared and Electronically Authenticated by  Sanda Klein, MD 2019-08-20T13:30:08   Physicians   Panel Physicians Referring Physician Case Authorizing Physician  [name], MD (Primary)    Procedures   RIGHT/LEFT HEART CATH AND CORONARY/GRAFT ANGIOGRAPHY  Conclusion     Dist RCA lesion is 99% stenosed.  Mid RCA lesion is 70% stenosed.  SVG graft was visualized by angiography and is normal in caliber.  Ost LM to Mid LM lesion is 80% stenosed.  LIMA graft was visualized by angiography and is normal in caliber.  Prox Cx to Dist Cx lesion is 20% stenosed.  Prox LAD lesion is 20% stenosed.  Hemodynamic findings consistent with moderate pulmonary hypertension.   1. Severe ostial left main stenosis 2. The LAD has mild non-obstructive disease 3. The Circumflex has mild non-obstructive disease 4. The LIMA to the LAD is patent.  5. The RCA has severe distal disease. The SVG to the PDA is patent.  6. Severe aortic stenosis (peak to peak gradient 17 mmHg, mean gradient 14.2 mmHg by cath). By echo the AVA and dimensionless index support severe aortic stenosis. The valve is thickened and calcified by echo and appears severely stenotic.  7. Elevated right sided pressures 8. Elevated LVEDP.  Recommendations: Continue workup for TAVR. I will ask him to double his Lasix over the next three days.     Indications   Severe aortic stenosis [I35.0 (ICD-10-CM)]  Procedural Details/Technique   Technical Details Indication: CAD s/p 2vCABG, severe AS, planning for TAVR  Procedure: The risks, benefits, complications, treatment options, and expected outcomes were discussed with the patient. The patient and/or family concurred with the proposed plan, giving informed consent. The patient was brought to the cath lab after IV hydration was given. The patient was sedated with Versed and Fentanyl. There was an IV catheter present in the right antecubital vein. This area was prepped and draped. The catheter was changed  for a 5 [place] sheath. Right heart catheterization performed with a balloon tipped catheter. The left wrist was prepped and draped in a sterile fashion. 1%  lidocaine was used for local anesthesia. Using the modified Seldinger access technique, a 5 French sheath was placed in the left radial artery. 3 mg Verapamil was given through the sheath. 3500 units IV heparin was given. Standard diagnostic catheters were used to perform selective coronary angiography. I crossed the aortic valve with the J wire and JL3.5 catheter. LV pressures measured. The sheath was removed from the left radial artery and a Terumo hemostasis band was applied at the arteriotomy site on the left wrist.     Estimated blood loss <50 mL.  During this procedure the patient was administered the following to achieve and maintain moderate conscious sedation: Versed 2 mg, Fentanyl 50 mcg, while the patient's heart rate, blood pressure, and oxygen saturation were continuously monitored. The period of conscious sedation was 30 minutes, of which I was present face-to-face 100% of this time.  Complications   Complications documented before study signed (12/21/2017 1:19 PM EDT)    RIGHT/LEFT HEART CATH AND CORONARY/GRAFT ANGIOGRAPHY   None Documented by [name], MD 12/21/2017 12:53 PM EDT  Time Range: Intraprocedure      Coronary Findings   Diagnostic  Dominance: Right  Left Main  Ost LM to Mid LM lesion 80% stenosed  Ost LM to Mid LM lesion is 80% stenosed.  Left Anterior Descending  Vessel is large.  Prox LAD lesion 20% stenosed  Prox LAD lesion is 20% stenosed.  Left Circumflex  Prox Cx to Dist Cx lesion 20% stenosed  Prox Cx to Dist Cx lesion is 20% stenosed.  First Obtuse Marginal Branch  Vessel is small in size.  Second Obtuse Marginal Branch  Vessel is moderate in size.  Third Obtuse Marginal Branch  Vessel is small in size.  Right Coronary Artery  Mid RCA lesion 70% stenosed  Mid RCA lesion is 70% stenosed.  Dist RCA lesion 99% stenosed  Dist RCA lesion is 99% stenosed.  saphenous Graft to Dist RCA  SVG graft was visualized by angiography and is  normal in caliber.  LIMA LIMA Graft to Mid LAD  LIMA graft was visualized by angiography and is normal in caliber.  Intervention   No interventions have been documented.  Right Heart   Right Heart Pressures Hemodynamic findings consistent with moderate pulmonary hypertension. Elevated LV EDP consistent with volume overload.  Coronary Diagrams   Diagnostic Diagram       Implants    No implant documentation for this case.  MERGE Images   Show images for CARDIAC CATHETERIZATION   Link to Procedure Log   Procedure Log    Hemo Data    Most Recent Value  Fick Cardiac Output 4.33 L/min  Fick Cardiac Output Index 2.49 (L/min)/BSA  Aortic Mean Gradient 14.2 mmHg  Aortic Peak Gradient 17 mmHg  Aortic Valve Area 1.86  Aortic Value Area Index 1.07 cm2/BSA  RA A Wave 10 mmHg  RA V Wave 7 mmHg  RA Mean 7 mmHg  RV Systolic Pressure 81 mmHg  RV Diastolic Pressure 5 mmHg  RV EDP 10 mmHg  PA Systolic Pressure 73 mmHg  PA Diastolic Pressure 31 mmHg  PA Mean 45 mmHg  PW A Wave 25 mmHg  PW V Wave 29 mmHg  PW Mean 25 mmHg  AO Systolic Pressure 401 mmHg  AO Diastolic Pressure 65 mmHg  AO Mean 89  mmHg  LV Systolic Pressure 627 mmHg  LV Diastolic Pressure 12 mmHg  LV EDP 18 mmHg  AOp Systolic Pressure 035 mmHg  AOp Diastolic Pressure 72 mmHg  AOp Mean Pressure 96 mmHg  LVp Systolic Pressure 009 mmHg  LVp Diastolic Pressure 9 mmHg  LVp EDP Pressure 18 mmHg  QP/QS 1  TPVR Index 18.1 HRUI  TSVR Index 35.79 HRUI  PVR SVR Ratio 0.24  TPVR/TSVR Ratio 0.51    CLINICAL DATA:  84 year old male with history of severe aortic stenosis. Preprocedural study prior to potential transcatheter aortic valve replacement (TAVR) procedure.  EXAM: CT ANGIOGRAPHY CHEST, ABDOMEN AND PELVIS  TECHNIQUE: Multidetector CT imaging through the chest, abdomen and pelvis was performed using the standard protocol during bolus administration of intravenous contrast. Multiplanar reconstructed images  and MIPs were obtained and reviewed to evaluate the vascular anatomy.  CONTRAST:  133mL ISOVUE-370 IOPAMIDOL (ISOVUE-370) INJECTION 76%  COMPARISON:  No priors.  FINDINGS: CTA CHEST FINDINGS  Cardiovascular: Heart size is enlarged with left ventricular dilatation. There is no significant pericardial fluid, thickening or pericardial calcification. There is aortic atherosclerosis, as well as atherosclerosis of the great vessels of the mediastinum and the coronary arteries, including calcified atherosclerotic plaque in the left main, left anterior descending, left circumflex and right coronary arteries. Status post median sternotomy for CABG including LIMA to the LAD. Thickening calcification of the aortic valve.  Mediastinum/Lymph Nodes: No pathologically enlarged mediastinal or hilar lymph nodes. Esophagus is unremarkable in appearance. No axillary lymphadenopathy.  Lungs/Pleura: In the central aspect of the right upper lobe there is a perihilar mass measuring 5.2 x 2.6 x 3.2 cm (axial image 40 of series 14 and coronal image 84 of series 16) which is macrolobulated with spiculated margins and retraction of adjacent structures, including spiculations extending to the overlying pleura which is thickened. Internal areas of potential calcification, and likely multiple internal collateral vessels. This abuts the hilum where there are nonenlarged lymph nodes. Small right pleural effusion lying dependently, likely malignant. A few other scattered tiny pulmonary nodules are noted in the lungs, nonspecific. No acute consolidative airspace disease. Trace left pleural effusion lying dependently.  Musculoskeletal/Soft Tissues: Median sternotomy. There are no aggressive appearing lytic or blastic lesions noted in the visualized portions of the skeleton.  CTA ABDOMEN AND PELVIS FINDINGS  Hepatobiliary: No suspicious cystic or solid hepatic lesions. No intra or extrahepatic  biliary ductal dilatation. 2 cm calcified gallstone in the gallbladder. No findings to suggest an acute cholecystitis at this time.  Pancreas: No pancreatic mass. No pancreatic ductal dilatation. No pancreatic or peripancreatic fluid or inflammatory changes.  Spleen: Unremarkable.  Adrenals/Urinary Tract: 2.6 cm low-attenuation lesion in the lower pole the left kidney is compatible with a simple cyst. No suspicious renal lesions. Multifocal cortical thinning in the kidneys bilaterally, most compatible with mild scarring. Bilateral adrenal glands are normal in appearance. No hydroureteronephrosis. Urinary bladder is normal in appearance.  Stomach/Bowel: Postoperative changes of gastrojejunostomy. No pathologic dilatation of small bowel or colon. Numerous colonic diverticulae are noted, without surrounding inflammatory changes to suggest an acute diverticulitis at this time. Normal appendix.  Vascular/Lymphatic: Aortic atherosclerosis, without evidence of aneurysm or dissection in the abdominal or pelvic vasculature. Vascular findings and measurements pertinent to potential TAVR procedure, as detailed below. Retroaortic left renal vein (normal anatomical variant) incidentally noted. No lymphadenopathy noted in the abdomen or pelvis.  Reproductive: Prostate gland and seminal vesicles are unremarkable in appearance.  Other: No significant volume of ascites.  No pneumoperitoneum.  Musculoskeletal: There are no aggressive appearing lytic or blastic lesions noted in the visualized portions of the skeleton.  VASCULAR MEASUREMENTS PERTINENT TO TAVR:  AORTA:  Minimal Aortic Diameter-15 x 14 mm  Severity of Aortic Calcification-severe  RIGHT PELVIS:  Right Common Iliac Artery -  Minimal Diameter-7.4 x 6.1 mm  Tortuosity-mild  Calcification-moderate to severe  Right External Iliac Artery -  Minimal Diameter-5.1 x 4.3  mm  Tortuosity-moderate  Calcification-mild-to-moderate  Right Common Femoral Artery -  Minimal Diameter-4.8 x 4.9 mm  Tortuosity-mild  Calcification-moderate  LEFT PELVIS:  Left Common Iliac Artery -  Minimal Diameter-8.0 x 5.0 mm  Tortuosity-mild  Calcification-moderate to severe  Left External Iliac Artery -  Minimal Diameter-6.4 x 4.8 mm  Tortuosity-mild  Calcification-mild-to-moderate  Left Common Femoral Artery -  Minimal Diameter-7.1 x 2.9 mm  Tortuosity-mild  Calcification-moderate  Review of the MIP images confirms the above findings.  IMPRESSION: 1. Vascular findings and measurements pertinent to potential TAVR procedure, as detailed above. 2. Thickening calcification of the aortic valve, compatible with the reported clinical history of aortic stenosis. 3. Aggressive appearing perihilar mass in the right upper lobe measuring 5.2 x 2.6 x 3.2 cm, highly concerning for primary bronchogenic carcinoma. Further evaluation with PET-CT is recommended in the near future for staging purposes. 4. Cholelithiasis without evidence of acute cholecystitis at this time. 5. Colonic diverticulosis without evidence of acute diverticulitis at this time. 6. Aortic atherosclerosis, in addition to left main and 3 vessel coronary artery disease. Status post median sternotomy for CABG including LIMA to the LAD. 7. Additional incidental findings, as above.  These results will be called to the ordering clinician or representative by the Radiologist Assistant, and communication documented in the PACS or zVision Dashboard.   Electronically Signed   By: Vinnie Langton M.D.   On: 01/09/2018 16:47   ADDENDUM REPORT: 01/10/2018 08:37  CLINICAL DATA:  84 year old male with known CAD, s/p CABG and severe aortic stenosis being evaluated for a TAVR procedure.  EXAM: Cardiac TAVR CT  TECHNIQUE: The patient was scanned on a [employer] . A 120 kV retrospective scan was triggered in the descending thoracic aorta at 111 HU's. Gantry rotation speed was 250 msecs and collimation was .6 mm. No beta blockade or nitro were given. The 3D data set was reconstructed in 5% intervals of the R-R cycle. Systolic and diastolic phases were analyzed on a dedicated work station using MPR, MIP and VRT modes. The patient received 80 cc of contrast.  FINDINGS: Aortic Valve: Trileaflet aortic valve with severely thickened but only mildly calcified leaflets with severely restricted leaflet opening. There are no calcifications extending into the LVOT.  Aorta: Normal size with mild diffuse atherosclerotic plaque and calcifications, no dissection.  Sinotubular Junction: 29 x 29 mm  Ascending Thoracic Aorta: 34 x 34 mm  Aortic Arch: 28 x 25 mm  Descending Thoracic Aorta: 26 x 25 mm  Sinus of Valsalva Measurements:  Non-coronary: 36 mm  Right -coronary: 35 mm  Left -coronary: 36 mm  Coronary Artery Height above Annulus:  Left Main: 14.7 mm  Right Coronary: 20.6 mm  Virtual Basal Annulus Measurements:  Maximum/Minimum Diameter: 29.0 x 20.0 mm  Mean Diameter: 24.7 mm  Perimeter: 90.0 mm  Area:  480 mm2  Optimum Fluoroscopic Angle for Delivery: LAO 10 CAU 12  IMPRESSION: 1. Trileaflet aortic valve with severely thickened but only mildly calcified leaflets with severely restricted leaflet opening. There are no calcifications extending into the LVOT. Annular measurements  suitable for delivery of a 26 mm Edwards-SAPIEN 3 valve.  2. Sufficient coronary to annulus distance.  3. Optimum Fluoroscopic Angle for Delivery:  LAO 10 CAU 12.  4. No thrombus in the left atrial appendage.  5. Dilated pulmonary artery measuring 39 mm.   Electronically Signed   By: Ena Dawley   On: 01/10/2018 08:37   Addended by Dorothy Spark, MD on 01/10/2018 8:39 AM    Study Result    EXAM: OVER-READ INTERPRETATION  CT CHEST  The following report is an over-read performed by radiologist Dr. Vinnie Langton of [facility], [place] on 01/09/2018. This over-read does not include interpretation of cardiac or coronary anatomy or pathology. The coronary CTA interpretation by the cardiologist is attached.  COMPARISON:  None.  FINDINGS: Extracardiac findings will be described separately under dictation for contemporaneously obtained CTA chest, abdomen and pelvis.  IMPRESSION: Please see separate dictation for contemporaneously obtained CTA of the chest, abdomen and pelvis dated 01/09/2018 for full description of relevant extracardiac findings.  Electronically Signed: By: Vinnie Langton M.D. On: 01/09/2018 15:39       Impression:  This 84 year old gentleman has stage D, severe, symptomatic low gradient aortic stenosis with New York Heart Association class III symptoms of exertional fatigue and shortness of breath consistent with chronic diastolic congestive heart failure.  I have personally reviewed his 2D echocardiogram, cardiac catheterization, and CTA studies.  His echocardiogram shows a trileaflet aortic valve with severe thickening and moderate calcification of the aortic valve leaflets with severely restricted mobility and looks like a severely stenotic valve despite only having a mean gradient of 26 mmHg.  Cardiac catheterization shows patent bypass grafts and otherwise nonobstructive disease.  His mean gradient was only 14.2 mmHg although again by echocardiogram his valve looks severely stenotic.  Left ventricular end-diastolic pressure was elevated at 18 mmHg and he had moderate to severe pulmonary hypertension.  Ideally transcatheter aortic valve replacement would be the best treatment for his aortic stenosis.  Unfortunately his CTA of the chest done yesterday as part of his work-up showed a 5.2 x 2.6 x 3.2 cm right perihilar mass that is most likely  a recurrent lung cancer given his prior history of right upper lobectomy for lung cancer and subsequent recurrence a year ago treated with radiation therapy.  He also has a small right pleural effusion which could be malignant.  There is no significant lymphadenopathy within the mediastinum and no signs of solid organ or bone involvement.  I think this right lung mass will require further work-up before considering transcatheter aortic valve replacement.  I will get a PET scan and have him follow-up with his medical oncologist to decide on the next course of action.  If this is indeed recurrent lung cancer I do not think he would be a candidate for transcatheter aortic valve replacement at 84 years old.  I reviewed the CT scan images with the patient and his wife and answered all their questions.   Plan:  The patient will have a PET scan done and then will follow-up with his medical oncologist, [name].  I spent 60 minutes performing this consultation and > 50% of this time was spent face to face counseling and coordinating the care of this patient's severe symptomatic aortic stenosis.   Gaye Pollack, MD 01/10/2018 1:51 PM

## 2018-01-10 NOTE — Telephone Encounter (Signed)
Can you ladies please ensure that the doc reviews this over with the patient today at their office visit. Thanks :)

## 2018-01-11 ENCOUNTER — Ambulatory Visit: Payer: Medicare Other | Admitting: Podiatry

## 2018-01-14 ENCOUNTER — Other Ambulatory Visit: Payer: Self-pay

## 2018-01-14 DIAGNOSIS — C4490 Unspecified malignant neoplasm of skin, unspecified: Secondary | ICD-10-CM

## 2018-01-18 ENCOUNTER — Telehealth: Payer: Self-pay

## 2018-01-18 ENCOUNTER — Encounter (HOSPITAL_COMMUNITY)
Admission: RE | Admit: 2018-01-18 | Discharge: 2018-01-18 | Disposition: A | Discharge status: Home / Self Care | Payer: Medicare Other | Source: Ambulatory Visit | Attending: Surgery | Admitting: Surgery

## 2018-01-18 ENCOUNTER — Telehealth: Payer: Self-pay | Admitting: Family Medicine

## 2018-01-18 DIAGNOSIS — C4490 Unspecified malignant neoplasm of skin, unspecified: Secondary | ICD-10-CM

## 2018-01-18 DIAGNOSIS — C3491 Malignant neoplasm of unspecified part of right bronchus or lung: Secondary | ICD-10-CM | POA: Diagnosis not present

## 2018-01-18 DIAGNOSIS — Z85118 Personal history of other malignant neoplasm of bronchus and lung: Secondary | ICD-10-CM | POA: Diagnosis not present

## 2018-01-18 DIAGNOSIS — R918 Other nonspecific abnormal finding of lung field: Secondary | ICD-10-CM | POA: Insufficient documentation

## 2018-01-18 DIAGNOSIS — C61 Malignant neoplasm of prostate: Secondary | ICD-10-CM | POA: Diagnosis not present

## 2018-01-18 LAB — GLUCOSE, CAPILLARY: GLUCOSE-CAPILLARY: 67 mg/dL — AB (ref 70–99)

## 2018-01-18 MED ORDER — FLUDEOXYGLUCOSE F - 18 (FDG) INJECTION
6.8000 | Freq: Once | INTRAVENOUS | Status: AC | PRN
  Administered 2018-01-18: 6.8 via INTRAVENOUS

## 2018-01-18 NOTE — Telephone Encounter (Signed)
Ok thank you for the information

## 2018-01-18 NOTE — Telephone Encounter (Signed)
Wife calling, anxious about husband's care, and requesting recent labwork to be mailed to home address. Pt. Was not aware that the office does not routinely mail out results unless requested, particularly if they are done from another provider. Wife requesting an internal dermatology referral (for reportedly cancerous mole removal) and an internal endocrinology referral as well to manage pt's DM II.  Author reassured pt. That these requests would be relayed to PCP.

## 2018-01-18 NOTE — Telephone Encounter (Signed)
Referral done

## 2018-01-18 NOTE — Telephone Encounter (Signed)
Mr. Austin Lloyd called the office and stated that he was hungry, diabetic and wanted to eat something before his PET scan today at 12:30.  Patient stated when asked that his blood sugar this morning was 165.  I advised him that a BG of 165 was not low enough to warrant eating and he should wait for his scan to eat.  I did advised that if he became faint or lightheaded to check his blood sugar.  If it was 60 or less he should eat something.  If this is the case I did tell him that he needed to call and cancel his appointment and reschedule it for early in the morning.  He acknowledged receipt.  Instructions were given when appointment was scheduled that patient could eat 6 hours before procedure but to avoid carbohydrates.

## 2018-01-18 NOTE — Telephone Encounter (Signed)
Can we make sure Josph Macho was sent to Dr. Ronnald Ramp also please? TY.

## 2018-01-22 ENCOUNTER — Telehealth: Payer: Self-pay

## 2018-01-22 NOTE — Telephone Encounter (Signed)
Received phone call from [place] at [facility] regarding recent pt scans in preparation for potential heart valve placement. Mass noted in CT Angio of Chest on 10/23, so PET performed on 11/1. Request for oncologist to review. [name] out of the office for two weeks. Discussed with Dr. Julien Nordmann who will review. In-basket sent to Dr. Julien Nordmann and Inocencio Homes, RN with details of request and contact numbers.  Phone: [phone] Fax: [phone]

## 2018-01-22 NOTE — Telephone Encounter (Signed)
Dr Cyndia Bent reviewed PET scan results and requested that Dr Irene Limbo review scans.  I contacted Oncology and spoke with Aldona Bar and made her aware that pt was currently under going TAVR evaluation.   Per 01/09/18 CTA: Aggressive appearing perihilar mass in the right upper lobe measuring 5.2 x 2.6 x 3.2 cm, highly concerning for primary bronchogenic carcinoma. Further evaluation with PET-CT is recommended in the near future for staging purposes.  Dr Cyndia Bent saw the pt in the office on 01/10/2018 and PET scan was ordered.    Per Aldona Bar, Dr Irene Limbo will be out of the office until 11/18.  She will forward results to on-call staff and lung specialist to review and make further recommendations.  I will await return call from Oncology.

## 2018-01-28 ENCOUNTER — Other Ambulatory Visit: Payer: Self-pay

## 2018-01-28 DIAGNOSIS — L602 Onychogryphosis: Secondary | ICD-10-CM | POA: Diagnosis not present

## 2018-01-28 DIAGNOSIS — I350 Nonrheumatic aortic (valve) stenosis: Secondary | ICD-10-CM

## 2018-01-28 DIAGNOSIS — L84 Corns and callosities: Secondary | ICD-10-CM | POA: Diagnosis not present

## 2018-01-28 DIAGNOSIS — E1159 Type 2 diabetes mellitus with other circulatory complications: Secondary | ICD-10-CM | POA: Diagnosis not present

## 2018-01-31 NOTE — Pre-Procedure Instructions (Signed)
Austin Lloyd [facility]  01/31/2018      [id number] - [place], [place] - [address] [place] [address] Phone: [phone] Fax: [phone]    Your procedure is scheduled on February 05, 2018.  Report to [facility] Admitting at 530 AM.  Call this number if you have problems the morning of surgery:  [phone]   Remember:  Do not eat or drink after midnight.    Take these medicines the morning of surgery with A SIP OF WATER -none  Continue aspirin 81 mg up until the day of surgery as advised by your surgeon  7 days prior to surgery STOP taking any NSAIDS: Aleve, Naproxen, Ibuprofen, Motrin, Advil, Goody's, BC's, all herbal medications, fish oil, and all vitamins  WHAT DO I DO ABOUT MY DIABETES MEDICATION?  . THE MORNING OF SURGERY, take 15 units of levemir insulin-1/2 of your normal dose.  Reviewed and Endorsed by [facility] Patient Education Committee, August 2015  How to Manage Your Diabetes Before and After Surgery  Why is it important to control my blood sugar before and after surgery? . Improving blood sugar levels before and after surgery helps healing and can limit problems. . A way of improving blood sugar control is eating a healthy diet by: o  Eating less sugar and carbohydrates o  Increasing activity/exercise o  Talking with your doctor about reaching your blood sugar goals . High blood sugars (greater than 180 mg/dL) can raise your risk of infections and slow your recovery, so you will need to focus on controlling your diabetes during the weeks before surgery. . Make sure that the doctor who takes care of your diabetes knows about your planned surgery including the date and location.  How do I manage my blood sugar before surgery? . Check your blood sugar at least 4 times a day, starting 2 days before surgery, to make sure that the level is not too high or low. o Check  your blood sugar the morning of your surgery when you wake up and every 2 hours until you get to the Short Stay unit. . If your blood sugar is less than 70 mg/dL, you will need to treat for low blood sugar: o Do not take insulin. o Treat a low blood sugar (less than 70 mg/dL) with  cup of clear juice (cranberry or apple), 4 glucose tablets, OR glucose gel. Recheck blood sugar in 15 minutes after treatment (to make sure it is greater than 70 mg/dL). If your blood sugar is not greater than 70 mg/dL on recheck, call [phone] o  for further instructions. . Report your blood sugar to the short stay nurse when you get to Short Stay.  . If you are admitted to the hospital after surgery: o Your blood sugar will be checked by the staff and you will probably be given insulin after surgery (instead of oral diabetes medicines) to make sure you have good blood sugar levels. o The goal for blood sugar control after surgery is 80-180 mg/dL.   [place]- Preparing For Surgery  Before surgery, you can play an important role. Because skin is not sterile, your skin needs to be as free of germs as possible. You can reduce the number of germs on your skin by washing with CHG (chlorahexidine gluconate) Soap before surgery.  CHG is an antiseptic cleaner which kills germs and bonds with the skin to continue killing germs  even after washing.    Oral Hygiene is also important to reduce your risk of infection.  Remember - BRUSH YOUR TEETH THE MORNING OF SURGERY WITH YOUR REGULAR TOOTHPASTE  Please do not use if you have an allergy to CHG or antibacterial soaps. If your skin becomes reddened/irritated stop using the CHG.  Do not shave (including legs and underarms) for at least 48 hours prior to first CHG shower. It is OK to shave your face.  Please follow these instructions carefully.   1. Shower the NIGHT BEFORE SURGERY and the MORNING OF SURGERY with CHG.   2. If you chose to wash your hair, wash your hair  first as usual with your normal shampoo.  3. After you shampoo, rinse your hair and body thoroughly to remove the shampoo.  4. Use CHG as you would any other liquid soap. You can apply CHG directly to the skin and wash gently with a scrungie or a clean washcloth.   5. Apply the CHG Soap to your body ONLY FROM THE NECK DOWN.  Do not use on open wounds or open sores. Avoid contact with your eyes, ears, mouth and genitals (private parts). Wash Face and genitals (private parts)  with your normal soap.  6. Wash thoroughly, paying special attention to the area where your surgery will be performed.  7. Thoroughly rinse your body with warm water from the neck down.  8. DO NOT shower/wash with your normal soap after using and rinsing off the CHG Soap.  9. Pat yourself dry with a CLEAN TOWEL.  10. Wear CLEAN PAJAMAS to bed the night before surgery, wear comfortable clothes the morning of surgery  11. Place CLEAN SHEETS on your bed the night of your first shower and DO NOT SLEEP WITH PETS.  Day of Surgery:  Do not apply any deodorants/lotions.  Please wear clean clothes to the hospital/surgery center.   Remember to brush your teeth WITH YOUR REGULAR TOOTHPASTE.    Do not wear jewelry  Do not wear lotions, powders, or colognes, or deodorant.  Men may shave face and neck.  Do not bring valuables to the hospital.  [facility] is not responsible for any belongings or valuables.  Contacts, dentures or bridgework may not be worn into surgery.  Leave your suitcase in the car.  After surgery it may be brought to your room.  For patients admitted to the hospital, discharge time will be determined by your treatment team.  Patients discharged the day of surgery will not be allowed to drive home.   Please read over the following fact sheets that you were given. Pain Booklet, Coughing and Deep Breathing, MRSA Information and Surgical Site Infection Prevention

## 2018-02-01 ENCOUNTER — Ambulatory Visit: Payer: Medicare Other | Admitting: Cardiology

## 2018-02-01 ENCOUNTER — Other Ambulatory Visit: Payer: Self-pay

## 2018-02-01 ENCOUNTER — Encounter (HOSPITAL_COMMUNITY)
Admission: RE | Admit: 2018-02-01 | Discharge: 2018-02-01 | Disposition: A | Discharge status: Home / Self Care | Payer: Medicare Other | Source: Ambulatory Visit | Attending: Cardiovascular Disease | Admitting: Cardiovascular Disease

## 2018-02-01 ENCOUNTER — Encounter (HOSPITAL_COMMUNITY): Payer: Self-pay

## 2018-02-01 ENCOUNTER — Telehealth: Payer: Self-pay | Admitting: Physician Assistant

## 2018-02-01 ENCOUNTER — Encounter: Payer: Self-pay | Admitting: Physical Therapy

## 2018-02-01 ENCOUNTER — Ambulatory Visit: Payer: Medicare Other | Attending: Cardiovascular Disease | Admitting: Physical Therapy

## 2018-02-01 DIAGNOSIS — R2689 Other abnormalities of gait and mobility: Secondary | ICD-10-CM | POA: Diagnosis not present

## 2018-02-01 DIAGNOSIS — I517 Cardiomegaly: Secondary | ICD-10-CM | POA: Insufficient documentation

## 2018-02-01 DIAGNOSIS — I350 Nonrheumatic aortic (valve) stenosis: Secondary | ICD-10-CM | POA: Diagnosis not present

## 2018-02-01 DIAGNOSIS — Z951 Presence of aortocoronary bypass graft: Secondary | ICD-10-CM | POA: Diagnosis not present

## 2018-02-01 DIAGNOSIS — R9431 Abnormal electrocardiogram [ECG] [EKG]: Secondary | ICD-10-CM | POA: Diagnosis not present

## 2018-02-01 DIAGNOSIS — J984 Other disorders of lung: Secondary | ICD-10-CM | POA: Diagnosis not present

## 2018-02-01 DIAGNOSIS — Z01818 Encounter for other preprocedural examination: Secondary | ICD-10-CM | POA: Diagnosis not present

## 2018-02-01 LAB — CBC
HCT: 41 % (ref 39.0–52.0)
HEMOGLOBIN: 13.3 g/dL (ref 13.0–17.0)
MCH: 29.4 pg (ref 26.0–34.0)
MCHC: 32.4 g/dL (ref 30.0–36.0)
MCV: 90.7 fL (ref 80.0–100.0)
NRBC: 0 % (ref 0.0–0.2)
Platelets: 180 10*3/uL (ref 150–400)
RBC: 4.52 MIL/uL (ref 4.22–5.81)
RDW: 14.5 % (ref 11.5–15.5)
WBC: 7.4 10*3/uL (ref 4.0–10.5)

## 2018-02-01 LAB — BLOOD GAS, ARTERIAL
Acid-Base Excess: 0.3 mmol/L (ref 0.0–2.0)
BICARBONATE: 24 mmol/L (ref 20.0–28.0)
Drawn by: 421801
FIO2: 21
O2 Saturation: 94.9 %
PATIENT TEMPERATURE: 98.6
PCO2 ART: 35.8 mmHg (ref 32.0–48.0)
PO2 ART: 87.4 mmHg (ref 83.0–108.0)
pH, Arterial: 7.442 (ref 7.350–7.450)

## 2018-02-01 LAB — GLUCOSE, CAPILLARY: Glucose-Capillary: 214 mg/dL — ABNORMAL HIGH (ref 70–99)

## 2018-02-01 LAB — PROTIME-INR
INR: 0.95
PROTHROMBIN TIME: 12.6 s (ref 11.4–15.2)

## 2018-02-01 LAB — COMPREHENSIVE METABOLIC PANEL
ALBUMIN: 3.5 g/dL (ref 3.5–5.0)
ALK PHOS: 80 U/L (ref 38–126)
ALT: 23 U/L (ref 0–44)
ANION GAP: 9 (ref 5–15)
AST: 27 U/L (ref 15–41)
BILIRUBIN TOTAL: 0.8 mg/dL (ref 0.3–1.2)
BUN: 39 mg/dL — AB (ref 8–23)
CALCIUM: 9.1 mg/dL (ref 8.9–10.3)
CO2: 21 mmol/L — AB (ref 22–32)
CREATININE: 1.52 mg/dL — AB (ref 0.61–1.24)
Chloride: 108 mmol/L (ref 98–111)
GFR calc Af Amer: 46 mL/min — ABNORMAL LOW (ref >60)
GFR calc non Af Amer: 40 mL/min — ABNORMAL LOW (ref >60)
GLUCOSE: 151 mg/dL — AB (ref 70–99)
Potassium: 4.8 mmol/L (ref 3.5–5.1)
SODIUM: 138 mmol/L (ref 135–145)
TOTAL PROTEIN: 6.2 g/dL — AB (ref 6.5–8.1)

## 2018-02-01 LAB — HEMOGLOBIN A1C
Hgb A1c MFr Bld: 7.1 % — ABNORMAL HIGH (ref 4.8–5.6)
Mean Plasma Glucose: 157.07 mg/dL

## 2018-02-01 LAB — TYPE AND SCREEN
ABO/RH(D): O NEG
ANTIBODY SCREEN: NEGATIVE

## 2018-02-01 LAB — URINALYSIS, ROUTINE W REFLEX MICROSCOPIC
BACTERIA UA: NONE SEEN
BILIRUBIN URINE: NEGATIVE
GLUCOSE, UA: 150 mg/dL — AB
Ketones, ur: NEGATIVE mg/dL
Leukocytes, UA: NEGATIVE
NITRITE: NEGATIVE
PH: 5 (ref 5.0–8.0)
Protein, ur: 30 mg/dL — AB
SPECIFIC GRAVITY, URINE: 1.012 (ref 1.005–1.030)

## 2018-02-01 LAB — BRAIN NATRIURETIC PEPTIDE: B Natriuretic Peptide: 447.9 pg/mL — ABNORMAL HIGH (ref 0.0–100.0)

## 2018-02-01 LAB — ABO/RH: ABO/RH(D): O NEG

## 2018-02-01 LAB — APTT: aPTT: 27 seconds (ref 24–36)

## 2018-02-01 LAB — SURGICAL PCR SCREEN
MRSA, PCR: NEGATIVE
STAPHYLOCOCCUS AUREUS: POSITIVE — AB

## 2018-02-01 NOTE — Therapy (Signed)
[place], [place], [address] Phone: [phone]   Fax:  [phone]  Physical Therapy Evaluation  Patient Details  Name: Austin Lloyd MRN: [id number] Date of Birth: 1929-05-12 Referring Provider (PT): Dr. Lauree Chandler   Encounter Date: 02/01/2018  PT End of Session - 02/01/18 0927    Visit Number  1    PT Start Time  8676    PT Stop Time  1005    PT Time Calculation (min)  40 min       Past Medical History:  Diagnosis Date  . Aortic stenosis   . Bladder cancer ([employer])    T1 status post BCG  . 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 Coronary artery disease with history of myocardial infarction without history of CABG    LIMA to the LAD, SVG to RCA 2016 for treatment of the percent left main stenosis and 80% RCA stenosis.    . Depression   . Diabetes mellitus, type II ([place])   . Dyspnea   . Hypertension   . Ischemic cardiomyopathy    EF 40% 2019  . Lung cancer ([place])    Adenocarcinoma of the right lung moderately differentiated  . PVC's (premature ventricular contractions)     Past Surgical History:  Procedure Laterality Date  . CORONARY ARTERY BYPASS GRAFT     2014  . INGUINAL HERNIA REPAIR    . LUNG REMOVAL, PARTIAL    . RIGHT/LEFT HEART CATH AND CORONARY/GRAFT ANGIOGRAPHY N/A 12/21/2017   Procedure: RIGHT/LEFT HEART CATH AND CORONARY/GRAFT ANGIOGRAPHY;  Surgeon: [name], MD;  Location: [facility];  Service: Cardiovascular;  Laterality: N/A;  . Ulcer surgery      There were no vitals filed for this visit.   Subjective Assessment - 02/01/18 0932    Subjective  Pt reports progressive exertional fatigue and shortness of breath over the past year - limits walking in the house, lifting and going up hills.     Patient Stated Goals  to fix heart and feel better    Currently in Pain?   No/denies         [facility] PT Assessment - 02/01/18 0001      Assessment   Medical Diagnosis  severe aortic stenosis    Referring Provider (PT)  Dr. Lauree Chandler      Precautions   Precautions  None      Restrictions   Weight Bearing Restrictions  No      Balance Screen   Has the patient fallen in the past 6 months  No    Has the patient had a decrease in activity level because of a fear of falling?   No    Is the patient reluctant to leave their home because of a fear of falling?   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    Additional Comments  getting ready to move       Prior Function   Level of Independence  Independent with community mobility without device      Posture/Postural Control   Posture/Postural Control  Postural limitations    Postural Limitations  Rounded Shoulders;Forward head   mild     ROM / Strength   AROM /  PROM / Strength  AROM;Strength      AROM   Overall AROM Comments  grossly WNL throughout      Strength   Overall Strength Comments  grossly WNL throughout    Strength Assessment Site  Hand    Right/Left hand  Right;Left    Right Hand Grip (lbs)  47   R hand dominant   Left Hand Grip (lbs)  37      Ambulation/Gait   Gait Comments  Pt ambulates with slow speed and decreased foot clearance. Gait distance limited by 67% for his age/gender.        [facility] Pre-Surgical Assessment - 02/01/18 0001    5 Meter Walk Test- trial 1  8 sec    5 Meter Walk Test- trial 2  8 sec.     5 Meter Walk Test- trial 3  8 sec.    5 meter walk test average  8 sec    4 Stage Balance Test tolerated for:   2 sec.    4 Stage Balance Test Position  4    Sit To Stand Test- trial 1  14 sec.    ADL/IADL Independent with:  Bathing;Dressing;Meal prep;Finances    ADL/IADL Needs Assistance with:  Yard work    6 Minute Walk-  Baseline  yes    BP (mmHg)  138/78    HR (bpm)  73    02 Sat (%RA)  99 %    Modified Borg Scale for Dyspnea  1- Very mild shortness of breath    Perceived Rate of Exertion (Borg)  6-    6 Minute Walk Post Test  yes    BP (mmHg)  184/84    HR (bpm)  94    02 Sat (%RA)  89 %   recovered to 94% within 1 minute   Modified Borg Scale for Dyspnea  5- Strong or hard breathing    Perceived Rate of Exertion (Borg)  13- Somewhat hard    Aerobic Endurance Distance Walked  635              Objective measurements completed on examination: See above findings.                           Plan - 02/01/18 1113    Clinical Impression Statement  see below    PT Frequency  One time visit      Clinical Impression Statement: Pt is a 84 yo male presenting to OP PT for evaluation prior to possible TAVR surgery due to severe aortic stenosis. Pt reports onset of slowly progressive exertional fatigue and shortness of breath approximately 12 months ago. Symptoms are limiting walking in house, lifting, going up hills. Pt presents with good ROM and strength, good balance and is not at high fall risk 4 stage balance test, slow walking speed and poor aerobic endurance per 6 minute walk test. Pt ambulated a total of 635 feet in 6 minute walk. O2 decreased to 89% at the end of test but recovered to 94% in about 1 minute. Based on the Short Physical Performance Battery, patient has a frailty rating of 9/12 with </= 5/12 considered frail.   Patient demonstrated the following deficits and impairments:     Visit Diagnosis: Other abnormalities of gait and mobility     Problem List Patient Active Problem List   Diagnosis Date Noted  . Coronary artery disease involving native coronary artery of native heart  without angina pectoris   . Severe aortic stenosis   . Aortic valve stenosis 11/01/2017  . Medication management 11/01/2017  . Other fatigue 11/01/2017  . Cardiomyopathy ([place])  11/01/2017  . Hyperlipidemia 11/01/2017  . Type 2 diabetes mellitus without complication ([place]) 59/29/2446  . Essential hypertension 10/08/2017    [place], PT 02/01/2018, 11:15 AM  [facility] [address] [place], [place], [address] Phone: [phone]   Fax:  [phone]  Name: Austin Lloyd MRN: [id number] Date of Birth: [birth year]

## 2018-02-01 NOTE — Progress Notes (Addendum)
Anesthesia PAT Evaluation:  Case:  [id number] Date/Time:  02/05/18 0715   Procedures:      TRANSCATHETER AORTIC VALVE REPLACEMENT, TRANSFEMORAL (N/A Chest)     TRANSESOPHAGEAL ECHOCARDIOGRAM (TEE) (N/A )   Anesthesia type:  General   Pre-op diagnosis:  Severe Aortic Stenosis   Location:  [facility] ROOM 16 / [facility]   Surgeon:  [name], MD      DISCUSSION: Patient is a 84 year old male scheduled for the above procedure.   History includes severe AS, bladder cancer (s/p BCG), prostate cancer (low grade), skin cancer (SCC, BCC), CLL (2015), lung cancer (s/p RUL wedge resection 12/26/13, s/p stereotactic body radiotherapy for RUL recurrence 05/2016), CAD (NSTEMI 08/2014; s/p 2V CABG: LIMA-LAD, SVG-RCA 2016), ischemic cardiomyopathy, PVCs, CKD. Former smoker. It appears he moved from [place] to [place] earlier this year to be near his daughter.   Patient reports he's overall doing well. Has noted more exertional dyspnea over the past several months, but no acute changes. No chest pain, syncope/presycnope. He is compliant with his medications. Today's PAT EKGs reviewed with Angelena Form, PA-C. If no acute changes then it is anticipated that he can proceed as planned from an anesthesia standpoint.   ADDENDUM 02/14/18 10:40 AM: Dr. Cyndia Bent had requested that [name] review patient's recent chest CT and PET scan to determine if any concerns for recurrent lung cancer that might alter recommendations to proceed with TAVR. By notes, [name] has been out of the office, but in follow-up with Theodosia Quay, RN, Dr. Cyndia Bent presented his case at the thoracic oncologist meeting with agreement that findings were more consistent with inflammatory process. Current plan is to proceed with TAVR as scheduled with repeat chest CT early next year (~ January).    VS: BP (!) 141/95   Pulse 75   Temp 36.4 C   Resp 20   Ht 5\' 4"  (1.626 m)   Wt 69 kg   SpO2 98%   BMI 26.13 kg/m   PROVIDERS: Shelda Pal, DO is PCP [name], MD is cardiologist Sullivan Lone, MD is HEM-ONC. Last visit 11/28/17. Repeat Chest CT planned in 3-4 months. Louis Meckel, MD is urologist   LABS: Labs reviewed: Acceptable for surgery. (all labs ordered are listed, but only abnormal results are displayed)  Labs Reviewed  SURGICAL PCR SCREEN - Abnormal; Notable for the following components:      Result Value   Staphylococcus aureus POSITIVE (*)    All other components within normal limits  GLUCOSE, CAPILLARY - Abnormal; Notable for the following components:   Glucose-Capillary 214 (*)    All other components within normal limits  COMPREHENSIVE METABOLIC PANEL - Abnormal; Notable for the following components:   CO2 21 (*)    Glucose, Bld 151 (*)    BUN 39 (*)    Creatinine, Ser 1.52 (*)    Total Protein 6.2 (*)    GFR calc non Af Amer 40 (*)    GFR calc Af Amer 46 (*)    All other components within normal limits  HEMOGLOBIN A1C - Abnormal; Notable for the following components:   Hgb A1c MFr Bld 7.1 (*)    All other components within normal limits  URINALYSIS, ROUTINE W REFLEX MICROSCOPIC - Abnormal; Notable for the following components:   Glucose, UA 150 (*)    Hgb urine dipstick SMALL (*)    Protein, ur 30 (*)    All other components within normal limits  BRAIN NATRIURETIC PEPTIDE - Abnormal; Notable for the following components:   B Natriuretic Peptide 447.9 (*)    All other components within normal limits  APTT  BLOOD GAS, ARTERIAL  CBC  PROTIME-INR  TYPE AND SCREEN  ABO/RH   Lab Results  Component Value Date   CREATININE 1.52 (H) 02/01/2018   CREATININE 1.39 (H) 12/17/2017   CREATININE 1.71 (H) 11/28/2017    PFTs 01/09/18: PRE-BD: FVC 2.10 (70%), FEV1 1.30 (63%). POST-BD: FVC 2.26 (75%), FEV1 1.52 (74%). DLCO unc 8.58 (33%).   IMAGES: CXR 02/01/18: IMPRESSION: 1. Postsurgical change and persistent density in the right upper lung. Reference is made to prior  PET-CT report 01/18/2018. 2.  Prior CABG.  Cardiomegaly.  No pulmonary venous congestion.  PET Scan 01/18/18: IMPRESSION: 1. Moderate metabolic activity along the lateral margin of the RIGHT perihilar mass is less than would be expected for bronchogenic carcinoma of this size. Favor postsurgical inflammatory activity. Cannot exclude recurrence at the lateral margin adjacent to the clip/calcifications. Recommend follow-up CT with contrast for future surveillance. No comparison available at this time 2. Bilateral hypermetabolic adrenal glands without nodular lesion is favored benign hyperplasia. 3. Small RIGHT effusion similar to prior. 4. Large RIGHT hydrocele of the RIGHT hemiscrotum  CTA chest/abd/pelvis 01/09/18: IMPRESSION: 1. Vascular findings and measurements pertinent to potential TAVR procedure, as detailed above. (See Results tab) 2. Thickening calcification of the aortic valve, compatible with the reported clinical history of aortic stenosis. 3. Aggressive appearing perihilar mass in the right upper lobe measuring 5.2 x 2.6 x 3.2 cm, highly concerning for primary bronchogenic carcinoma. Further evaluation with PET-CT is recommended in the near future for staging purposes. 4. Cholelithiasis without evidence of acute cholecystitis at this time. 5. Colonic diverticulosis without evidence of acute diverticulitis at this time. 6. Aortic atherosclerosis, in addition to left main and 3 vessel coronary artery disease. Status post median sternotomy for CABG including LIMA to the LAD. 7. Additional incidental findings, as above (see Results tab).   EKG: 02/01/18: SR, occasional PVCs, non-specific ST abnormality. Previous EKG also done 02/01/18 (in [place]) showed frequent multiple focal PVCs with a ventricular couplet. Patient denied syncope, presyncope, chest pain, new SOB.   CV: CT coronary 01/09/18: IMPRESSION: 1. Trileaflet aortic valve with severely thickened but only  mildly calcified leaflets with severely restricted leaflet opening. There are no calcifications extending into the LVOT. Annular measurements suitable for delivery of a 26 mm Edwards-SAPIEN 3 valve. 2. Sufficient coronary to annulus distance. 3. Optimum Fluoroscopic Angle for Delivery:  LAO 10 CAU 12. 4. No thrombus in the left atrial appendage. 5. Dilated pulmonary artery measuring 39 mm.  Cardiac cath 12/21/17:  Dist RCA lesion is 99% stenosed.  Mid RCA lesion is 70% stenosed.  SVG graft was visualized by angiography and is normal in caliber.  Ost LM to Mid LM lesion is 80% stenosed.  LIMA graft was visualized by angiography and is normal in caliber.  Prox Cx to Dist Cx lesion is 20% stenosed.  Prox LAD lesion is 20% stenosed.  Hemodynamic findings consistent with moderate pulmonary hypertension. 1. Severe ostial left main stenosis 2. The LAD has mild non-obstructive disease 3. The Circumflex has mild non-obstructive disease 4. The LIMA to the LAD is patent.  5. The RCA has severe distal disease. The SVG to the PDA is patent.  6. Severe aortic stenosis (peak to peak gradient 17 mmHg, mean gradient 14.2 mmHg by cath). By echo the AVA and dimensionless index support severe aortic  stenosis. The valve is thickened and calcified by echo and appears severely stenotic.  7. Elevated right sided pressures 8. Elevated LVEDP. Recommendations: Continue workup for TAVR. I will ask him to double his Lasix over the next three days.   Echo 11/06/17: Study Conclusions - Left ventricle: The cavity size was mildly dilated. Wall   thickness was normal. Systolic function was mildly to moderately   reduced. The estimated ejection fraction was in the range of 40%   to 45%. Severe hypokinesis of the basal-midinferolateral,   inferior, and inferoseptal myocardium; consistent with infarction   in the distribution of the right coronary artery. Features are   consistent with a pseudonormal left  ventricular filling pattern,   with concomitant abnormal relaxation and increased filling   pressure (grade 2 diastolic dysfunction). - Aortic valve: Right coronary cusp mobility was severely   restricted. Transvalvular velocity was increased less than   expected, due to low cardiac output. There was moderate to severe   stenosis. The aortic jet remains early peaking, suggesting   non-critical stenosis. There was mild regurgitation. - Mitral valve: Calcified annulus. There was moderate regurgitation   directed eccentrically and posteriorly. - Left atrium: The atrium was moderately dilated. - Right ventricle: The cavity size was moderately dilated. Systolic   function was moderately reduced. - Right atrium: The atrium was moderately dilated.  Carotid U/S 01/09/18: Summary: Right Carotid: Velocities in the right ICA are consistent with a 1-39% stenosis. Left Carotid: Velocities in the left ICA are consistent with a 1-39% stenosis. Vertebrals: Bilateral vertebral arteries demonstrate antegrade flow. Subclavians: Normal flow hemodynamics were seen in bilateral subclavian       arteries.  Past Medical History:  Diagnosis Date  . Aortic stenosis   . Bladder cancer ([place])    T1 status post BCG  . 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 Coronary artery disease with history of myocardial infarction without history of CABG    LIMA to the LAD, SVG to RCA 2016 for treatment of the percent left main stenosis and 80% RCA stenosis.    . Depression   . Diabetes mellitus, type II ([place])   . Dyspnea   . Hypertension   . Ischemic cardiomyopathy    EF 40% 2019  . Lung cancer ([place])    Adenocarcinoma of the right lung moderately differentiated  . PVC's (premature ventricular contractions)     Past Surgical History:  Procedure Laterality Date  . CORONARY ARTERY BYPASS GRAFT     2014  . INGUINAL HERNIA REPAIR    . LUNG  REMOVAL, PARTIAL    . RIGHT/LEFT HEART CATH AND CORONARY/GRAFT ANGIOGRAPHY N/A 12/21/2017   Procedure: RIGHT/LEFT HEART CATH AND CORONARY/GRAFT ANGIOGRAPHY;  Surgeon: [name], MD;  Location: [facility];  Service: Cardiovascular;  Laterality: N/A;  . Ulcer surgery      MEDICATIONS: . aspirin EC 81 MG tablet  . ferrous sulfate 325 (65 FE) MG tablet  . furosemide (LASIX) 20 MG tablet  . insulin detemir (LEVEMIR) 100 UNIT/ML injection  . losartan (COZAAR) 25 MG tablet  . lovastatin (MEVACOR) 40 MG tablet  . Melatonin 5 MG TABS  . metoprolol succinate (TOPROL-XL) 25 MG 24 hr tablet  . Multiple Vitamin (MULTIVITAMIN) tablet  . tamsulosin (FLOMAX) 0.4 MG CAPS capsule   No current facility-administered medications for this encounter.     [facility]Anesthesiology Phone [phone] 02/01/2018 4:50  PM       

## 2018-02-01 NOTE — Telephone Encounter (Signed)
  [facility]   MULTIDISCIPLINARY HEART VALVE TEAM  Called into see Mr Austin Lloyd in PAT because ECG showed multifocal PVCs. Reassurance was provided. No changes in plan. Continue Metoprolol succinate 25mg  daily until day of surgery.   Angelena Form PA-C  [facility]

## 2018-02-02 NOTE — Progress Notes (Signed)
Cardiology Office Note   Date:  02/04/2018   ID:  Austin Lloyd, DOB [birth year], MRN [id number]  PCP:  Austin Pal, DO  Cardiologist:   No primary care provider on file. Referring:  Austin Pal, DO   Chief Complaint  Patient presents with  . Aortic Stenosis     History of Present Illness: Austin Lloyd is a 84 y.o. male who presents for follow up of a complicated cardiac history as described below.  He has CAD/CABG, a reduced EF and mild valve disease.  He has CAD as listed below.  He has an EF of 40% on echo.  (It previously had been 15 - 20%)     Echo 05/2017 with moderate AS and mild to moderate MR.  When I saw him recently he had an echo that was found to have apparent severe AS.  TAVR was planned but since then he had follow up CT which was done for pre TAVR evaluation.  He does have a history of lung cancer and there is a perihilar mass.    He had a PET scan which suggested inflammation.  I spoke with the structural heart team today and he has been cleared for the procedure tomorrow.  There is not clear evidence of recurrent cancer and this will be followed appropriately with imaging and per oncology.  He came today for regularly scheduled appointment.  He did have several questions about his instructions which had been reviewed with him last week.  I went over them again with him and answer questions.  We talked about the structure of the structural heart disease team.  We talked about expectations from the therapy and in particular understanding that he had other reasons to be short of breath besides the valve stenosis and we would have to see how he responded symptomatically following his procedure.  He denies any chest pressure, neck or arm discomfort.  He said no new palpitations, presyncope or syncope.   Past Medical History:  Diagnosis Date  . Aortic stenosis   . Bladder cancer ([place])    T1 status post BCG  . 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 Coronary artery disease with history of myocardial infarction without history of CABG    LIMA to the LAD, SVG to RCA 2016 for treatment of the percent left main stenosis and 80% RCA stenosis.    . Depression   . Diabetes mellitus, type II ([place])   . Dyspnea   . Hypertension   . Ischemic cardiomyopathy    EF 40% 2019  . Lung cancer ([place])    Adenocarcinoma of the right lung moderately differentiated  . PVC's (premature ventricular contractions)     Past Surgical History:  Procedure Laterality Date  . CORONARY ARTERY BYPASS GRAFT     2014  . INGUINAL HERNIA REPAIR    . LUNG REMOVAL, PARTIAL    . RIGHT/LEFT HEART CATH AND CORONARY/GRAFT ANGIOGRAPHY N/A 12/21/2017   Procedure: RIGHT/LEFT HEART CATH AND CORONARY/GRAFT ANGIOGRAPHY;  Surgeon: [name], MD;  Location: [facility];  Service: Cardiovascular;  Laterality: N/A;  . Ulcer surgery       Current Outpatient Medications  Medication Sig Dispense Refill  . aspirin EC 81 MG tablet Take 81 mg by mouth every evening.     . ferrous sulfate 325 (65 FE) MG tablet Take 325 mg by mouth daily with breakfast.    .  furosemide (LASIX) 20 MG tablet Take 20 mg by mouth daily.    . insulin detemir (LEVEMIR) 100 UNIT/ML injection Inject 31 Units into the skin daily.     Marland Kitchen losartan (COZAAR) 25 MG tablet Take 12.5 mg by mouth daily.     Marland Kitchen lovastatin (MEVACOR) 40 MG tablet Take 40 mg by mouth at bedtime.    . Melatonin 5 MG TABS Take 5 mg by mouth at bedtime as needed (for sleep).     . metoprolol succinate (TOPROL-XL) 25 MG 24 hr tablet Take 25 mg by mouth 2 (two) times daily.    . Multiple Vitamin (MULTIVITAMIN) tablet Take 1 tablet by mouth daily.    . tamsulosin (FLOMAX) 0.4 MG CAPS capsule Take 1 capsule (0.4 mg total) by mouth daily. 90 capsule 3   No current facility-administered medications for this visit.    Facility-Administered Medications  Ordered in Other Visits  Medication Dose Route Frequency Provider Last Rate Last Dose  . [START ON 02/05/2018] cefUROXime (ZINACEF) 1.5 g in sodium chloride 0.9 % 100 mL IVPB  1.5 g Intravenous To OR [name], MD      . Derrill Memo ON 02/05/2018] dexmedetomidine (PRECEDEX) 400 MCG/100ML (4 mcg/mL) infusion  0.1-0.7 mcg/kg/hr Intravenous To OR [name], MD      . Derrill Memo ON 02/05/2018] DOPamine (INTROPIN) 800 mg in dextrose 5 % 250 mL (3.2 mg/mL) infusion  0-10 mcg/kg/min Intravenous To OR [name], MD      . Derrill Memo ON 02/05/2018] EPINEPHrine (ADRENALIN) 4 mg in dextrose 5 % 250 mL (0.016 mg/mL) infusion  0-10 mcg/min Intravenous To OR [name], MD      . Derrill Memo ON 02/05/2018] heparin 30,000 units/NS 1000 mL solution for CELLSAVER   Other To OR [name], MD      . Derrill Memo ON 02/05/2018] insulin regular, human (MYXREDLIN) 100 units/ 100 mL infusion   Intravenous To OR [name], MD      . Derrill Memo ON 02/05/2018] magnesium sulfate (IV Push/IM) injection 40 mEq  40 mEq Other To OR [name], MD      . Derrill Memo ON 02/05/2018] nitroGLYCERIN 50 mg in dextrose 5 % 250 mL (0.2 mg/mL) infusion  2-200 mcg/min Intravenous To OR [name], MD      . Derrill Memo ON 02/05/2018] norepinephrine (LEVOPHED) 4mg  in D5W 227mL premix infusion  0-10 mcg/min Intravenous To OR [name], MD      . Derrill Memo ON 02/05/2018] phenylephrine (NEOSYNEPHRINE) 20-0.9 MG/250ML-% infusion  30-200 mcg/min Intravenous To OR [name], MD      . Derrill Memo ON 02/05/2018] potassium chloride injection 80 mEq  80 mEq Other To OR [name], MD      . Derrill Memo ON 02/05/2018] vancomycin (VANCOCIN) 1,250 mg in sodium chloride 0.9 % 250 mL IVPB  1,250 mg Intravenous To OR [name], MD        Allergies:   Patient has no known allergies.    ROS:  Please see the history of present illness.    Otherwise, review of systems are positive for none.   All other systems are reviewed and negative.    PHYSICAL EXAM: VS:  BP 130/80   Pulse 72   Ht 5\' 4"  (1.626 m)   Wt 152 lb 12.8 oz (69.3 kg)   SpO2 93%   BMI 26.23 kg/m  , BMI Body mass index is 26.23 kg/m. GENERAL:  Well appearing for his  age NECK:  No jugular venous distention, waveform within normal limits, carotid upstroke brisk and symmetric, no bruits, no thyromegaly LUNGS:  Clear to auscultation bilaterally CHEST:  Unremarkable HEART:  PMI not displaced or sustained,S1 and S2 within normal limits, no S3, no S4, no clicks, no rubs, 3 out of 6 apical mid to late peaking systolic murmur radiating out the aortic outflow tract, no diastolic murmurs ABD:  Flat, positive bowel sounds normal in frequency in pitch, no bruits, no rebound, no guarding, no midline pulsatile mass, no hepatomegaly, no splenomegaly EXT:  2 plus pulses throughout, no edema, no cyanosis no clubbing   EKG:  EKG is not ordered today.    Recent Labs: 11/06/2017: TSH 2.500 02/01/2018: ALT 23; B Natriuretic Peptide 447.9; BUN 39; Creatinine, Ser 1.52; Hemoglobin 13.3; Platelets 180; Potassium 4.8; Sodium 138    Lipid Panel    Component Value Date/Time   CHOL 183 11/06/2017 0815   TRIG 86 11/06/2017 0815   HDL 58 11/06/2017 0815   CHOLHDL 3.2 11/06/2017 0815   LDLCALC 108 (H) 11/06/2017 0815      Wt Readings from Last 3 Encounters:  02/04/18 152 lb 12.8 oz (69.3 kg)  02/01/18 152 lb 3.2 oz (69 kg)  01/10/18 150 lb (68 kg)      Other studies Reviewed: Additional studies/ records that were reviewed today include: CT and PET  ASSESSMENT AND PLAN:  AS:  Severe.  The plan is for TAVR.   All instructions had previously been reviewed with him.  However they still had multiple questions and I addressed these.  He will percent for his procedure tomorrow.  No change in therapy.  MR:   This was moderate.  I will manage this conservatively.   I reviewed  this with him today and told him this would not be addressed tomorrow.  I will manage this medically.  CAD/CABG:    He is not having any angina.   He had patent grafts on recent cath.   No change in therapy.  ISCHEMIC CARDIOMYOPATHY:      EF is slightly reduced but I will follow this clinically and he seems to be euvolemic..  CKD III:    Creatinine is stable with his last reading being 1.39.  No change in therapy.  PVCs:   He is not feeling these.  No change in therapy.  Current medicines are reviewed at length with the patient today.  The patient does not have concerns regarding medicines.  The following changes have been made:  None  Labs/ tests ordered today include: None  No orders of the defined types were placed in this encounter.    Disposition:   FU with me as suggested by the structural team after he has completed his procedure and necessary follow up.   Signed, [name], MD  02/04/2018 11:42 AM    [facility]

## 2018-02-04 ENCOUNTER — Encounter: Payer: Self-pay | Admitting: Cardiology

## 2018-02-04 ENCOUNTER — Ambulatory Visit (INDEPENDENT_AMBULATORY_CARE_PROVIDER_SITE_OTHER): Payer: Medicare Other | Admitting: Cardiology

## 2018-02-04 VITALS — BP 130/80 | HR 72 | Ht 64.0 in | Wt 152.8 lb

## 2018-02-04 DIAGNOSIS — I350 Nonrheumatic aortic (valve) stenosis: Secondary | ICD-10-CM

## 2018-02-04 DIAGNOSIS — I340 Nonrheumatic mitral (valve) insufficiency: Secondary | ICD-10-CM

## 2018-02-04 DIAGNOSIS — I255 Ischemic cardiomyopathy: Secondary | ICD-10-CM | POA: Diagnosis not present

## 2018-02-04 DIAGNOSIS — I2510 Atherosclerotic heart disease of native coronary artery without angina pectoris: Secondary | ICD-10-CM

## 2018-02-04 MED ORDER — MAGNESIUM SULFATE 50 % IJ SOLN
40.0000 meq | INTRAMUSCULAR | Status: DC
  Filled 2018-02-04: qty 9.85

## 2018-02-04 MED ORDER — SODIUM CHLORIDE 0.9 % IV SOLN
1.5000 g | INTRAVENOUS | Status: AC
  Administered 2018-02-05: 1.5 g via INTRAVENOUS
  Filled 2018-02-04: qty 1.5

## 2018-02-04 MED ORDER — PHENYLEPHRINE HCL-NACL 20-0.9 MG/250ML-% IV SOLN
30.0000 ug/min | INTRAVENOUS | Status: DC
  Filled 2018-02-04: qty 250

## 2018-02-04 MED ORDER — SODIUM CHLORIDE 0.9 % IV SOLN
INTRAVENOUS | Status: DC
  Filled 2018-02-04: qty 30

## 2018-02-04 MED ORDER — INSULIN REGULAR(HUMAN) IN NACL 100-0.9 UT/100ML-% IV SOLN
INTRAVENOUS | Status: DC
  Filled 2018-02-04: qty 100

## 2018-02-04 MED ORDER — DOPAMINE-DEXTROSE 3.2-5 MG/ML-% IV SOLN
0.0000 ug/kg/min | INTRAVENOUS | Status: DC
  Filled 2018-02-04: qty 250

## 2018-02-04 MED ORDER — NITROGLYCERIN IN D5W 200-5 MCG/ML-% IV SOLN
2.0000 ug/min | INTRAVENOUS | Status: DC
  Filled 2018-02-04: qty 250

## 2018-02-04 MED ORDER — VANCOMYCIN HCL 10 G IV SOLR
1250.0000 mg | INTRAVENOUS | Status: AC
  Administered 2018-02-05: 1250 mg via INTRAVENOUS
  Filled 2018-02-04: qty 1250

## 2018-02-04 MED ORDER — NOREPINEPHRINE 4 MG/250ML-% IV SOLN
0.0000 ug/min | INTRAVENOUS | Status: DC
  Filled 2018-02-04: qty 250

## 2018-02-04 MED ORDER — POTASSIUM CHLORIDE 2 MEQ/ML IV SOLN
80.0000 meq | INTRAVENOUS | Status: DC
  Filled 2018-02-04: qty 40

## 2018-02-04 MED ORDER — DEXMEDETOMIDINE HCL IN NACL 400 MCG/100ML IV SOLN
0.1000 ug/kg/h | INTRAVENOUS | Status: AC
  Administered 2018-02-05: 1 ug/kg/h via INTRAVENOUS
  Filled 2018-02-04: qty 100

## 2018-02-04 MED ORDER — EPINEPHRINE PF 1 MG/ML IJ SOLN
0.0000 ug/min | INTRAVENOUS | Status: DC
  Filled 2018-02-04: qty 4

## 2018-02-04 NOTE — Anesthesia Preprocedure Evaluation (Addendum)
Anesthesia Evaluation  Patient identified by MRN, date of birth, ID band Patient awake    Reviewed: Allergy & Precautions, NPO status , Patient's Chart, lab work & pertinent test results, reviewed documented beta blocker date and time   Airway Mallampati: IV  TM Distance: <3 FB Neck ROM: Full    Dental  (+) Upper Dentures, Lower Dentures   Pulmonary former smoker,    breath sounds clear to auscultation       Cardiovascular hypertension, Pt. on medications and Pt. on home beta blockers + CAD and + CABG  + Valvular Problems/Murmurs AS  Rhythm:Irregular + Systolic murmurs    Neuro/Psych PSYCHIATRIC DISORDERS Depression negative neurological ROS     GI/Hepatic negative GI ROS, Neg liver ROS,   Endo/Other  diabetes  Renal/GU negative Renal ROS     Musculoskeletal   Abdominal   Peds  Hematology negative hematology ROS (+)   Anesthesia Other Findings 2019 tte: - Left ventricle: The cavity size was mildly dilated. Wall   thickness was normal. Systolic function was mildly to moderately   reduced. The estimated ejection fraction was in the range of 40%   to 45%. Severe hypokinesis of the basal-midinferolateral,   inferior, and inferoseptal myocardium; consistent with infarction   in the distribution of the right coronary artery. Features are   consistent with a pseudonormal left ventricular filling pattern,   with concomitant abnormal relaxation and increased filling   pressure (grade 2 diastolic dysfunction). - Aortic valve: Right coronary cusp mobility was severely   restricted. Transvalvular velocity was increased less than   expected, due to low cardiac output. There was moderate to severe   stenosis. The aortic jet remains early peaking, suggesting   non-critical stenosis. There was mild regurgitation. - Mitral valve: Calcified annulus. There was moderate regurgitation   directed eccentrically and posteriorly. -  Left atrium: The atrium was moderately dilated. - Right ventricle: The cavity size was moderately dilated. Systolic   function was moderately reduced. - Right atrium: The atrium was moderately dilated.  2019 cath:  Dist RCA lesion is 99% stenosed.  Mid RCA lesion is 70% stenosed.  SVG graft was visualized by angiography and is normal in caliber.  Ost LM to Mid LM lesion is 80% stenosed.  LIMA graft was visualized by angiography and is normal in caliber.  Prox Cx to Dist Cx lesion is 20% stenosed.  Prox LAD lesion is 20% stenosed.  Hemodynamic findings consistent with moderate pulmonary hypertension.   1. Severe ostial left main stenosis 2. The LAD has mild non-obstructive disease 3. The Circumflex has mild non-obstructive disease 4. The LIMA to the LAD is patent.  5. The RCA has severe distal disease. The SVG to the PDA is patent.  6. Severe aortic stenosis (peak to peak gradient 17 mmHg, mean gradient 14.2 mmHg by cath). By echo the AVA and dimensionless index support severe aortic stenosis. The valve is thickened and calcified by echo and appears severely stenotic.  7. Elevated right sided pressures 8. Elevated LVEDP.  Reproductive/Obstetrics                            Anesthesia Physical Anesthesia Plan  ASA: IV  Anesthesia Plan: MAC   Post-op Pain Management:    Induction: Intravenous  PONV Risk Score and Plan: 1 and Treatment may vary due to age or medical condition  Airway Management Planned: Nasal Cannula  Additional Equipment: Arterial line, CVP and Ultrasound  Guidance Line Placement  Intra-op Plan:   Post-operative Plan:   Informed Consent: I have reviewed the patients History and Physical, chart, labs and discussed the procedure including the risks, benefits and alternatives for the proposed anesthesia with the patient or authorized representative who has indicated his/her understanding and acceptance.   Dental advisory  given  Plan Discussed with: CRNA and Surgeon  Anesthesia Plan Comments: (PAT note written 02/04/2018 by Myra Gianotti, PA-C. )       Anesthesia Quick Evaluation

## 2018-02-04 NOTE — Patient Instructions (Signed)
Medication Instructions:  Continue current medications  If you need a refill on your cardiac medications before your next appointment, please call your pharmacy.  Labwork: None ordered   If you have labs (blood work) drawn today and your tests are completely normal, you will receive your results only by: Marland Kitchen MyChart Message (if you have MyChart) OR . A paper copy in the mail If you have any lab test that is abnormal or we need to change your treatment, we will call you to review the results.  Testing/Procedures: None Ordered   Follow-Up: . You will need a follow up appointment in As per heart team.   At [facility], you and your health needs are our priority.  As part of our continuing mission to provide you with exceptional heart care, we have created designated Provider Care Teams.  These Care Teams include your primary Cardiologist (physician) and Advanced Practice Providers (APPs -  Physician Assistants and Nurse Practitioners) who all work together to provide you with the care you need, when you need it.   Thank you for choosing [facility] at [facility]!!

## 2018-02-05 ENCOUNTER — Encounter (HOSPITAL_COMMUNITY): Payer: Self-pay

## 2018-02-05 ENCOUNTER — Inpatient Hospital Stay (HOSPITAL_COMMUNITY): Payer: Medicare Other

## 2018-02-05 ENCOUNTER — Other Ambulatory Visit: Payer: Self-pay | Admitting: Physician Assistant

## 2018-02-05 ENCOUNTER — Inpatient Hospital Stay (HOSPITAL_COMMUNITY): Payer: Medicare Other | Admitting: Certified Registered Nurse Anesthetist

## 2018-02-05 ENCOUNTER — Inpatient Hospital Stay (HOSPITAL_COMMUNITY): Payer: Medicare Other | Admitting: Vascular Surgery

## 2018-02-05 ENCOUNTER — Other Ambulatory Visit: Payer: Self-pay

## 2018-02-05 ENCOUNTER — Encounter (HOSPITAL_COMMUNITY)
Admission: RE | Disposition: A | Discharge status: Home / Self Care | Payer: Self-pay | Source: Home / Self Care | Attending: Cardiovascular Disease

## 2018-02-05 ENCOUNTER — Inpatient Hospital Stay (HOSPITAL_COMMUNITY)
Admission: RE | Admit: 2018-02-05 | Discharge: 2018-02-06 | DRG: 266 | Disposition: A | Discharge status: Home / Self Care | Payer: Medicare Other | Attending: Cardiovascular Disease | Admitting: Cardiovascular Disease

## 2018-02-05 DIAGNOSIS — Z902 Acquired absence of lung [part of]: Secondary | ICD-10-CM | POA: Diagnosis not present

## 2018-02-05 DIAGNOSIS — Z85118 Personal history of other malignant neoplasm of bronchus and lung: Secondary | ICD-10-CM

## 2018-02-05 DIAGNOSIS — Z952 Presence of prosthetic heart valve: Secondary | ICD-10-CM

## 2018-02-05 DIAGNOSIS — I350 Nonrheumatic aortic (valve) stenosis: Secondary | ICD-10-CM | POA: Diagnosis not present

## 2018-02-05 DIAGNOSIS — I130 Hypertensive heart and chronic kidney disease with heart failure and stage 1 through stage 4 chronic kidney disease, or unspecified chronic kidney disease: Secondary | ICD-10-CM | POA: Diagnosis present

## 2018-02-05 DIAGNOSIS — Z8551 Personal history of malignant neoplasm of bladder: Secondary | ICD-10-CM

## 2018-02-05 DIAGNOSIS — I255 Ischemic cardiomyopathy: Secondary | ICD-10-CM | POA: Diagnosis not present

## 2018-02-05 DIAGNOSIS — C3490 Malignant neoplasm of unspecified part of unspecified bronchus or lung: Secondary | ICD-10-CM | POA: Diagnosis present

## 2018-02-05 DIAGNOSIS — Z923 Personal history of irradiation: Secondary | ICD-10-CM

## 2018-02-05 DIAGNOSIS — I2510 Atherosclerotic heart disease of native coronary artery without angina pectoris: Secondary | ICD-10-CM | POA: Diagnosis not present

## 2018-02-05 DIAGNOSIS — Z856 Personal history of leukemia: Secondary | ICD-10-CM | POA: Diagnosis not present

## 2018-02-05 DIAGNOSIS — Z794 Long term (current) use of insulin: Secondary | ICD-10-CM

## 2018-02-05 DIAGNOSIS — R918 Other nonspecific abnormal finding of lung field: Secondary | ICD-10-CM | POA: Diagnosis present

## 2018-02-05 DIAGNOSIS — Z006 Encounter for examination for normal comparison and control in clinical research program: Secondary | ICD-10-CM

## 2018-02-05 DIAGNOSIS — E785 Hyperlipidemia, unspecified: Secondary | ICD-10-CM | POA: Diagnosis present

## 2018-02-05 DIAGNOSIS — Z9221 Personal history of antineoplastic chemotherapy: Secondary | ICD-10-CM

## 2018-02-05 DIAGNOSIS — I5043 Acute on chronic combined systolic (congestive) and diastolic (congestive) heart failure: Secondary | ICD-10-CM | POA: Diagnosis not present

## 2018-02-05 DIAGNOSIS — Z951 Presence of aortocoronary bypass graft: Secondary | ICD-10-CM

## 2018-02-05 DIAGNOSIS — Z808 Family history of malignant neoplasm of other organs or systems: Secondary | ICD-10-CM

## 2018-02-05 DIAGNOSIS — C679 Malignant neoplasm of bladder, unspecified: Secondary | ICD-10-CM | POA: Diagnosis present

## 2018-02-05 DIAGNOSIS — E119 Type 2 diabetes mellitus without complications: Secondary | ICD-10-CM

## 2018-02-05 DIAGNOSIS — Z7982 Long term (current) use of aspirin: Secondary | ICD-10-CM

## 2018-02-05 DIAGNOSIS — C9110 Chronic lymphocytic leukemia of B-cell type not having achieved remission: Secondary | ICD-10-CM | POA: Diagnosis present

## 2018-02-05 DIAGNOSIS — I252 Old myocardial infarction: Secondary | ICD-10-CM | POA: Diagnosis not present

## 2018-02-05 DIAGNOSIS — I340 Nonrheumatic mitral (valve) insufficiency: Secondary | ICD-10-CM | POA: Diagnosis not present

## 2018-02-05 DIAGNOSIS — E1122 Type 2 diabetes mellitus with diabetic chronic kidney disease: Secondary | ICD-10-CM | POA: Diagnosis present

## 2018-02-05 DIAGNOSIS — N183 Chronic kidney disease, stage 3 (moderate): Secondary | ICD-10-CM | POA: Diagnosis present

## 2018-02-05 DIAGNOSIS — I10 Essential (primary) hypertension: Secondary | ICD-10-CM | POA: Diagnosis present

## 2018-02-05 DIAGNOSIS — Z8546 Personal history of malignant neoplasm of prostate: Secondary | ICD-10-CM | POA: Diagnosis not present

## 2018-02-05 DIAGNOSIS — I429 Cardiomyopathy, unspecified: Secondary | ICD-10-CM

## 2018-02-05 HISTORY — PX: INTRAOPERATIVE TRANSTHORACIC ECHOCARDIOGRAM: SHX6523

## 2018-02-05 HISTORY — DX: Presence of prosthetic heart valve: Z95.2

## 2018-02-05 HISTORY — DX: Other nonspecific abnormal finding of lung field: R91.8

## 2018-02-05 HISTORY — PX: TRANSCATHETER AORTIC VALVE REPLACEMENT, TRANSFEMORAL: SHX6400

## 2018-02-05 LAB — POCT I-STAT, CHEM 8
BUN: 40 mg/dL — AB (ref 8–23)
BUN: 38 mg/dL — AB (ref 8–23)
BUN: 39 mg/dL — AB (ref 8–23)
BUN: 37 mg/dL — ABNORMAL HIGH (ref 8–23)
CALCIUM ION: 1.21 mmol/L (ref 1.15–1.40)
CHLORIDE: 108 mmol/L (ref 98–111)
CHLORIDE: 105 mmol/L (ref 98–111)
CHLORIDE: 105 mmol/L (ref 98–111)
CREATININE: 1.4 mg/dL — AB (ref 0.61–1.24)
CREATININE: 1.4 mg/dL — AB (ref 0.61–1.24)
CREATININE: 1.4 mg/dL — AB (ref 0.61–1.24)
Calcium, Ion: 1.24 mmol/L (ref 1.15–1.40)
Calcium, Ion: 1.27 mmol/L (ref 1.15–1.40)
Calcium, Ion: 1.28 mmol/L (ref 1.15–1.40)
Chloride: 107 mmol/L (ref 98–111)
Creatinine, Ser: 1.4 mg/dL — ABNORMAL HIGH (ref 0.61–1.24)
GLUCOSE: 211 mg/dL — AB (ref 70–99)
GLUCOSE: 221 mg/dL — AB (ref 70–99)
GLUCOSE: 222 mg/dL — AB (ref 70–99)
GLUCOSE: 213 mg/dL — AB (ref 70–99)
HCT: 34 % — ABNORMAL LOW (ref 39.0–52.0)
HCT: 31 % — ABNORMAL LOW (ref 39.0–52.0)
HCT: 32 % — ABNORMAL LOW (ref 39.0–52.0)
HCT: 30 % — ABNORMAL LOW (ref 39.0–52.0)
HEMOGLOBIN: 11.6 g/dL — AB (ref 13.0–17.0)
HEMOGLOBIN: 10.2 g/dL — AB (ref 13.0–17.0)
Hemoglobin: 10.5 g/dL — ABNORMAL LOW (ref 13.0–17.0)
Hemoglobin: 10.9 g/dL — ABNORMAL LOW (ref 13.0–17.0)
POTASSIUM: 5.1 mmol/L (ref 3.5–5.1)
POTASSIUM: 5.5 mmol/L — AB (ref 3.5–5.1)
Potassium: 5.6 mmol/L — ABNORMAL HIGH (ref 3.5–5.1)
Potassium: 5.4 mmol/L — ABNORMAL HIGH (ref 3.5–5.1)
Sodium: 139 mmol/L (ref 135–145)
Sodium: 138 mmol/L (ref 135–145)
Sodium: 139 mmol/L (ref 135–145)
Sodium: 139 mmol/L (ref 135–145)
TCO2: 27 mmol/L (ref 22–32)
TCO2: 27 mmol/L (ref 22–32)
TCO2: 28 mmol/L (ref 22–32)
TCO2: 24 mmol/L (ref 22–32)

## 2018-02-05 LAB — GLUCOSE, CAPILLARY
GLUCOSE-CAPILLARY: 193 mg/dL — AB (ref 70–99)
Glucose-Capillary: 164 mg/dL — ABNORMAL HIGH (ref 70–99)
Glucose-Capillary: 141 mg/dL — ABNORMAL HIGH (ref 70–99)
Glucose-Capillary: 176 mg/dL — ABNORMAL HIGH (ref 70–99)

## 2018-02-05 SURGERY — IMPLANTATION, AORTIC VALVE, TRANSCATHETER, FEMORAL APPROACH
Anesthesia: Monitor Anesthesia Care | Site: Chest

## 2018-02-05 MED ORDER — OXYCODONE HCL 5 MG PO TABS: 5.0000 mg | ORAL_TABLET | ORAL | Status: DC | PRN

## 2018-02-05 MED ORDER — FERROUS SULFATE 325 (65 FE) MG PO TABS
325.0000 mg | ORAL_TABLET | Freq: Every day | ORAL | Status: DC
  Administered 2018-02-06: 325 mg via ORAL
  Filled 2018-02-05: qty 1

## 2018-02-05 MED ORDER — CHLORHEXIDINE GLUCONATE CLOTH 2 % EX PADS: 6.0000 | MEDICATED_PAD | Freq: Every day | CUTANEOUS | Status: DC

## 2018-02-05 MED ORDER — MIDAZOLAM HCL 5 MG/5ML IJ SOLN
INTRAMUSCULAR | Status: DC | PRN
  Administered 2018-02-05: 1 mg via INTRAVENOUS

## 2018-02-05 MED ORDER — PROTAMINE SULFATE 10 MG/ML IV SOLN
INTRAVENOUS | Status: DC | PRN
  Administered 2018-02-05: 70 mg via INTRAVENOUS

## 2018-02-05 MED ORDER — ACETAMINOPHEN 325 MG PO TABS: 650.0000 mg | ORAL_TABLET | Freq: Four times a day (QID) | ORAL | Status: DC | PRN

## 2018-02-05 MED ORDER — ACETAMINOPHEN 650 MG RE SUPP: 650.0000 mg | Freq: Four times a day (QID) | RECTAL | Status: DC | PRN

## 2018-02-05 MED ORDER — PHENYLEPHRINE HCL-NACL 20-0.9 MG/250ML-% IV SOLN: 0.0000 ug/min | INTRAVENOUS | Status: DC

## 2018-02-05 MED ORDER — SODIUM CHLORIDE 0.9 % IV SOLN: 250.0000 mL | INTRAVENOUS | Status: DC | PRN

## 2018-02-05 MED ORDER — LIDOCAINE HCL 1 % IJ SOLN
INTRAMUSCULAR | Status: AC
  Filled 2018-02-05: qty 20

## 2018-02-05 MED ORDER — CHLORHEXIDINE GLUCONATE 0.12 % MT SOLN: 15.0000 mL | Freq: Once | OROMUCOSAL | Status: DC

## 2018-02-05 MED ORDER — ONDANSETRON HCL 4 MG/2ML IJ SOLN: 4.0000 mg | Freq: Four times a day (QID) | INTRAMUSCULAR | Status: DC | PRN

## 2018-02-05 MED ORDER — HEPARIN SODIUM (PORCINE) 1000 UNIT/ML IJ SOLN
INTRAMUSCULAR | Status: AC
  Filled 2018-02-05: qty 1

## 2018-02-05 MED ORDER — INSULIN ASPART 100 UNIT/ML ~~LOC~~ SOLN
0.0000 [IU] | Freq: Three times a day (TID) | SUBCUTANEOUS | Status: DC
  Administered 2018-02-05 (×2): 4 [IU] via SUBCUTANEOUS
  Administered 2018-02-05 – 2018-02-06 (×2): 2 [IU] via SUBCUTANEOUS
  Administered 2018-02-06: 16 [IU] via SUBCUTANEOUS

## 2018-02-05 MED ORDER — FENTANYL CITRATE (PF) 100 MCG/2ML IJ SOLN
INTRAMUSCULAR | Status: DC | PRN
  Administered 2018-02-05: 50 ug via INTRAVENOUS
  Administered 2018-02-05 (×2): 25 ug via INTRAVENOUS

## 2018-02-05 MED ORDER — HEPARIN SODIUM (PORCINE) 1000 UNIT/ML IJ SOLN
INTRAMUSCULAR | Status: DC | PRN
  Administered 2018-02-05: 7000 [IU] via INTRAVENOUS

## 2018-02-05 MED ORDER — CLOPIDOGREL BISULFATE 75 MG PO TABS
75.0000 mg | ORAL_TABLET | Freq: Every day | ORAL | Status: DC
  Administered 2018-02-06: 75 mg via ORAL
  Filled 2018-02-05: qty 1

## 2018-02-05 MED ORDER — SODIUM CHLORIDE 0.9 % IV SOLN
INTRAVENOUS | Status: DC | PRN
  Administered 2018-02-05: 500 mL

## 2018-02-05 MED ORDER — LIDOCAINE 2% (20 MG/ML) 5 ML SYRINGE
INTRAMUSCULAR | Status: AC
  Filled 2018-02-05: qty 5

## 2018-02-05 MED ORDER — IODIXANOL 320 MG/ML IV SOLN
INTRAVENOUS | Status: DC | PRN
  Administered 2018-02-05: 28.42 mL via INTRAVENOUS

## 2018-02-05 MED ORDER — SODIUM CHLORIDE 0.9 % IV SOLN
INTRAVENOUS | Status: AC
  Filled 2018-02-05 (×3): qty 1.2

## 2018-02-05 MED ORDER — VANCOMYCIN HCL IN DEXTROSE 1-5 GM/200ML-% IV SOLN
1000.0000 mg | Freq: Once | INTRAVENOUS | Status: AC
  Administered 2018-02-05: 1000 mg via INTRAVENOUS
  Filled 2018-02-05: qty 200

## 2018-02-05 MED ORDER — MORPHINE SULFATE (PF) 2 MG/ML IV SOLN: 2.0000 mg | INTRAVENOUS | Status: DC | PRN

## 2018-02-05 MED ORDER — CHLORHEXIDINE GLUCONATE 4 % EX LIQD: 60.0000 mL | Freq: Once | CUTANEOUS | Status: DC

## 2018-02-05 MED ORDER — MELATONIN 3 MG PO TABS
6.0000 mg | ORAL_TABLET | Freq: Every evening | ORAL | Status: DC | PRN
  Administered 2018-02-05: 6 mg via ORAL
  Filled 2018-02-05 (×2): qty 2

## 2018-02-05 MED ORDER — DEXMEDETOMIDINE HCL IN NACL 200 MCG/50ML IV SOLN
INTRAVENOUS | Status: DC | PRN
  Administered 2018-02-05: 67.6 ug via INTRAVENOUS

## 2018-02-05 MED ORDER — METOPROLOL TARTRATE 5 MG/5ML IV SOLN: 2.5000 mg | INTRAVENOUS | Status: DC | PRN

## 2018-02-05 MED ORDER — SODIUM CHLORIDE 0.9% FLUSH: 3.0000 mL | INTRAVENOUS | Status: DC | PRN

## 2018-02-05 MED ORDER — SODIUM CHLORIDE 0.9 % IV SOLN: INTRAVENOUS | Status: DC

## 2018-02-05 MED ORDER — ASPIRIN EC 81 MG PO TBEC: 81.0000 mg | DELAYED_RELEASE_TABLET | Freq: Every evening | ORAL | Status: DC

## 2018-02-05 MED ORDER — LIDOCAINE HCL (PF) 1 % IJ SOLN
INTRAMUSCULAR | Status: DC | PRN
  Administered 2018-02-05: 20 mL

## 2018-02-05 MED ORDER — SUCCINYLCHOLINE CHLORIDE 200 MG/10ML IV SOSY
PREFILLED_SYRINGE | INTRAVENOUS | Status: AC
  Filled 2018-02-05: qty 10

## 2018-02-05 MED ORDER — FENTANYL CITRATE (PF) 250 MCG/5ML IJ SOLN
INTRAMUSCULAR | Status: AC
  Filled 2018-02-05: qty 5

## 2018-02-05 MED ORDER — SODIUM CHLORIDE 0.9 % IV SOLN
INTRAVENOUS | Status: DC
  Administered 2018-02-05: 50 mL/h via INTRAVENOUS

## 2018-02-05 MED ORDER — PRAVASTATIN SODIUM 40 MG PO TABS
40.0000 mg | ORAL_TABLET | Freq: Every day | ORAL | Status: DC
  Administered 2018-02-05: 40 mg via ORAL
  Filled 2018-02-05: qty 1

## 2018-02-05 MED ORDER — CHLORHEXIDINE GLUCONATE 4 % EX LIQD: 30.0000 mL | CUTANEOUS | Status: DC

## 2018-02-05 MED ORDER — MUPIROCIN 2 % EX OINT
1.0000 "application " | TOPICAL_OINTMENT | Freq: Two times a day (BID) | CUTANEOUS | Status: DC
  Administered 2018-02-05 – 2018-02-06 (×2): 1 via NASAL
  Filled 2018-02-05: qty 22

## 2018-02-05 MED ORDER — PROPOFOL 10 MG/ML IV BOLUS
INTRAVENOUS | Status: AC
  Filled 2018-02-05: qty 20

## 2018-02-05 MED ORDER — TAMSULOSIN HCL 0.4 MG PO CAPS: 0.4000 mg | ORAL_CAPSULE | Freq: Every day | ORAL | Status: DC

## 2018-02-05 MED ORDER — NITROGLYCERIN IN D5W 200-5 MCG/ML-% IV SOLN: 0.0000 ug/min | INTRAVENOUS | Status: DC

## 2018-02-05 MED ORDER — SODIUM CHLORIDE 0.9% FLUSH
3.0000 mL | Freq: Two times a day (BID) | INTRAVENOUS | Status: DC
  Administered 2018-02-05 – 2018-02-06 (×2): 3 mL via INTRAVENOUS

## 2018-02-05 MED ORDER — TAMSULOSIN HCL 0.4 MG PO CAPS
0.4000 mg | ORAL_CAPSULE | Freq: Every day | ORAL | Status: DC
  Administered 2018-02-05: 0.4 mg via ORAL
  Filled 2018-02-05: qty 1

## 2018-02-05 MED ORDER — LACTATED RINGERS IV SOLN
INTRAVENOUS | Status: DC | PRN
  Administered 2018-02-05: 07:00:00 via INTRAVENOUS

## 2018-02-05 MED ORDER — SODIUM CHLORIDE (PF) 0.9 % IJ SOLN
INTRAMUSCULAR | Status: AC
  Filled 2018-02-05: qty 10

## 2018-02-05 MED ORDER — MIDAZOLAM HCL 2 MG/2ML IJ SOLN
INTRAMUSCULAR | Status: AC
  Filled 2018-02-05: qty 2

## 2018-02-05 MED ORDER — SODIUM CHLORIDE 0.9 % IV SOLN
1.5000 g | Freq: Two times a day (BID) | INTRAVENOUS | Status: DC
  Administered 2018-02-05 – 2018-02-06 (×2): 1.5 g via INTRAVENOUS
  Filled 2018-02-05 (×5): qty 1.5

## 2018-02-05 MED ORDER — TRAMADOL HCL 50 MG PO TABS: 50.0000 mg | ORAL_TABLET | ORAL | Status: DC | PRN

## 2018-02-05 SURGICAL SUPPLY — 89 items
BAG DECANTER FOR FLEXI CONT (MISCELLANEOUS) IMPLANT
BAG SNAP BAND KOVER 36X36 (MISCELLANEOUS) ×5 IMPLANT
BLADE CLIPPER SURG (BLADE) IMPLANT
BLADE OSCILLATING /SAGITTAL (BLADE) IMPLANT
BLADE STERNUM SYSTEM 6 (BLADE) IMPLANT
CABLE ADAPT CONN TEMP 6FT (ADAPTER) ×5 IMPLANT
CANNULA FEM VENOUS REMOTE 22FR (CANNULA) IMPLANT
CANNULA OPTISITE PERFUSION 16F (CANNULA) IMPLANT
CANNULA OPTISITE PERFUSION 18F (CANNULA) IMPLANT
CATH DIAG EXPO 6F VENT PIG 145 (CATHETERS) ×10 IMPLANT
CATH EXPO 5FR AL1 (CATHETERS) IMPLANT
CATH INFINITI 6F AL2 (CATHETERS) IMPLANT
CATH S G BIP PACING (SET/KITS/TRAYS/PACK) ×5 IMPLANT
CLIP VESOCCLUDE MED 24/CT (CLIP) IMPLANT
CLIP VESOCCLUDE SM WIDE 24/CT (CLIP) IMPLANT
CLOSURE MYNX CONTROL 6F/7F (Vascular Products) ×10 IMPLANT
CONT SPEC 4OZ CLIKSEAL STRL BL (MISCELLANEOUS) ×10 IMPLANT
COVER BACK TABLE 24X17X13 BIG (DRAPES) IMPLANT
COVER BACK TABLE 80X110 HD (DRAPES) ×5 IMPLANT
COVER DOME SNAP 22 D (MISCELLANEOUS) IMPLANT
COVER WAND RF STERILE (DRAPES) ×5 IMPLANT
CRADLE DONUT ADULT HEAD (MISCELLANEOUS) ×5 IMPLANT
DERMABOND ADVANCED (GAUZE/BANDAGES/DRESSINGS) ×2
DERMABOND ADVANCED .7 DNX12 (GAUZE/BANDAGES/DRESSINGS) ×3 IMPLANT
DEVICE CLOSURE PERCLS PRGLD 6F (VASCULAR PRODUCTS) ×6 IMPLANT
DRAPE INCISE IOBAN 66X45 STRL (DRAPES) IMPLANT
DRSG TEGADERM 2-3/8X2-3/4 SM (GAUZE/BANDAGES/DRESSINGS) ×5 IMPLANT
DRSG TEGADERM 4X4.75 (GAUZE/BANDAGES/DRESSINGS) ×10 IMPLANT
ELECT CAUTERY BLADE 6.4 (BLADE) IMPLANT
ELECT REM PT RETURN 9FT ADLT (ELECTROSURGICAL) ×10
ELECTRODE REM PT RTRN 9FT ADLT (ELECTROSURGICAL) ×6 IMPLANT
FELT TEFLON 6X6 (MISCELLANEOUS) IMPLANT
FEMORAL VENOUS CANN RAP (CANNULA) IMPLANT
GAUZE SPONGE 2X2 8PLY STRL LF (GAUZE/BANDAGES/DRESSINGS) ×3 IMPLANT
GAUZE SPONGE 4X4 12PLY STRL (GAUZE/BANDAGES/DRESSINGS) ×5 IMPLANT
GLOVE BIO SURGEON STRL SZ7.5 (GLOVE) ×5 IMPLANT
GLOVE BIO SURGEON STRL SZ8 (GLOVE) IMPLANT
GLOVE EUDERMIC 7 POWDERFREE (GLOVE) IMPLANT
GLOVE ORTHO TXT STRL SZ7.5 (GLOVE) IMPLANT
GOWN STRL REUS W/ TWL LRG LVL3 (GOWN DISPOSABLE) IMPLANT
GOWN STRL REUS W/ TWL XL LVL3 (GOWN DISPOSABLE) ×3 IMPLANT
GOWN STRL REUS W/TWL LRG LVL3 (GOWN DISPOSABLE)
GOWN STRL REUS W/TWL XL LVL3 (GOWN DISPOSABLE) ×2
GUIDEWIRE SAFE TJ AMPLATZ EXST (WIRE) ×5 IMPLANT
GUIDEWIRE STRAIGHT .035 260CM (WIRE) ×5 IMPLANT
INSERT FOGARTY SM (MISCELLANEOUS) IMPLANT
KIT BASIN OR (CUSTOM PROCEDURE TRAY) ×5 IMPLANT
KIT DILATOR VASC 18G NDL (KITS) IMPLANT
KIT HEART LEFT (KITS) ×5 IMPLANT
KIT SUCTION CATH 14FR (SUCTIONS) IMPLANT
KIT TURNOVER KIT B (KITS) ×5 IMPLANT
LOOP VESSEL MAXI BLUE (MISCELLANEOUS) IMPLANT
LOOP VESSEL MINI RED (MISCELLANEOUS) IMPLANT
NEEDLE 22X1 1/2 (OR ONLY) (NEEDLE) IMPLANT
NEEDLE PERC 18GX7CM (NEEDLE) ×5 IMPLANT
NS IRRIG 1000ML POUR BTL (IV SOLUTION) ×5 IMPLANT
PACK ENDOVASCULAR (PACKS) ×5 IMPLANT
PAD ARMBOARD 7.5X6 YLW CONV (MISCELLANEOUS) ×10 IMPLANT
PAD ELECT DEFIB RADIOL ZOLL (MISCELLANEOUS) ×5 IMPLANT
PENCIL BUTTON HOLSTER BLD 10FT (ELECTRODE) IMPLANT
PERCLOSE PROGLIDE 6F (VASCULAR PRODUCTS) ×10
SET MICROPUNCTURE 5F STIFF (MISCELLANEOUS) ×5 IMPLANT
SHEATH BRITE TIP 6FR 35CM (SHEATH) ×5 IMPLANT
SHEATH PINNACLE 6F 10CM (SHEATH) ×5 IMPLANT
SHEATH PINNACLE 8F 10CM (SHEATH) ×5 IMPLANT
SLEEVE REPOSITIONING LENGTH 30 (MISCELLANEOUS) ×5 IMPLANT
SPONGE GAUZE 2X2 STER 10/PKG (GAUZE/BANDAGES/DRESSINGS) ×2
SPONGE LAP 4X18 RFD (DISPOSABLE) IMPLANT
STOPCOCK MORSE 400PSI 3WAY (MISCELLANEOUS) ×10 IMPLANT
SUT ETHIBOND X763 2 0 SH 1 (SUTURE) IMPLANT
SUT GORETEX CV 4 TH 22 36 (SUTURE) IMPLANT
SUT GORETEX CV4 TH-18 (SUTURE) IMPLANT
SUT MNCRL AB 3-0 PS2 18 (SUTURE) IMPLANT
SUT PROLENE 5 0 C 1 36 (SUTURE) IMPLANT
SUT PROLENE 6 0 C 1 30 (SUTURE) IMPLANT
SUT SILK  1 MH (SUTURE) ×2
SUT SILK 1 MH (SUTURE) ×3 IMPLANT
SUT VIC AB 2-0 CT1 27 (SUTURE)
SUT VIC AB 2-0 CT1 TAPERPNT 27 (SUTURE) IMPLANT
SUT VIC AB 2-0 CTX 36 (SUTURE) IMPLANT
SUT VIC AB 3-0 SH 8-18 (SUTURE) IMPLANT
SYR 50ML LL SCALE MARK (SYRINGE) ×5 IMPLANT
SYR BULB IRRIGATION 50ML (SYRINGE) IMPLANT
SYR CONTROL 10ML LL (SYRINGE) IMPLANT
TOWEL GREEN STERILE (TOWEL DISPOSABLE) ×10 IMPLANT
TRANSDUCER W/STOPCOCK (MISCELLANEOUS) ×10 IMPLANT
TRAY FOLEY SLVR 16FR TEMP STAT (SET/KITS/TRAYS/PACK) IMPLANT
VALVE HEART TRANSCATH SZ3 26MM (Valve) ×5 IMPLANT
WIRE .035 3MM-J 145CM (WIRE) ×5 IMPLANT

## 2018-02-05 NOTE — Progress Notes (Signed)
Patient arrived from cath lab to 4e 05 patient with b groins with out hematoma or bleeding at this time. Patient placed on monitor, oriented to room and callbell and vital signs obtained. Will monitor patient. Calen Geister, Bettina Gavia RN

## 2018-02-05 NOTE — Transfer of Care (Signed)
Immediate Anesthesia Transfer of Care Note  Patient: Austin Lloyd  Procedure(s) Performed: TRANSCATHETER AORTIC VALVE REPLACEMENT, TRANSFEMORAL (N/A Chest) INTRAOPERATIVE TRANSTHORACIC ECHOCARDIOGRAM (Chest)  Patient Location: Cath Lab  Anesthesia Type:MAC  Level of Consciousness: awake and patient cooperative  Airway & Oxygen Therapy: Patient Spontanous Breathing and Patient connected to nasal cannula oxygen  Post-op Assessment: Report given to RN and Post -op Vital signs reviewed and stable  Post vital signs: Reviewed and stable  Last Vitals:  Vitals Value Taken Time  BP 119/66 02/05/2018  9:47 AM  Temp 36.3 C 02/05/2018  9:50 AM  Pulse 55 02/05/2018  9:52 AM  Resp 17 02/05/2018  9:52 AM  SpO2 94 % 02/05/2018  9:52 AM  Vitals shown include unvalidated device data.  Last Pain:  Vitals:   02/05/18 0950  TempSrc: Temporal  PainSc:          Complications: No apparent anesthesia complications

## 2018-02-05 NOTE — Anesthesia Procedure Notes (Signed)
Arterial Line Insertion Start/End11/19/2019 6:35 AM, 02/05/2018 7:45 AM Performed by: Lance Coon, CRNA, CRNA  Patient location: Pre-op. Preanesthetic checklist: patient identified, IV checked, site marked, risks and benefits discussed, surgical consent, monitors and equipment checked, pre-op evaluation, timeout performed and anesthesia consent Lidocaine 1% used for infiltration and patient sedated Left, radial was placed Catheter size: 20 G Hand hygiene performed , maximum sterile barriers used  and Seldinger technique used  Attempts: 1 Procedure performed without using ultrasound guided technique. Following insertion, dressing applied and Biopatch. Post procedure assessment: normal and unchanged  Patient tolerated the procedure well with no immediate complications.

## 2018-02-05 NOTE — Anesthesia Procedure Notes (Signed)
Central Venous Catheter Insertion Performed by: Roderic Palau, MD, anesthesiologist Start/End11/19/2019 6:30 AM, 02/05/2018 6:40 AM Patient location: Pre-op. Preanesthetic checklist: patient identified, IV checked, site marked, risks and benefits discussed, surgical consent, monitors and equipment checked, pre-op evaluation, timeout performed and anesthesia consent Position: Trendelenburg Lidocaine 1% used for infiltration and patient sedated Hand hygiene performed , maximum sterile barriers used  and Seldinger technique used Catheter size: 8 Fr Total catheter length 16. Central line was placed.Double lumen Procedure performed using ultrasound guided technique. Ultrasound Notes:anatomy identified, needle tip was noted to be adjacent to the nerve/plexus identified, no ultrasound evidence of intravascular and/or intraneural injection and image(s) printed for medical record Attempts: 1 Following insertion, dressing applied, line sutured and Biopatch. Post procedure assessment: blood return through all ports  Patient tolerated the procedure well with no immediate complications.

## 2018-02-05 NOTE — Progress Notes (Signed)
  Echocardiogram 2D Echocardiogram has been performed.  Austin Lloyd 02/05/2018, 9:44 AM

## 2018-02-05 NOTE — Anesthesia Procedure Notes (Signed)
Procedure Name: MAC Date/Time: 02/05/2018 7:40 AM Performed by: Lance Coon, CRNA Pre-anesthesia Checklist: Emergency Drugs available, Patient identified, Suction available, Patient being monitored and Timeout performed Patient Re-evaluated:Patient Re-evaluated prior to induction Oxygen Delivery Method: Simple face mask

## 2018-02-05 NOTE — H&P (Signed)
[address]       [place] [address]             [phone]      Cardiothoracic Surgery Admission History and Physical   Referring Provider is [name], MD  PCP is [name] [name], DO      Chief Complaint  Patient presents with  . Aortic Stenosis       HPI:  The patient is an 84 year old gentleman with a history of bladder cancer, chronic lymphocytic leukemia, prostate cancer, lung cancer, and coronary disease status post coronary bypass graft surgery x2 in 2016, diabetes, hypertension, and aortic stenosis. The patient lived in [place] prior to moving here about 3 months ago to live with his daughter. He was diagnosed with CLL in 2012 and was treated with chemotherapy. 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 The pathology showed stage Ib with visceral pleural invasion but negative margins and no lymph node involvement. Patient subsequently underwent coronary bypass graft surgery in 2016 with a LIMA to the LAD and a saphenous vein graft to the right coronary artery. He has reportedly had ischemic cardiomyopathy with low ejection fraction in the past. He was found to have a recurrence of lung cancer in February 2018 that was diagnosed by bronchoscopy. He underwent treatment with stereotactic body radiotherapy to the lesion. He then reportedly had a new right lower lobe nodule show up in 01/2017 that was initially concerning for recurrence but then resolved spontaneously. Since he moved to [facility] he has been seen by [name] with oncology and plans were made for a follow-up chest CT scan in early 2020. He had known aortic stenosis and so that he was told by his cardiologist in [place] that it appeared to be getting worse. He was seen by [name] in August 2019 and underwent a follow-up echocardiogram on 11/06/2017 which showed a  trileaflet severely thickened and calcified aortic valve with severely restricted leaflet mobility. The mean gradient was 26 mmHg and a peak gradient 48 mmHg. Dimensionless index was 0.2 with a valve area of 0.89 cm. Left ventricular ejection fraction was 40 to 45% with grade 2 diastolic dysfunction and left ventricular dilatation. He was referred to [name] for consideration of transcatheter aortic valve replacement.   The patient was seen in the office with his wife. He currently lives with his wife at his daughter's house in [place]. They note that over the past year he has had slowly progressive exertional fatigue and shortness of breath. He said that he does get short of breath walking around in his house and doing any kind of lifting or going up hills. His wife notes that he takes frequent rest breaks. He denies any dizziness or syncope. He denies any chest discomfort. He has some shortness of breath lying flat. He has had mild lower extremity edema. He has been eating well and maintaining his weight. He does report some cough this been nonproductive. No hemoptysis.       Past Medical History:  Diagnosis Date  . Aortic stenosis   . Bladder cancer ([facility])    T1 status post BCG  . 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 Coronary artery disease with history of myocardial infarction without history of CABG    LIMA to the LAD, SVG to RCA  2016 for treatment of the percent left main stenosis and 80% RCA stenosis.   . Depression   . Diabetes mellitus, type II ([facility])   . Hypertension   . Ischemic cardiomyopathy    EF 40% 2019  . Lung cancer ([facility])    Adenocarcinoma of the right lung moderately differentiated  . PVC's (premature ventricular contractions)         Past Surgical History:  Procedure Laterality Date  . CORONARY ARTERY BYPASS GRAFT    . INGUINAL HERNIA REPAIR    . LUNG REMOVAL, PARTIAL    . RIGHT/LEFT HEART  CATH AND CORONARY/GRAFT ANGIOGRAPHY N/A 12/21/2017   Procedure: RIGHT/LEFT HEART CATH AND CORONARY/GRAFT ANGIOGRAPHY; Surgeon: [name], MD; Location: [facility]; Service: Cardiovascular; Laterality: N/A;  . Ulcer surgery          Family History  Problem Relation Age of Onset  . AAA (abdominal aortic aneurysm) Mother   . Cancer Father    Brain tumor   Social History        Socioeconomic History  . Marital status: Married    Spouse name: Not on file  . Number of children: 3  . Years of education: Not on file  . Highest education level: Not on file  Occupational History  . 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 Financial resource strain: Not on file  . Food insecurity:    Worry: Not on file    Inability: Not on file  . Transportation needs:    Medical: Not on file    Non-medical: Not on file  Tobacco Use  . Smoking status: Former Smoker    Packs/day: 0.20    Years: 30.00    Pack years: 6.00    Types: Cigarettes  . Smokeless tobacco: Never Used  Substance and Sexual Activity  . Alcohol use: Yes    Alcohol/week: 2.0 standard drinks    Types: 2 Cans of beer per week    Comment: 2 cans beer per week.  . Drug use: Never  . Sexual activity: Not on file  Lifestyle  . Physical activity:    Days per week: Not on file    Minutes per session: Not on file  . Stress: Not on file  Relationships  . Social connections:    Talks on phone: Not on file    Gets together: Not on file    Attends religious service: Not on file    Active member of club or organization: Not on file    Attends meetings of clubs or organizations: Not on file    Relationship status: Not on file  . Intimate partner violence:    Fear of current or ex partner: Not on file    Emotionally abused: Not on file    Physically abused: Not on file    Forced sexual activity: Not on file  Other Topics Concern  . Not on file  Social History  Narrative   Lives with wife daughter. Three children and one grandchild.          Current Outpatient Medications  Medication Sig Dispense Refill  . aspirin EC 81 MG tablet Take 81 mg by mouth daily.    . furosemide (LASIX) 20 MG tablet Take 20 mg by mouth daily.    Marland Kitchen glucose blood (ACCU-CHEK ACTIVE STRIPS) test strip Use as directed to check blood sugar twice daily    . insulin detemir (LEVEMIR) 100 UNIT/ML injection  Inject 31 Units into the skin daily.     . Insulin Pen Needle 31G X 8 MM MISC by Does not apply route.    . iron polysaccharides (NIFEREX) 150 MG capsule Take 150 mg by mouth daily.    . Lancets MISC Use three times daily to check blood sugar.    . losartan (COZAAR) 25 MG tablet Take 25 mg by mouth daily.    Marland Kitchen lovastatin (MEVACOR) 40 MG tablet Take 40 mg by mouth at bedtime.    . Melatonin 10 MG TABS Take 5 mg by mouth at bedtime as needed (sleep).     . metoprolol succinate (TOPROL-XL) 25 MG 24 hr tablet Take 25 mg by mouth 2 (two) times daily.    . Multiple Vitamin (MULTIVITAMIN) tablet Take 1 tablet by mouth daily.    . tamsulosin (FLOMAX) 0.4 MG CAPS capsule Take 1 capsule (0.4 mg total) by mouth daily. 90 capsule 3   No current facility-administered medications for this visit.    No Known Allergies   Review of Systems:   General: normal appetite, decreased energy, no weight gain, no weight loss, no fever  Cardiac: no chest pain with exertion, no chest pain at rest, +SOB with exertion, no resting SOB, no PND, + orthopnea, no palpitations, no arrhythmia, no atrial fibrillation, + LE edema, no dizzy spells, no syncope  Respiratory: exertional shortness of breath, no home oxygen, no productive cough, + dry cough, no bronchitis, + wheezing, no hemoptysis, no asthma, no pain with inspiration or cough, no sleep apnea, no CPAP at night  GI: no difficulty swallowing, no reflux, no frequent heartburn, no hiatal hernia, no abdominal pain, no constipation, no diarrhea, no  hematochezia, no hematemesis, no melena  GU: no dysuria, no frequency, no urinary tract infection, no hematuria, no enlarged prostate, no kidney stones, no kidney disease  Vascular: + pain suggestive of claudication, no pain in feet, + leg cramps, no varicose veins, no DVT, no non-healing foot ulcer  Neuro: no stroke, no TIA's, no seizures, no headaches, no temporary blindness one eye, no slurred speech, no peripheral neuropathy, no chronic pain, no instability of gait, no memory/cognitive dysfunction  Musculoskeletal: no arthritis, no joint swelling, no myalgias, + difficulty walking, reduced mobility  Skin: no rash, no itching, no skin infections, no pressure sores or ulcerations  Psych: no anxiety, no depression, no nervousness, no unusual recent stress  Eyes: no blurry vision, no floaters, no recent vision changes, + wears glasses  ENT: + hearing loss, no loose or painful teeth, full dentures  Hematologic: no easy bruising, no abnormal bleeding, no clotting disorder, no frequent epistaxis  Endocrine: + diabetes, does check CBG's at home    Physical Exam:   BP 124/86  Pulse 72  Resp 20  Ht 5\' 4"  (1.626 m)  Wt 150 lb (68 kg)  SpO2 94% Comment: RA  BMI 25.75 kg/m  General: Elderly but well-appearing  HEENT: Unremarkable, NCAT, PERLA, EOMI, oropharynx clear  Neck: no JVD, no bruits, no adenopathy or thyromegaly  Chest: clear to auscultation, symmetrical breath sounds, no wheezes, no rhonchi, old right thoracotomy scar.  CV: RRR, grade III/VI crescendo/decrescendo murmur heard best at RSB, no diastolic murmur, old sternotomy scar.  Abdomen: soft, non-tender, no masses or organomegaly  Extremities: warm, well-perfused, pulses diminished in feet, no LE edema, bilateral vein harvest incisions.  Rectal/GU Deferred  Neuro: Grossly non-focal and symmetrical throughout  Skin: Clean and dry, no rashes, no breakdown    Diagnostic Tests:   *  [facility]  [address] [place],  [address]  [phone]  -------------------------------------------------------------------  Transthoracic Echocardiography  Patient: Henry, Utsey  MR #: [id number]  Study Date: 11/06/2017  Gender: M  Age: 63  Height: 162.6 cm  Weight: 69.4 kg  BSA: 1.79 m^2  Pt. Status:  Room:  ORDERING [name], MD  REFERRING [name], MD  SONOGRAPHER [place], Gervais Croitoru, MD  PERFORMING [facility], Outpatient  cc:  -------------------------------------------------------------------  LV EF: 40% - 45%  -------------------------------------------------------------------  Indications: (I35.0).  -------------------------------------------------------------------  History: PMH: Acquired from the patient and from the patient&'s  chart.  -------------------------------------------------------------------  Study Conclusions  - Left ventricle: The cavity size was mildly dilated. Wall  thickness was normal. Systolic function was mildly to moderately  reduced. The estimated ejection fraction was in the range of 40%  to 45%. Severe hypokinesis of the basal-midinferolateral,  inferior, and inferoseptal myocardium; consistent with infarction  in the distribution of the right coronary artery. Features are  consistent with a pseudonormal left ventricular filling pattern,  with concomitant abnormal relaxation and increased filling  pressure (grade 2 diastolic dysfunction).  - Aortic valve: Right coronary cusp mobility was severely  restricted. Transvalvular velocity was increased less than  expected, due to low cardiac output. There was moderate to severe  stenosis. The aortic jet remains early peaking, suggesting  non-critical stenosis. There was mild regurgitation.  - Mitral valve: Calcified annulus. There was moderate regurgitation  directed eccentrically and posteriorly.  - Left atrium: The atrium was moderately dilated.  - Right ventricle: The cavity  size was moderately dilated. Systolic  function was moderately reduced.  - Right atrium: The atrium was moderately dilated.  -------------------------------------------------------------------  Study data: No prior study was available for comparison. Study  status: Routine. Procedure: The patient reported no pain pre or  post test. Transthoracic echocardiography for left ventricular  function evaluation and for assessment of valvular function. Image  quality was adequate. Study completion: There were no  complications. Transthoracic echocardiography. M-mode,  complete 2D, spectral Doppler, and color Doppler. Birthdate:  Patient birthdate: 05-01-1929. Age: Patient is 84 yr old. Sex:  Gender: male. BMI: 26.2 kg/m^2. Blood pressure: 115/64  Patient status: Outpatient. Study date: Study date: 11/06/2017.  Study time: 08:37 AM. Location: [facility]  -------------------------------------------------------------------  -------------------------------------------------------------------  Left ventricle: The cavity size was mildly dilated. Wall thickness  was normal. Systolic function was mildly to moderately reduced. The  estimated ejection fraction was in the range of 40% to 45%.  Regional wall motion abnormalities: Severe hypokinesis of the  basal-midinferolateral, inferior, and inferoseptal myocardium;  consistent with infarction in the distribution of the right  coronary artery. Features are consistent with a pseudonormal left  ventricular filling pattern, with concomitant abnormal relaxation  and increased filling pressure (grade 2 diastolic dysfunction).  -------------------------------------------------------------------  Aortic valve: Trileaflet; severely thickened, moderately  calcified leaflets. Right coronary cusp mobility was severely  restricted. Doppler: Transvalvular velocity was increased less  than expected, due to low cardiac output. There was moderate to  severe  stenosis. There was mild regurgitation. VTI ratio of LVOT  to aortic valve: 0.2. Valve area (VTI): 0.93 cm^2. Indexed valve  area (VTI): 0.52 cm^2/m^2. Peak velocity ratio of LVOT to aortic  valve: 0.2. Valve area (Vmax): 0.93 cm^2. Indexed valve area  (Vmax): 0.52 cm^2/m^2. Mean velocity ratio of LVOT to aortic valve:  0.19. Valve area (Vmean): 0.89 cm^2. Indexed valve area (Vmean):  0.5 cm^2/m^2.  Mean gradient (S): 26 mm Hg. Peak gradient (S): 48  mm Hg.  -------------------------------------------------------------------  Aorta: Aortic root: The aortic root was normal in size.  Ascending aorta: The ascending aorta was normal in size.  -------------------------------------------------------------------  Mitral valve: Calcified annulus. Leaflet separation was normal.  Doppler: Transvalvular velocity was within the normal range. There  was no evidence for stenosis. There was moderate regurgitation  directed eccentrically and posteriorly. Valve area by pressure  half-time: 3.86 cm^2. Indexed valve area by pressure half-time:  2.16 cm^2/m^2. Peak gradient (D): 6 mm Hg.  -------------------------------------------------------------------  Left atrium: The atrium was moderately dilated.  -------------------------------------------------------------------  Right ventricle: The cavity size was moderately dilated. Systolic  function was moderately reduced.  -------------------------------------------------------------------  Pulmonic valve: Poorly visualized. The valve appears to be  grossly normal. Doppler: Transvalvular velocity was within the  normal range. There was no evidence for stenosis. There was no  significant regurgitation.  -------------------------------------------------------------------  Tricuspid valve: Structurally normal valve. Leaflet separation  was normal. Doppler: Transvalvular velocity was within the normal  range. There was no regurgitation.    -------------------------------------------------------------------  Pulmonary artery: Systolic pressure could not be accurately  estimated.  -------------------------------------------------------------------  Right atrium: The atrium was moderately dilated.  -------------------------------------------------------------------  Pericardium: There was no pericardial effusion.  -------------------------------------------------------------------  Systemic veins:  Inferior vena cava: The vessel was normal in size. The  respirophasic diameter changes were in the normal range (>= 50%),  consistent with normal central venous pressure.  -------------------------------------------------------------------  Measurements  Left ventricle Value Reference  LV ID, ED, PLAX chordal (H) 59 mm 43 - 52  LV ID, ES, PLAX chordal (H) 52 mm 23 - 38  LV fx shortening, PLAX chordal (L) 12 % >=29  LV PW thickness, ED 10 mm ----------  IVS/LV PW ratio, ED 1.1 <=1.3  Stroke volume, 2D 69 ml ----------  Stroke volume/bsa, 2D 39 ml/m^2 ----------  Ventricular septum Value Reference  IVS thickness, ED 11 mm ----------  LVOT Value Reference  LVOT ID, S 24.3 mm ----------  LVOT area 4.64 cm^2 ----------  LVOT ID 24 mm ----------  LVOT peak velocity, S 68.43 cm/s ----------  LVOT mean velocity, S 45.9 cm/s ----------  LVOT VTI, S 15.79 cm ----------  Stroke volume (SV), LVOT DP 73.2 ml ----------  Stroke index (SV/bsa), LVOT DP 41 ml/m^2 ----------  Aortic valve Value Reference  Aortic valve peak velocity, S 347 cm/s ----------  Aortic valve mean velocity, S 240 cm/s ----------  Aortic valve VTI, S 80.2 cm ----------  Aortic mean gradient, S 26 mm Hg ----------  Aortic peak gradient, S 48 mm Hg ----------  VTI ratio, LVOT/AV 0.2 ----------  Aortic valve area, VTI 0.93 cm^2 ----------  Aortic valve area/bsa, VTI 0.52 cm^2/m^2 ----------  Velocity ratio, peak, LVOT/AV 0.2 ----------  Aortic valve area, peak  velocity 0.93 cm^2 ----------  Aortic valve area/bsa, peak 0.52 cm^2/m^2 ----------  velocity  Velocity ratio, mean, LVOT/AV 0.19 ----------  Aortic valve area, mean velocity 0.89 cm^2 ----------  Aortic valve area/bsa, mean 0.5 cm^2/m^2 ----------  velocity  Aortic regurg pressure half-time 510 ms ----------  Aorta Value Reference  Aortic root ID, ED 33 mm ----------  Ascending aorta ID, A-P, S 37 mm ----------  Left atrium Value Reference  LA ID, A-P, ES 46 mm ----------  LA ID/bsa, A-P (H) 2.58 cm/m^2 <=2.2  LA volume, S 67.6 ml ----------  LA volume/bsa, S 37.9 ml/m^2 ----------  LA volume, ES, 1-p A4C 57.9 ml ----------  LA volume/bsa,  ES, 1-p A4C 32.4 ml/m^2 ----------  LA volume, ES, 1-p A2C 69.7 ml ----------  LA volume/bsa, ES, 1-p A2C 39 ml/m^2 ----------  Mitral valve Value Reference  Mitral E-wave peak velocity 127 cm/s ----------  Mitral A-wave peak velocity 72.7 cm/s ----------  Mitral deceleration time 185 ms 150 - 230  Mitral pressure half-time 54 ms ----------  Mitral peak gradient, D 6 mm Hg ----------  Mitral E/A ratio, peak 2 ----------  Mitral valve area, PHT, DP 3.86 cm^2 ----------  Mitral valve area/bsa, PHT, DP 2.16 cm^2/m^2 ----------  Right atrium Value Reference  RA ID, S-I, ES, A4C (H) 58.7 mm 34 - 49  RA area, ES, A4C (H) 20.4 cm^2 8.3 - 19.5  RA volume, ES, A/L 60.1 ml ----------  RA volume/bsa, ES, A/L 33.7 ml/m^2 ----------  Systemic veins Value Reference  Estimated CVP 3 mm Hg ----------  Right ventricle Value Reference  RV ID, minor axis, ED, A4C (H) 47.86 mm 26 - 43  Legend:  (L) and (H) mark values outside specified reference range.  -------------------------------------------------------------------  Prepared and Electronically Authenticated by  Sanda Klein, MD  2019-08-20T13:30:08    Physicians  Panel Physicians Referring Physician Case Authorizing Physician  [name], MD (Primary)    Procedures  RIGHT/LEFT  HEART CATH AND CORONARY/GRAFT ANGIOGRAPHY  Conclusion  Dist RCA lesion is 99% stenosed.  Mid RCA lesion is 70% stenosed.  SVG graft was visualized by angiography and is normal in caliber.  Ost LM to Mid LM lesion is 80% stenosed.  LIMA graft was visualized by angiography and is normal in caliber.  Prox Cx to Dist Cx lesion is 20% stenosed.  Prox LAD lesion is 20% stenosed.  Hemodynamic findings consistent with moderate pulmonary hypertension. 1. Severe ostial left main stenosis  2. The LAD has mild non-obstructive disease  3. The Circumflex has mild non-obstructive disease  4. The LIMA to the LAD is patent.  5. The RCA has severe distal disease. The SVG to the PDA is patent.  6. Severe aortic stenosis (peak to peak gradient 17 mmHg, mean gradient 14.2 mmHg by cath). By echo the AVA and dimensionless index support severe aortic stenosis. The valve is thickened and calcified by echo and appears severely stenotic.  7. Elevated right sided pressures  8. Elevated LVEDP.  Recommendations: Continue workup for TAVR. I will ask him to double his Lasix over the next three days.   Indications  Severe aortic stenosis [I35.0 (ICD-10-CM)]  Procedural Details/Technique  Technical Details Indication: CAD s/p 2vCABG, severe AS, planning for TAVR  Procedure: The risks, benefits, complications, treatment options, and expected outcomes were discussed with the patient. The patient and/or family concurred with the proposed plan, giving informed consent. The patient was brought to the cath lab after IV hydration was given. The patient was sedated with Versed and Fentanyl. There was an IV catheter present in the right antecubital vein. This area was prepped and draped. The catheter was changed for a 5 French sheath. Right heart catheterization performed with a balloon tipped catheter. The left wrist was prepped and draped in a sterile fashion. 1% lidocaine was used for local anesthesia. Using the modified Seldinger  access technique, a 5 French sheath was placed in the left radial artery. 3 mg Verapamil was given through the sheath. 3500 units IV heparin was given. Standard diagnostic catheters were used to perform selective coronary angiography. I crossed the aortic valve with the J wire and JL3.5 catheter. LV pressures measured. The sheath  was removed from the left radial artery and a Terumo hemostasis band was applied at the arteriotomy site on the left wrist.     Estimated blood loss <50 mL.  During this procedure the patient was administered the following to achieve and maintain moderate conscious sedation: Versed 2 mg, Fentanyl 50 mcg, while the patient's heart rate, blood pressure, and oxygen saturation were continuously monitored. The period of conscious sedation was 30 minutes, of which I was present face-to-face 100% of this time.  Complications  Complications documented before study signed (12/21/2017 1:19 PM EDT)   RIGHT/LEFT HEART CATH AND CORONARY/GRAFT ANGIOGRAPHY   None Documented by [name], MD 12/21/2017 12:53 PM EDT  Time Range: Intraprocedure    Coronary Findings  Diagnostic  Dominance: Right  Left Main  Ost LM to Mid LM lesion 80% stenosed  Ost LM to Mid LM lesion is 80% stenosed.  Left Anterior Descending  Vessel is large.  Prox LAD lesion 20% stenosed  Prox LAD lesion is 20% stenosed.  Left Circumflex  Prox Cx to Dist Cx lesion 20% stenosed  Prox Cx to Dist Cx lesion is 20% stenosed.  First Obtuse Marginal Branch  Vessel is small in size.  Second Obtuse Marginal Branch  Vessel is moderate in size.  Third Obtuse Marginal Branch  Vessel is small in size.  Right Coronary Artery  Mid RCA lesion 70% stenosed  Mid RCA lesion is 70% stenosed.  Dist RCA lesion 99% stenosed  Dist RCA lesion is 99% stenosed.  saphenous Graft to Dist RCA  SVG graft was visualized by angiography and is normal in caliber.  LIMA LIMA Graft to Mid LAD  LIMA graft was visualized by  angiography and is normal in caliber.  Intervention  No interventions have been documented.  Right Heart  Right Heart Pressures Hemodynamic findings consistent with moderate pulmonary hypertension. Elevated LV EDP consistent with volume overload.  Coronary Diagrams  Diagnostic Diagram     Implants     No implant documentation for this case.  MERGE Images  Link to Procedure Log   Show images for CARDIAC CATHETERIZATION Procedure Log  Hemo Data   Most Recent Value  Fick Cardiac Output 4.33 L/min  Fick Cardiac Output Index 2.49 (L/min)/BSA  Aortic Mean Gradient 14.2 mmHg  Aortic Peak Gradient 17 mmHg  Aortic Valve Area 1.86  Aortic Value Area Index 1.07 cm2/BSA  RA A Wave 10 mmHg  RA V Wave 7 mmHg  RA Mean 7 mmHg  RV Systolic Pressure 81 mmHg  RV Diastolic Pressure 5 mmHg  RV EDP 10 mmHg  PA Systolic Pressure 73 mmHg  PA Diastolic Pressure 31 mmHg  PA Mean 45 mmHg  PW A Wave 25 mmHg  PW V Wave 29 mmHg  PW Mean 25 mmHg  AO Systolic Pressure 275 mmHg  AO Diastolic Pressure 65 mmHg  AO Mean 89 mmHg  LV Systolic Pressure 170 mmHg  LV Diastolic Pressure 12 mmHg  LV EDP 18 mmHg  AOp Systolic Pressure 017 mmHg  AOp Diastolic Pressure 72 mmHg  AOp Mean Pressure 96 mmHg  LVp Systolic Pressure 494 mmHg  LVp Diastolic Pressure 9 mmHg  LVp EDP Pressure 18 mmHg  QP/QS 1  TPVR Index 18.1 HRUI  TSVR Index 35.79 HRUI  PVR SVR Ratio 0.24  TPVR/TSVR Ratio 0.51    CLINICAL DATA: 84 year old male with history of severe aortic  stenosis. Preprocedural study prior to potential transcatheter  aortic valve replacement (TAVR) procedure.  EXAM:  CT  ANGIOGRAPHY CHEST, ABDOMEN AND PELVIS  TECHNIQUE:  Multidetector CT imaging through the chest, abdomen and pelvis was  performed using the standard protocol during bolus administration of  intravenous contrast. Multiplanar reconstructed images and MIPs were  obtained and reviewed to evaluate the vascular anatomy.  CONTRAST: 173mL  ISOVUE-370 IOPAMIDOL (ISOVUE-370) INJECTION 76%  COMPARISON: No priors.  FINDINGS:  CTA CHEST FINDINGS  Cardiovascular: Heart size is enlarged with left ventricular  dilatation. There is no significant pericardial fluid, thickening or  pericardial calcification. There is aortic atherosclerosis, as well  as atherosclerosis of the great vessels of the mediastinum and the  coronary arteries, including calcified atherosclerotic plaque in the  left main, left anterior descending, left circumflex and right  coronary arteries. Status post median sternotomy for CABG including  LIMA to the LAD. Thickening calcification of the aortic valve.  Mediastinum/Lymph Nodes: No pathologically enlarged mediastinal or  hilar lymph nodes. Esophagus is unremarkable in appearance. No  axillary lymphadenopathy.  Lungs/Pleura: In the central aspect of the right upper lobe there is  a perihilar mass measuring 5.2 x 2.6 x 3.2 cm (axial image 40 of  series 14 and coronal image 84 of series 16) which is macrolobulated  with spiculated margins and retraction of adjacent structures,  including spiculations extending to the overlying pleura which is  thickened. Internal areas of potential calcification, and likely  multiple internal collateral vessels. This abuts the hilum where  there are nonenlarged lymph nodes. Small right pleural effusion  lying dependently, likely malignant. A few other scattered tiny  pulmonary nodules are noted in the lungs, nonspecific. No acute  consolidative airspace disease. Trace left pleural effusion lying  dependently.  Musculoskeletal/Soft Tissues: Median sternotomy. There are no  aggressive appearing lytic or blastic lesions noted in the  visualized portions of the skeleton.  CTA ABDOMEN AND PELVIS FINDINGS  Hepatobiliary: No suspicious cystic or solid hepatic lesions. No  intra or extrahepatic biliary ductal dilatation. 2 cm calcified  gallstone in the gallbladder. No findings to  suggest an acute  cholecystitis at this time.  Pancreas: No pancreatic mass. No pancreatic ductal dilatation. No  pancreatic or peripancreatic fluid or inflammatory changes.  Spleen: Unremarkable.  Adrenals/Urinary Tract: 2.6 cm low-attenuation lesion in the lower  pole the left kidney is compatible with a simple cyst. No suspicious  renal lesions. Multifocal cortical thinning in the kidneys  bilaterally, most compatible with mild scarring. Bilateral adrenal  glands are normal in appearance. No hydroureteronephrosis. Urinary  bladder is normal in appearance.  Stomach/Bowel: Postoperative changes of gastrojejunostomy. No  pathologic dilatation of small bowel or colon. Numerous colonic  diverticulae are noted, without surrounding inflammatory changes to  suggest an acute diverticulitis at this time. Normal appendix.  Vascular/Lymphatic: Aortic atherosclerosis, without evidence of  aneurysm or dissection in the abdominal or pelvic vasculature.  Vascular findings and measurements pertinent to potential TAVR  procedure, as detailed below. Retroaortic left renal vein (normal  anatomical variant) incidentally noted. No lymphadenopathy noted in  the abdomen or pelvis.  Reproductive: Prostate gland and seminal vesicles are unremarkable  in appearance.  Other: No significant volume of ascites. No pneumoperitoneum.  Musculoskeletal: There are no aggressive appearing lytic or blastic  lesions noted in the visualized portions of the skeleton.  VASCULAR MEASUREMENTS PERTINENT TO TAVR:  AORTA:  Minimal Aortic Diameter-15 x 14 mm  Severity of Aortic Calcification-severe  RIGHT PELVIS:  Right Common Iliac Artery -  Minimal Diameter-7.4 x 6.1 mm  Tortuosity-mild  Calcification-moderate to severe  Right External Iliac Artery -  Minimal Diameter-5.1 x 4.3 mm  Tortuosity-moderate  Calcification-mild-to-moderate  Right Common Femoral Artery -  Minimal Diameter-4.8 x 4.9 mm  Tortuosity-mild    Calcification-moderate  LEFT PELVIS:  Left Common Iliac Artery -  Minimal Diameter-8.0 x 5.0 mm  Tortuosity-mild  Calcification-moderate to severe  Left External Iliac Artery -  Minimal Diameter-6.4 x 4.8 mm  Tortuosity-mild  Calcification-mild-to-moderate  Left Common Femoral Artery -  Minimal Diameter-7.1 x 2.9 mm  Tortuosity-mild  Calcification-moderate  Review of the MIP images confirms the above findings.  IMPRESSION:  1. Vascular findings and measurements pertinent to potential TAVR  procedure, as detailed above.  2. Thickening calcification of the aortic valve, compatible with the  reported clinical history of aortic stenosis.  3. Aggressive appearing perihilar mass in the right upper lobe  measuring 5.2 x 2.6 x 3.2 cm, highly concerning for primary  bronchogenic carcinoma. Further evaluation with PET-CT is  recommended in the near future for staging purposes.  4. Cholelithiasis without evidence of acute cholecystitis at this  time.  5. Colonic diverticulosis without evidence of acute diverticulitis  at this time.  6. Aortic atherosclerosis, in addition to left main and 3 vessel  coronary artery disease. Status post median sternotomy for CABG  including LIMA to the LAD.  7. Additional incidental findings, as above.  These results will be called to the ordering clinician or  representative by the Radiologist Assistant, and communication  documented in the PACS or zVision Dashboard.  Electronically Signed  By: Vinnie Langton M.D.  On: 01/09/2018 16:47    ADDENDUM REPORT: 01/10/2018 08:37  CLINICAL DATA: 84 year old male with known CAD, s/p CABG and severe  aortic stenosis being evaluated for a TAVR procedure.  EXAM:  Cardiac TAVR CT  TECHNIQUE:  The patient was scanned on a [employer]. A 120 kV  retrospective scan was triggered in the descending thoracic aorta at  111 HU's. Gantry rotation speed was 250 msecs and collimation was .6  mm. No beta  blockade or nitro were given. The 3D data set was  reconstructed in 5% intervals of the R-R cycle. Systolic and  diastolic phases were analyzed on a dedicated work station using  MPR, MIP and VRT modes. The patient received 80 cc of contrast.  FINDINGS:  Aortic Valve: Trileaflet aortic valve with severely thickened but  only mildly calcified leaflets with severely restricted leaflet  opening. There are no calcifications extending into the LVOT.  Aorta: Normal size with mild diffuse atherosclerotic plaque and  calcifications, no dissection.  Sinotubular Junction: 29 x 29 mm  Ascending Thoracic Aorta: 34 x 34 mm  Aortic Arch: 28 x 25 mm  Descending Thoracic Aorta: 26 x 25 mm  Sinus of Valsalva Measurements:  Non-coronary: 36 mm  Right -coronary: 35 mm  Left -coronary: 36 mm  Coronary Artery Height above Annulus:  Left Main: 14.7 mm  Right Coronary: 20.6 mm  Virtual Basal Annulus Measurements:  Maximum/Minimum Diameter: 29.0 x 20.0 mm  Mean Diameter: 24.7 mm  Perimeter: 90.0 mm  Area: 480 mm2  Optimum Fluoroscopic Angle for Delivery: LAO 10 CAU 12  IMPRESSION:  1. Trileaflet aortic valve with severely thickened but only mildly  calcified leaflets with severely restricted leaflet opening. There  are no calcifications extending into the LVOT. Annular measurements  suitable for delivery of a 26 mm Edwards-SAPIEN 3 valve.  2. Sufficient coronary to annulus distance.  3. Optimum Fluoroscopic Angle for Delivery: LAO 10 CAU 12.  4. No thrombus in the left atrial appendage.  5. Dilated pulmonary artery measuring 39 mm.  Electronically Signed  By: Ena Dawley  On: 01/10/2018 08:37   Addended by Dorothy Spark, MD on 01/10/2018 8:39 AM  Study Result   EXAM:  OVER-READ INTERPRETATION CT CHEST  The following report is an over-read performed by radiologist Dr.  Vinnie Langton of [facility], [place] on 01/09/2018. This  over-read does not include interpretation of cardiac or  coronary  anatomy or pathology. The coronary CTA interpretation by the  cardiologist is attached.  COMPARISON: None.  FINDINGS:  Extracardiac findings will be described separately under dictation  for contemporaneously obtained CTA chest, abdomen and pelvis.  IMPRESSION:  Please see separate dictation for contemporaneously obtained CTA of  the chest, abdomen and pelvis dated 01/09/2018 for full description  of relevant extracardiac findings.  Electronically Signed:  By: Vinnie Langton M.D.  On: 01/09/2018 15:39      CLINICAL DATA:  Initial treatment strategy for pulmonary nodule. Incidental finding. Additional history of bladder cancer and prostate cancer.  EXAM: NUCLEAR MEDICINE PET SKULL BASE TO THIGH  TECHNIQUE: 6.8 mCi F-18 FDG was injected intravenously. Full-ring PET imaging was performed from the skull base to thigh after the radiotracer. CT data was obtained and used for attenuation correction and anatomic localization.  Fasting blood glucose: 67 mg/dl  COMPARISON:  CT 1023 19  FINDINGS: Mediastinal blood pool activity: SUV max 1.6 cm  NECK: No hypermetabolic lymph nodes in the neck.  Incidental CT findings: none  CHEST: RIGHT perihilar angular consolidation associated with surgical clips and calcifications measures approximately 4.0 by 3.4 cm (image 71/4). This angular mass has mild to moderate metabolic activity along the lateral portion of the lesion where the calcifications/vascular clips are present with SUV max equal 4.1. The more medial portion lesion does not have significant metabolic activity.  There is metabolic activity associated with the anterior aorta arch which is benign.  Benign activity within the musculature adjacent to the scapula with SUV max equal 4.5  No hypermetabolic mediastinal lymph nodes.  There is a small layering RIGHT effusion is similar volume to comparison exam  Incidental CT findings:  none  ABDOMEN/PELVIS: There is bilateral metabolic activity associated adrenal glands with SUV max equal 3.6 and 4.4. This is greater than liver activity (SUV max equal 2.6). There is no lesion associated with the adrenal glands.  Incidental CT findings: Large hydrocele in the RIGHT hemiscrotum  SKELETON: No focal hypermetabolic activity to suggest skeletal metastasis.  Incidental CT findings: none  IMPRESSION: 1. Moderate metabolic activity along the lateral margin of the RIGHT perihilar mass is less than would be expected for bronchogenic carcinoma of this size. Favor postsurgical inflammatory activity. Cannot exclude recurrence at the lateral margin adjacent to the clip/calcifications. Recommend follow-up CT with contrast for future surveillance. No comparison available at this time 2. Bilateral hypermetabolic adrenal glands without nodular lesion is favored benign hyperplasia. 3. Small RIGHT effusion similar to prior. 4. Large RIGHT hydrocele of the RIGHT hemiscrotum   Electronically Signed   By: Suzy Bouchard M.D.   On: 01/18/2018 16:40    Impression:   This 84 year old gentleman has stage D, severe, symptomatic low gradient aortic stenosis with New York Heart Association class III symptoms of exertional fatigue and shortness of breath consistent with chronic diastolic congestive heart failure. I have personally reviewed his 2D echocardiogram, cardiac catheterization, and CTA studies. His echocardiogram shows a trileaflet aortic valve with severe thickening and moderate  calcification of the aortic valve leaflets with severely restricted mobility and looks like a severely stenotic valve despite only having a mean gradient of 26 mmHg. Cardiac catheterization shows patent bypass grafts and otherwise nonobstructive disease. His mean gradient was only 14.2 mmHg although again by echocardiogram his valve looks severely stenotic. Left ventricular end-diastolic pressure was  elevated at 18 mmHg and he had moderate to severe pulmonary hypertension. Ideally transcatheter aortic valve replacement would be the best treatment for his aortic stenosis. Unfortunately his CTA of the chest done as part of his work-up showed a 5.2 x 2.6 x 3.2 cm right perihilar mass that was concerning for a recurrent lung cancer given his prior history of right upper lobectomy for lung cancer and subsequent recurrence a year ago treated with radiation therapy. He also has a small right pleural effusion which could be malignant. There is no significant lymphadenopathy within the mediastinum and no signs of solid organ or bone involvement. A PET scan was done which showed moderate metabolic activity only along the lateral margin of this perihilar mass that was much less than expected for a recurrent bronchogenic carcinoma. We discussed his case and reviewed the films at the multidisciplinary thoracic oncology meeting and it was felt that this may be inflammatory due to his prior history of XRT to this region. Given the severity of his aortic stenosis it was felt by Dr. Julien Nordmann that we should proceed with TAVR and plan to followup the hilar mass with a repeat CT in a few months.  The patient and his wife were counseled at length regarding treatment alternatives for management of severe symptomatic aortic stenosis. The risks and benefits of surgical intervention has been discussed in detail. Long-term prognosis with medical therapy was discussed. Alternative approaches such as conventional surgical aortic valve replacement, transcatheter aortic valve replacement, and palliative medical therapy were compared and contrasted at length. This discussion was placed in the context of the patient's own specific clinical presentation and past medical history. All of their questions have been addressed.   Following the decision to proceed with transcatheter aortic valve replacement, a discussion was held regarding what  types of management strategies would be attempted intraoperatively in the event of life-threatening complications, including whether or not the patient would be considered a candidate for the use of cardiopulmonary bypass and/or conversion to open sternotomy for attempted surgical intervention. I don't think he is a candidate for open sternotomy given his age of 81, prior CABG and hx of recurrent lung cancer s/p XRT.   The patient has been advised of a variety of complications that might develop including but not limited to risks of death, stroke, paravalvular leak, aortic dissection or other major vascular complications, aortic annulus rupture, device embolization, cardiac rupture or perforation, mitral regurgitation, acute myocardial infarction, arrhythmia, heart block or bradycardia requiring permanent pacemaker placement, congestive heart failure, respiratory failure, renal failure, pneumonia, infection, other late complications related to structural valve deterioration or migration, or other complications that might ultimately cause a temporary or permanent loss of functional independence or other long term morbidity. The patient provides full informed consent for the procedure as described and all questions were answered.    Plan:   Transfemoral transcatheter aortic valve replacement.

## 2018-02-05 NOTE — Discharge Instructions (Signed)

## 2018-02-05 NOTE — CV Procedure (Signed)
[facility]  TAVR OPERATIVE NOTE   Date of Procedure:  02/05/2018  Preoperative Diagnosis: Severe Aortic Stenosis   Postoperative Diagnosis: Same   Procedure:    Transcatheter Aortic Valve Replacement - Transfemoral Approach  Edwards Sapien 3 THV (size 26 mm, model # [id number], serial # [id number])   Co-Surgeons:  Lauree Chandler, MD and Gaye Pollack, MD   Anesthesiologist:  Ermalene Postin  Echocardiographer:  Johnsie Cancel  Pre-operative Echo Findings:  Severe aortic stenosis  Mild left ventricular systolic dysfunction  Post-operative Echo Findings:  No paravalvular leak  Mild left ventricular systolic dysfunction  BRIEF CLINICAL NOTE AND INDICATIONS FOR SURGERY  84 yo male with history of bladder cancer, lung cancer, prostate cancer, chronic lymphocytic leukemia, CAD s/p 2V CABG in 2016, diabetes, HTN, ischemic cardiomyopathy, PVCs and severe aortic stenosis who is here today for TAVR. He has been followed in our office by [name] since August of 2019 when he moved to our area to be near his daughter. He is known to have aortic stenosis. Most recent echo 11/06/17 with LVEF=40-45% with severe hypokinesis of the inferior, basal mid-inferolateral walls. There was grade 2 diastolic dysfunction. The aortic valve leaflets are thickened and calcified with restricted motion. Mean gradient 26 mmHg. Dimensional index 0.2. AVA 0.89cm2. There was moderate mitral regurgitation. He has CAD and had CABG in 2016 (LIMA to LAD, SVG to RCA). Records state that his LVEF had been as low as 15%. He had chemotherapy for his CLL. His lung cancer was treated with lobectomy and radiation. His prostate cancer has not been treated. He describes dyspnea with exertion and fatigue. Cardiac cath with patent bypass grafts.   During the course of the patient's preoperative work up they have been evaluated comprehensively by a multidisciplinary team of specialists coordinated through the  [facility] in the [facility].  They have been demonstrated to suffer from symptomatic severe aortic stenosis as noted above. The patient has been counseled extensively as to the relative risks and benefits of all options for the treatment of severe aortic stenosis including long term medical therapy, conventional surgery for aortic valve replacement, and transcatheter aortic valve replacement.  The patient has been independently evaluated by Dr. Cyndia Bent with CT surgery and he is felt to be at high risk for conventional surgical aortic valve replacement. Dr. Cyndia Bent ndicated the patient would be a poor candidate for conventional surgery. Based upon review of all of the patient's preoperative diagnostic tests they are felt to be candidate for transcatheter aortic valve replacement using the transfemoral approach as an alternative to high risk conventional surgery.    Following the decision to proceed with transcatheter aortic valve replacement, a discussion has been held regarding what types of management strategies would be attempted intraoperatively in the event of life-threatening complications, including whether or not the patient would be considered a candidate for the use of cardiopulmonary bypass and/or conversion to open sternotomy for attempted surgical intervention.  The patient has been advised of a variety of complications that might develop peculiar to this approach including but not limited to risks of death, stroke, paravalvular leak, aortic dissection or other major vascular complications, aortic annulus rupture, device embolization, cardiac rupture or perforation, acute myocardial infarction, arrhythmia, heart block or bradycardia requiring permanent pacemaker placement, congestive heart failure, respiratory failure, renal failure, pneumonia, infection, other late complications related to structural valve deterioration or migration, or other  complications that might ultimately cause a temporary or permanent  loss of functional independence or other long term morbidity.  The patient provides full informed consent for the procedure as described and all questions were answered preoperatively.    DETAILS OF THE OPERATIVE PROCEDURE  PREPARATION:   The patient is brought to the operating room on the above mentioned date and central monitoring was established by the anesthesia team including placement of a radial arterial line. The patient is placed in the supine position on the operating table.  Intravenous antibiotics are administered. Conscious sedation is used.   Baseline transthoracic echocardiogram was performed. The patient's chest, abdomen, both groins, and both lower extremities are prepared and draped in a sterile manner. A time out procedure is performed.   PERIPHERAL ACCESS:   Using the modified Seldinger technique, femoral arterial and venous access were obtained with placement of 6 Fr sheaths on the right side using u/s guidance.  A pigtail diagnostic catheter was passed through the femoral arterial sheath under fluoroscopic guidance into the aortic root.  A temporary transvenous pacemaker catheter was passed through the femoral venous sheath under fluoroscopic guidance into the right ventricle.  The pacemaker was tested to ensure stable lead placement and pacemaker capture. Aortic root angiography was performed in order to determine the optimal angiographic angle for valve deployment.  TRANSFEMORAL ACCESS:  A micropuncture kit was used to gain access to the left femoral artery using u/s guidance. Position confirmed with angiography. Pre-closure with double ProGlide closure devices. The patient was heparinized systemically and ACT verified > 250 seconds.    A 14 Fr transfemoral E-sheath was introduced into the left femoral artery after progressively dilating over an Amplatz superstiff wire. An AL-2 catheter was used to direct a  straight-tip exchange length wire across the native aortic valve into the left ventricle. This was exchanged out for a pigtail catheter and position was confirmed in the LV apex. Simultaneous LV and Ao pressures were recorded.  The pigtail catheter was then exchanged for an Amplatz Extra-stiff wire in the LV apex.   TRANSCATHETER HEART VALVE DEPLOYMENT:  An Edwards Sapien 3 THV (size 26 mm) was prepared and crimped per manufacturer's guidelines, and the proper orientation of the valve is confirmed on the [employer] delivery system. The valve was advanced through the introducer sheath using normal technique until in an appropriate position in the abdominal aorta beyond the sheath tip. The balloon was then retracted and using the fine-tuning wheel was centered on the valve. The valve was then advanced across the aortic arch using appropriate flexion of the catheter. The valve was carefully positioned across the aortic valve annulus. The Commander catheter was retracted using normal technique. Once final position of the valve has been confirmed by angiographic assessment, the valve is deployed while temporarily holding ventilation and during rapid ventricular pacing to maintain systolic blood pressure < 50 mmHg and pulse pressure < 10 mmHg. The balloon inflation is held for >3 seconds after reaching full deployment volume. Once the balloon has fully deflated the balloon is retracted into the ascending aorta and valve function is assessed using TTE. There is felt to be no paravalvular leak and no central aortic insufficiency.  The patient's hemodynamic recovery following valve deployment is good.  The deployment balloon and guidewire are both removed. Echo demostrated acceptable post-procedural gradients, stable mitral valve function, and no AI.   PROCEDURE COMPLETION:  The sheath was then removed and closure devices were completed. Protamine was administered once femoral arterial repair was complete. The  temporary pacemaker, pigtail catheters  and femoral sheaths were removed with Mynx closure devices in the artery and the vein.   The patient tolerated the procedure well and is transported to the surgical intensive care in stable condition. There were no immediate intraoperative complications. All sponge instrument and needle counts are verified correct at completion of the operation.   No blood products were administered during the operation.  The patient received a total of 28.4 mL of intravenous contrast during the procedure.  Lauree Chandler MD 02/05/2018 9:48 AM

## 2018-02-05 NOTE — Op Note (Signed)
[facility]   MULTIDISCIPLINARY HEART VALVE TEAM   TAVR OPERATIVE NOTE   Date of Procedure:  02/05/2018  Preoperative Diagnosis: Severe Aortic Stenosis   Postoperative Diagnosis: Same   Procedure:    Transcatheter Aortic Valve Replacement - Percutaneous Left Transfemoral Approach  Edwards Sapien 3 THV (size 26 mm, model # 9600TFX, serial # [id number])   Co-Surgeons:  Gaye Pollack, MD and Lauree Chandler, MD   Anesthesiologist:  Laurie Panda, MD  Echocardiographer:  Jenkins Rouge, MD  Pre-operative Echo Findings:  Severe aortic stenosis  mild left ventricular systolic dysfunction  Post-operative Echo Findings:  No paravalvular leak  mild left ventricular systolic dysfunction   BRIEF CLINICAL NOTE AND INDICATIONS FOR SURGERY  This 84 year old gentleman has stage D, severe, symptomatic low gradient aortic stenosis with New York Heart Association class III symptoms of exertional fatigue and shortness of breath consistent with chronic diastolic congestive heart failure. I have personally reviewed his 2D echocardiogram, cardiac catheterization, and CTA studies. His echocardiogram shows a trileaflet aortic valve with severe thickening and moderate calcification of the aortic valve leaflets with severely restricted mobility and looks like a severely stenotic valve despite only having a mean gradient of 26 mmHg. Cardiac catheterization shows patent bypass grafts and otherwise nonobstructive disease. His mean gradient was only 14.2 mmHg although again by echocardiogram his valve looks severely stenotic. Left ventricular end-diastolic pressure was elevated at 18 mmHg and he had moderate to severe pulmonary hypertension. Ideally transcatheter aortic valve replacement would be the best treatment for his aortic stenosis. Unfortunately his CTA of the chest done as part of his work-up showed a 5.2 x 2.6 x 3.2 cm right perihilar mass that was concerning for a recurrent lung  cancer given his prior history of right upper lobectomy for lung cancer and subsequent recurrence a year ago treated with radiation therapy. He also has a small right pleural effusion which could be malignant. There is no significant lymphadenopathy within the mediastinum and no signs of solid organ or bone involvement. A PET scan was done which showed moderate metabolic activity only along the lateral margin of this perihilar mass that was much less than expected for a recurrent bronchogenic carcinoma. We discussed his case and reviewed the films at the multidisciplinary thoracic oncology meeting and it was felt that this may be inflammatory due to his prior history of XRT to this region. Given the severity of his aortic stenosis it was felt by Dr. Julien Nordmann that we should proceed with TAVR and plan to followup the hilar mass with a repeat CT in a few months.  The patient and his wife were counseled at length regarding treatment alternatives for management of severe symptomatic aortic stenosis. The risks and benefits of surgical intervention has been discussed in detail. Long-term prognosis with medical therapy was discussed. Alternative approaches such as conventional surgical aortic valve replacement, transcatheter aortic valve replacement, and palliative medical therapy were compared and contrasted at length. This discussion was placed in the context of the patient's own specific clinical presentation and past medical history. All of their questions have been addressed.   Following the decision to proceed with transcatheter aortic valve replacement, a discussion was held regarding what types of management strategies would be attempted intraoperatively in the event of life-threatening complications, including whether or not the patient would be considered a candidate for the use of cardiopulmonary bypass and/or conversion to open sternotomy for attempted surgical intervention. I don't think he is a candidate  for open  sternotomy given his age of 12, prior CABG and hx of recurrent lung cancer s/p XRT.   The patient has been advised of a variety of complications that might develop including but not limited to risks of death, stroke, paravalvular leak, aortic dissection or other major vascular complications, aortic annulus rupture, device embolization, cardiac rupture or perforation, mitral regurgitation, acute myocardial infarction, arrhythmia, heart block or bradycardia requiring permanent pacemaker placement, congestive heart failure, respiratory failure, renal failure, pneumonia, infection, other late complications related to structural valve deterioration or migration, or other complications that might ultimately cause a temporary or permanent loss of functional independence or other long term morbidity. The patient provides full informed consent for the procedure as described and all questions were answered.   During the course of the patient's preoperative work up they have been evaluated comprehensively by a multidisciplinary team of specialists coordinated through the [facility] in the [facility].  They have been demonstrated to suffer from symptomatic severe aortic stenosis as noted above. The patient has been counseled extensively as to the relative risks and benefits of all options for the treatment of severe aortic stenosis including long term medical therapy, conventional surgery for aortic valve replacement, and transcatheter aortic valve replacement.  All questions have been answered, and the patient provides full informed consent for the operation as described.   DETAILS OF THE OPERATIVE PROCEDURE  PREPARATION:    The patient is brought to the operating room on the above mentioned date and central monitoring was established by the anesthesia team including placement of a central venous line and radial arterial line. The patient is placed in  the supine position on the operating table.  Intravenous antibiotics are administered. The patient is monitored closely throughout the procedure under conscious sedation.  Baseline transthoracic echocardiogram was performed. The patient's chest, abdomen, both groins, and both lower extremities are prepared and draped in a sterile manner. A time out procedure is performed.   PERIPHERAL ACCESS:    Using the modified Seldinger technique, femoral arterial and venous access was obtained with placement of 6 Fr sheaths on the right side.  A pigtail diagnostic catheter was passed through the right arterial sheath under fluoroscopic guidance into the aortic root.  A temporary transvenous pacemaker catheter was passed through the right femoral venous sheath under fluoroscopic guidance into the right ventricle.  The pacemaker was tested to ensure stable lead placement and pacemaker capture. Aortic root angiography was performed in order to determine the optimal angiographic angle for valve deployment.   TRANSFEMORAL ACCESS:   Percutaneous transfemoral access and sheath placement was performed using ultrasound guidance.  The left common femoral artery was cannulated using a micropuncture needle and appropriate location was verified using hand injection angiogram.  A pair of Abbott Perclose percutaneous closure devices were placed and a 6 French sheath replaced into the femoral artery.  The patient was heparinized systemically and ACT verified > 250 seconds.    A 14 Fr transfemoral E-sheath was introduced into the left femoral artery after progressively dilating over an Amplatz superstiff wire. An AL-2 catheter was used to direct a straight-tip exchange length wire across the native aortic valve into the left ventricle. This was exchanged out for a pigtail catheter and position was confirmed in the LV apex. Simultaneous LV and Ao pressures were recorded.  The pigtail catheter was exchanged for an Amplatz  Extra-stiff wire in the LV apex.   BALLOON AORTIC VALVULOPLASTY:   Not  performed  TRANSCATHETER HEART VALVE DEPLOYMENT:   An Edwards Sapien 3 transcatheter heart valve (size 26 mm, model #9600TFX, serial #[id number]) was prepared and crimped per manufacturer's guidelines, and the proper orientation of the valve is confirmed on the [employer] delivery system. The valve was advanced through the introducer sheath using normal technique until in an appropriate position in the abdominal aorta beyond the sheath tip. The balloon was then retracted and using the fine-tuning wheel was centered on the valve. The valve was then advanced across the aortic arch using appropriate flexion of the catheter. The valve was carefully positioned across the aortic valve annulus. The Commander catheter was retracted using normal technique. Once final position of the valve has been confirmed by angiographic assessment, the valve is deployed while temporarily holding ventilation and during rapid ventricular pacing to maintain systolic blood pressure < 50 mmHg and pulse pressure < 10 mmHg. The balloon inflation is held for >3 seconds after reaching full deployment volume. Once the balloon has fully deflated the balloon is retracted into the ascending aorta and valve function is assessed using echocardiography. There is felt to be no paravalvular leak and no central aortic insufficiency.  The patient's hemodynamic recovery following valve deployment is good.  The deployment balloon and guidewire are both removed.    PROCEDURE COMPLETION:   The sheath was removed and femoral artery closure performed using the previously placed Perclose devices.   Protamine was administered once femoral arterial repair was complete. The temporary pacemaker, pigtail catheters and femoral sheaths were removed with manual pressure used for hemostasis.   The patient tolerated the procedure well and is transported to the surgical intensive care in  stable condition. There were no immediate intraoperative complications. All sponge instrument and needle counts are verified correct at completion of the operation.   No blood products were administered during the operation.  The patient received a total of 28.4 mL of intravenous contrast during the procedure.   Gaye Pollack, MD 02/05/2018 12:59 PM

## 2018-02-05 NOTE — Progress Notes (Signed)
Patient arrived to OR alert and oriented. Patient able to confirm name, DOB, procedure, NDKA, no metal in body, npo status and no pain at this time. Patient able to move over to OR table with minimal assistance.   Leatha Gilding, RN

## 2018-02-05 NOTE — Progress Notes (Signed)
  [facility] VALVE TEAM  Patient doing well s/p TAVR. He is hemodynamically stable. Groin sites stable. ECG with sinus bradycardia but no high grade block. Arterial line discontinued and transferred to 4E. Plan for early ambulation after bedrest completed and hopeful discharge over the next 24-48 hours.   Angelena Form PA-C  [facility]  Pager [phone]

## 2018-02-05 NOTE — Progress Notes (Signed)
Patient bed rest completed patient stood to use restroom. B groins level 0 no evidence of bleeding or hematoma noted at this time. Patient to chair. Will monitor patient. Trevino Wyatt, Bettina Gavia RN

## 2018-02-05 NOTE — Interval H&P Note (Signed)
History and Physical Interval Note:  02/05/2018 7:27 AM  Austin Lloyd  has presented today for surgery, with the diagnosis of Severe Aortic Stenosis  The various methods of treatment have been discussed with the patient and family. After consideration of risks, benefits and other options for treatment, the patient has consented to  Procedure(s): TRANSCATHETER AORTIC VALVE REPLACEMENT, TRANSFEMORAL (N/A) TRANSESOPHAGEAL ECHOCARDIOGRAM (TEE) (N/A) as a surgical intervention .  The patient's history has been reviewed, patient examined, no change in status, stable for surgery.  I have reviewed the patient's chart and labs.  Questions were answered to the patient's satisfaction.     Shortness of breath: Yes.   If yes: with what activity?: mild exertion Worse than previously noted?: No.  New edema, PND, orthopnea: No.  Recent decrease in activity i.e. more difficulty walking to mailbox, climbing stairs, etc: No.  Changes in sleeping i.e. need to utilize to sleep on more pillows, sitting up, etc: No.  Changes since last seen in pre-op visit: No.   BNP 417  Austin Lloyd

## 2018-02-06 ENCOUNTER — Inpatient Hospital Stay (HOSPITAL_COMMUNITY): Payer: Medicare Other

## 2018-02-06 ENCOUNTER — Encounter (HOSPITAL_COMMUNITY): Payer: Self-pay | Admitting: Physician Assistant

## 2018-02-06 DIAGNOSIS — Z952 Presence of prosthetic heart valve: Secondary | ICD-10-CM

## 2018-02-06 DIAGNOSIS — R918 Other nonspecific abnormal finding of lung field: Secondary | ICD-10-CM | POA: Diagnosis present

## 2018-02-06 DIAGNOSIS — C9110 Chronic lymphocytic leukemia of B-cell type not having achieved remission: Secondary | ICD-10-CM | POA: Diagnosis present

## 2018-02-06 DIAGNOSIS — I5043 Acute on chronic combined systolic (congestive) and diastolic (congestive) heart failure: Secondary | ICD-10-CM

## 2018-02-06 DIAGNOSIS — I350 Nonrheumatic aortic (valve) stenosis: Principal | ICD-10-CM

## 2018-02-06 DIAGNOSIS — I340 Nonrheumatic mitral (valve) insufficiency: Secondary | ICD-10-CM

## 2018-02-06 DIAGNOSIS — C679 Malignant neoplasm of bladder, unspecified: Secondary | ICD-10-CM | POA: Diagnosis present

## 2018-02-06 DIAGNOSIS — C3490 Malignant neoplasm of unspecified part of unspecified bronchus or lung: Secondary | ICD-10-CM | POA: Diagnosis present

## 2018-02-06 LAB — BASIC METABOLIC PANEL
Anion gap: 6 (ref 5–15)
BUN: 31 mg/dL — ABNORMAL HIGH (ref 8–23)
CO2: 24 mmol/L (ref 22–32)
Calcium: 8.7 mg/dL — ABNORMAL LOW (ref 8.9–10.3)
Chloride: 106 mmol/L (ref 98–111)
Creatinine, Ser: 1.48 mg/dL — ABNORMAL HIGH (ref 0.61–1.24)
GFR calc Af Amer: 48 mL/min — ABNORMAL LOW (ref >60)
GFR, EST NON AFRICAN AMERICAN: 41 mL/min — AB (ref >60)
GLUCOSE: 162 mg/dL — AB (ref 70–99)
POTASSIUM: 4.5 mmol/L (ref 3.5–5.1)
SODIUM: 136 mmol/L (ref 135–145)

## 2018-02-06 LAB — CBC
HCT: 34.4 % — ABNORMAL LOW (ref 39.0–52.0)
Hemoglobin: 11.1 g/dL — ABNORMAL LOW (ref 13.0–17.0)
MCH: 29.6 pg (ref 26.0–34.0)
MCHC: 32.3 g/dL (ref 30.0–36.0)
MCV: 91.7 fL (ref 80.0–100.0)
PLATELETS: 149 10*3/uL — AB (ref 150–400)
RBC: 3.75 MIL/uL — ABNORMAL LOW (ref 4.22–5.81)
RDW: 14.5 % (ref 11.5–15.5)
WBC: 8.2 10*3/uL (ref 4.0–10.5)
nRBC: 0 % (ref 0.0–0.2)

## 2018-02-06 LAB — GLUCOSE, CAPILLARY
GLUCOSE-CAPILLARY: 307 mg/dL — AB (ref 70–99)
Glucose-Capillary: 152 mg/dL — ABNORMAL HIGH (ref 70–99)

## 2018-02-06 LAB — MAGNESIUM: MAGNESIUM: 1.8 mg/dL (ref 1.7–2.4)

## 2018-02-06 LAB — ECHOCARDIOGRAM LIMITED
Height: 64 in
Weight: 2423.3 oz

## 2018-02-06 MED ORDER — METOPROLOL SUCCINATE ER 25 MG PO TB24
25.0000 mg | ORAL_TABLET | Freq: Two times a day (BID) | ORAL | Status: DC
  Administered 2018-02-06: 25 mg via ORAL
  Filled 2018-02-06: qty 1

## 2018-02-06 MED ORDER — LOSARTAN POTASSIUM 25 MG PO TABS: 12.5000 mg | ORAL_TABLET | Freq: Every day | ORAL | Status: DC

## 2018-02-06 MED ORDER — CLOPIDOGREL BISULFATE 75 MG PO TABS: 75.0000 mg | ORAL_TABLET | Freq: Every day | ORAL | 1 refills | Status: DC

## 2018-02-06 MED ORDER — FUROSEMIDE 20 MG PO TABS
20.0000 mg | ORAL_TABLET | Freq: Every day | ORAL | Status: DC
  Administered 2018-02-06: 20 mg via ORAL
  Filled 2018-02-06: qty 1

## 2018-02-06 NOTE — Progress Notes (Signed)
CARDIAC REHAB PHASE I   PRE:  Rate/Rhythm: 97 SR    BP: sitting 146/66    SaO2: 95 RA  MODE:  Ambulation: 420 ft   POST:  Rate/Rhythm: 121 ST    BP: sitting 155/69     SaO2: 89 RA then 85 RA briefly with sitting, up to 95 RA with rest  Pt eager to walk. Stood independently. Steady walking, used gait belt but did not need. Pt HR elevated with distance, some SOB but pt not affected. His SaO2 decreased briefly when he initially sat down but increased with pursed lip breathing. Discussed pursed lip breathing with walking at home. He also has a pulse ox and IS. Discussed use. Gave pt guidelines for restrictions and walking. Will refer to [place], ACSM 02/06/2018 9:41 AM

## 2018-02-06 NOTE — Progress Notes (Signed)
  Echocardiogram 2D Echocardiogram limited post TAVR has been performed.  Austin Lloyd 02/06/2018, 8:26 AM

## 2018-02-06 NOTE — Anesthesia Postprocedure Evaluation (Signed)
Anesthesia Post Note  Patient: Niko Penson Maele  Procedure(s) Performed: TRANSCATHETER AORTIC VALVE REPLACEMENT, TRANSFEMORAL (N/A Chest) INTRAOPERATIVE TRANSTHORACIC ECHOCARDIOGRAM (Chest)     Patient location during evaluation: ICU Anesthesia Type: MAC Level of consciousness: awake and alert Pain management: pain level controlled Vital Signs Assessment: post-procedure vital signs reviewed and stable Respiratory status: spontaneous breathing, nonlabored ventilation, respiratory function stable and patient connected to nasal cannula oxygen Cardiovascular status: stable and blood pressure returned to baseline Postop Assessment: no apparent nausea or vomiting Anesthetic complications: no    Last Vitals:  Vitals:   02/06/18 0209 02/06/18 0510  BP: 128/71 138/70  Pulse: 83 81  Resp: (!) 23 17  Temp: 37 C 36.8 C  SpO2: 96% 95%    Last Pain:  Vitals:   02/06/18 0510  TempSrc: Oral  PainSc: 0-No pain                 Lennart Gladish

## 2018-02-06 NOTE — Discharge Summary (Addendum)
[facility]  Discharge Summary    Patient ID: Austin Lloyd MRN: [id number]; DOB: 07-11-1929  Admit date: 02/05/2018 Discharge date: 02/06/2018  Primary Care Provider: Shelda Pal, DO  Primary Cardiologist: [name] / [name] & Dr. Cyndia Bent (TAVR)  Discharge Diagnoses    Principal Problem:   S/P TAVR (transcatheter aortic valve replacement) Active Problems:   Type 2 diabetes mellitus without complication ([place])   Essential hypertension   Cardiomyopathy ([place])   Hyperlipidemia   Coronary artery disease involving native coronary artery of native heart without angina pectoris   Severe aortic stenosis   Lung cancer (HCC)   Bladder cancer (HCC)   Lung mass   Chronic lymphocytic leukemia (HCC)   Acute on chronic combined systolic and diastolic CHF (congestive heart failure) (HCC)   Allergies No Known Allergies  Diagnostic Studies/Procedures    TAVR OPERATIVE NOTE   Date of Procedure:                02/05/2018  Preoperative Diagnosis:      Severe Aortic Stenosis   Postoperative Diagnosis:    Same   Procedure:        Transcatheter Aortic Valve Replacement - Percutaneous Left Transfemoral Approach             Edwards Sapien 3 THV (size 26 mm, model # 9600TFX, serial # [id number])              Co-Surgeons:                        Gaye Pollack, MD and Lauree Chandler, MD  Pre-operative Echo Findings: ? Severe aortic stenosis ? mild left ventricular systolic dysfunction  Post-operative Echo Findings: ? No paravalvular leak ? mild left ventricular systolic dysfunction  _____________   Echo 02/06/18:  Study Conclusions - Left ventricle: The cavity size was mildly dilated. Wall   thickness was increased in a pattern of mild LVH. Systolic   function was mildly to moderately reduced. The estimated ejection   fraction was in the range of 40% to 45%. The study is not  technically sufficient to allow evaluation of LV diastolic   function. - Aortic valve: Post TAVR with 26 mm Sapien 3 valve. Trivial peri   valvular regurgitation. Valve area (VTI): 1.2 cm^2. Valve area   (Vmax): 1.05 cm^2. Valve area (Vmean): 1.25 cm^2. - Mitral valve: Calcified annulus. There was mild regurgitation. - Left atrium: The atrium was mildly dilated. - Atrial septum: No defect or patent foramen ovale was identified.  History of Present Illness     Austin Lloyd is a 84 y.o. male with a history of bladder cancer,lung cancer, prostate cancer,CLL,, CAD s/p 2V CABG in 2016 ([place]), diabetes, HTN, ischemic cardiomyopathy, CKD stage III, PVCs and severe aortic stenosis who presented to [facility] on 02/05/18 for planned TAVR.  The patient lived in [place] prior to moving here about 3 months ago to live with his daughter. He was diagnosed with CLL in 2012 and was treated with chemotherapy. 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 The pathology showed stage Ib with visceral pleural invasion but negative margins and no lymph node involvement. Patient subsequently underwent coronary bypass graft surgery in 2016 with a LIMA to the LAD and a saphenous vein graft to the right coronary artery. He has  reportedly had ischemic cardiomyopathy with low ejection fraction in the past. He was found to have a recurrence of lung cancer in February 2018 that was diagnosed by bronchoscopy. He underwent treatment with stereotactic body radiotherapy to the lesion. He then reportedly had a new right lower lobe nodule show up in 01/2017 that was initially concerning for recurrence but then resolved spontaneously. Since he moved to [facility] he has been seen by [name] with oncology and plans were made for a follow-up chest CT scan in early 2020. He had known aortic stenosis and so that he was told by  his cardiologist in [place] that it appeared to be getting worse. He was seen by [name] in August 2019 and underwent a follow-up echocardiogram on 11/06/2017 which showed a trileaflet severely thickened and calcified aortic valve with severely restricted leaflet mobility. The mean gradient was 26 mmHg and a peak gradient 48 mmHg. Dimensionless index was 0.2 with a valve area of 0.89 cm. Left ventricular ejection fraction was 40 to 45% with grade 2 diastolic dysfunction and left ventricular dilatation. Unfortunately his CTA of the chest done as part of his work-up showed a 5.2 x 2.6 x 3.2 cm right perihilar mass that was concerning for a recurrent lung cancer given his prior history of right upper lobectomy for lung cancer and subsequent recurrence a year ago treated with radiation therapy. He also has a small right pleural effusion which could be malignant. There is no significant lymphadenopathy within the mediastinum and no signs of solid organ or bone involvement. A PET scan was done which showed moderate metabolic activity only along the lateral margin of this perihilar mass that was much less than expected for a recurrent bronchogenic carcinoma. We discussed his case and reviewed the films at the multidisciplinary thoracic oncology meeting and it was felt that this may be inflammatory due to his prior history of XRT to this region. Given the severity of his aortic stenosis it was felt by Dr. Julien Nordmann that we should proceed with TAVR and plan to followup the hilar mass with a repeat CT in a few months.  The patient has been evaluated by the multidisciplinary valve team and felt to have severe, symptomatic aortic stenosis and to be a suitable candidate for TAVR, which was set up for 02/05/18.     Hospital Course     Consultants: none  Severe AS: s/p successful TAVR with a 26 mm Edwards Sapien 3 THV via the TF approach on 02/05/18. Post operative echo showed EF 40-45%, normally functioning TAVR  with trivial PVL; mean gradient 8 mm Hg. Groin sites are stable. ECG with sinus and PVCs and no high grade heart block. Continue Asprin and Plavix. Plan for DC home today with follow in the office in 2 weeks.   Acute on chronic combined S/D CHF: as evidenced by an elevated BNP ~450 on pre admission labs. Post op CXR shows some pulmonary vascular congestion. This has been treated with TAVR. I will resume home lasix 20mg  daily.   CKD stage III: creat around baseline of 1.48.    HTN: BP well controlled today. Will resume home Toprol XL 25mg  BID and lasix 20mg  daily. Will hold home Losartan 12.5mg  daily at discharge and add back as an outpatient.   History of lung cancer: pre TAVR CT scan showed a perihilar mass that is being followed closely by oncology. He has a repeat CT scan scheduled for early Jan  CAD s/p CABG: pre TAVR cath showed  patent bypass grafts. Continue medical therapy  _____________  Discharge Vitals Blood pressure 136/73, pulse 81, temperature 98.6 F (37 C), temperature source Oral, resp. rate 18, height 5\' 4"  (1.626 m), weight 68.7 kg, SpO2 97 %.  Filed Weights   02/05/18 0601 02/06/18 0500  Weight: 67.6 kg 68.7 kg   ROS:   Please see the history of present illness.    ROS All other systems reviewed and are negative.   PHYSICAL EXAM:   VS:  BP 136/73 (BP Location: Left Arm)   Pulse 81   Temp 98.6 F (37 C) (Oral)   Resp 18   Ht 5\' 4"  (1.626 m)   Wt 68.7 kg   SpO2 97%   BMI 26.00 kg/m    GEN: Well nourished, well developed, in no acute distress HEENT: normal Neck: no JVD or masses Cardiac: RRR; no murmurs, rubs, or gallops,no edema  Respiratory:  clear to auscultation bilaterally, normal work of breathing GI: soft, nontender, nondistended, + BS MS: no deformity or atrophy Skin: warm and dry, no rash. Groin sites soft with no hematoma Neuro:  Alert and Oriented x 3, Strength and sensation are intact Psych: euthymic mood, full affect   Labs & Radiologic  Studies    CBC Recent Labs    02/05/18 1003 02/06/18 0510  WBC  --  8.2  HGB 10.2* 11.1*  HCT 30.0* 34.4*  MCV  --  91.7  PLT  --  621*   Basic Metabolic Panel Recent Labs    02/05/18 1003 02/06/18 0510  NA 139 136  K 5.4* 4.5  CL 107 106  CO2  --  24  GLUCOSE 213* 162*  BUN 37* 31*  CREATININE 1.40* 1.48*  CALCIUM  --  8.7*  MG  --  1.8   Liver Function Tests No results for input(s): AST, ALT, ALKPHOS, BILITOT, PROT, ALBUMIN in the last 72 hours. No results for input(s): LIPASE, AMYLASE in the last 72 hours. Cardiac Enzymes No results for input(s): CKTOTAL, CKMB, CKMBINDEX, TROPONINI in the last 72 hours. BNP Invalid input(s): POCBNP D-Dimer No results for input(s): DDIMER in the last 72 hours. Hemoglobin A1C No results for input(s): HGBA1C in the last 72 hours. Fasting Lipid Panel No results for input(s): CHOL, HDL, LDLCALC, TRIG, CHOLHDL, LDLDIRECT in the last 72 hours. Thyroid Function Tests No results for input(s): TSH, T4TOTAL, T3FREE, THYROIDAB in the last 72 hours.  Invalid input(s): FREET3 _____________  Dg Chest 2 View  Result Date: 02/01/2018 CLINICAL DATA:  Heart valve replacement surgery. EXAM: CHEST - 2 VIEW COMPARISON:  PET-CT 01/18/2018.  CT 01/09/2018. FINDINGS: Mediastinum stable. Prior CABG. Cardiomegaly. No pulmonary venous congestion. Surgical sutures and adjacent density noted in the right upper lung unchanged. Reference is made to prior PET-CT report 01/18/2018. Bilateral pleural thickening noted consistent scarring. IMPRESSION: 1. Postsurgical change and persistent density in the right upper lung. Reference is made to prior PET-CT report 01/18/2018. 2.  Prior CABG.  Cardiomegaly.  No pulmonary venous congestion. Electronically Signed   By: Marcello Moores  Register   On: 02/01/2018 16:00   Nm Pet Image Initial (pi) Skull Base To Thigh  Result Date: 01/18/2018 CLINICAL DATA:  Initial treatment strategy for pulmonary nodule. Incidental finding.  Additional history of bladder cancer and prostate cancer. EXAM: NUCLEAR MEDICINE PET SKULL BASE TO THIGH TECHNIQUE: 6.8 mCi F-18 FDG was injected intravenously. Full-ring PET imaging was performed from the skull base to thigh after the radiotracer. CT data was obtained and used for attenuation  correction and anatomic localization. Fasting blood glucose: 67 mg/dl COMPARISON:  CT 1023 19 FINDINGS: Mediastinal blood pool activity: SUV max 1.6 cm NECK: No hypermetabolic lymph nodes in the neck. Incidental CT findings: none CHEST: RIGHT perihilar angular consolidation associated with surgical clips and calcifications measures approximately 4.0 by 3.4 cm (image 71/4). This angular mass has mild to moderate metabolic activity along the lateral portion of the lesion where the calcifications/vascular clips are present with SUV max equal 4.1. The more medial portion lesion does not have significant metabolic activity. There is metabolic activity associated with the anterior aorta arch which is benign. Benign activity within the musculature adjacent to the scapula with SUV max equal 4.5 No hypermetabolic mediastinal lymph nodes. There is a small layering RIGHT effusion is similar volume to comparison exam Incidental CT findings: none ABDOMEN/PELVIS: There is bilateral metabolic activity associated adrenal glands with SUV max equal 3.6 and 4.4. This is greater than liver activity (SUV max equal 2.6). There is no lesion associated with the adrenal glands. Incidental CT findings: Large hydrocele in the RIGHT hemiscrotum SKELETON: No focal hypermetabolic activity to suggest skeletal metastasis. Incidental CT findings: none IMPRESSION: 1. Moderate metabolic activity along the lateral margin of the RIGHT perihilar mass is less than would be expected for bronchogenic carcinoma of this size. Favor postsurgical inflammatory activity. Cannot exclude recurrence at the lateral margin adjacent to the clip/calcifications. Recommend  follow-up CT with contrast for future surveillance. No comparison available at this time 2. Bilateral hypermetabolic adrenal glands without nodular lesion is favored benign hyperplasia. 3. Small RIGHT effusion similar to prior. 4. Large RIGHT hydrocele of the RIGHT hemiscrotum Electronically Signed   By: Suzy Bouchard M.D.   On: 01/18/2018 16:40   Ct Coronary Morph W/cta Cor W/score W/ca W/cm &/or Wo/cm  Addendum Date: 01/10/2018   ADDENDUM REPORT: 01/10/2018 08:37 CLINICAL DATA:  84 year old male with known CAD, s/p CABG and severe aortic stenosis being evaluated for a TAVR procedure. EXAM: Cardiac TAVR CT TECHNIQUE: The patient was scanned on a [employer]. A 120 kV retrospective scan was triggered in the descending thoracic aorta at 111 HU's. Gantry rotation speed was 250 msecs and collimation was .6 mm. No beta blockade or nitro were given. The 3D data set was reconstructed in 5% intervals of the R-R cycle. Systolic and diastolic phases were analyzed on a dedicated work station using MPR, MIP and VRT modes. The patient received 80 cc of contrast. FINDINGS: Aortic Valve: Trileaflet aortic valve with severely thickened but only mildly calcified leaflets with severely restricted leaflet opening. There are no calcifications extending into the LVOT. Aorta: Normal size with mild diffuse atherosclerotic plaque and calcifications, no dissection. Sinotubular Junction: 29 x 29 mm Ascending Thoracic Aorta: 34 x 34 mm Aortic Arch: 28 x 25 mm Descending Thoracic Aorta: 26 x 25 mm Sinus of Valsalva Measurements: Non-coronary: 36 mm Right -coronary: 35 mm Left -coronary: 36 mm Coronary Artery Height above Annulus: Left Main: 14.7 mm Right Coronary: 20.6 mm Virtual Basal Annulus Measurements: Maximum/Minimum Diameter: 29.0 x 20.0 mm Mean Diameter: 24.7 mm Perimeter: 90.0 mm Area:  480 mm2 Optimum Fluoroscopic Angle for Delivery: LAO 10 CAU 12 IMPRESSION: 1. Trileaflet aortic valve with severely thickened but  only mildly calcified leaflets with severely restricted leaflet opening. There are no calcifications extending into the LVOT. Annular measurements suitable for delivery of a 26 mm Edwards-SAPIEN 3 valve. 2. Sufficient coronary to annulus distance. 3. Optimum Fluoroscopic Angle for Delivery:  LAO 10 CAU 12.  4. No thrombus in the left atrial appendage. 5. Dilated pulmonary artery measuring 39 mm. Electronically Signed   By: Ena Dawley   On: 01/10/2018 08:37   Result Date: 01/10/2018 EXAM: OVER-READ INTERPRETATION  CT CHEST The following report is an over-read performed by radiologist Dr. Vinnie Langton of [facility], [place] on 01/09/2018. This over-read does not include interpretation of cardiac or coronary anatomy or pathology. The coronary CTA interpretation by the cardiologist is attached. COMPARISON:  None. FINDINGS: Extracardiac findings will be described separately under dictation for contemporaneously obtained CTA chest, abdomen and pelvis. IMPRESSION: Please see separate dictation for contemporaneously obtained CTA of the chest, abdomen and pelvis dated 01/09/2018 for full description of relevant extracardiac findings. Electronically Signed: By: Vinnie Langton M.D. On: 01/09/2018 15:39   Dg Chest Port 1 View  Result Date: 02/05/2018 CLINICAL DATA:  Status post trans catheter aortic valve replacement. EXAM: PORTABLE CHEST 1 VIEW COMPARISON:  PA and lateral chest x-ray of February 01, 2018 FINDINGS: The lungs are mildly hyperinflated. There is decreased density at the right lung base. The cardiac silhouette remains enlarged. The pulmonary vascularity is mildly prominent centrally. Patchy airspace opacity in the right perihilar region centrally and peripherally is slightly more conspicuous today but there is some overlap with the right central pulmonary artery. The aortic valve cage is in reasonable position. The right internal jugular venous catheter tip projects over the proximal SVC.  IMPRESSION: COPD. No acute pneumonia. Cardiomegaly with mild central pulmonary vascular congestion. Persistent abnormal mass in the right perihilar region Electronically Signed   By: David  Martinique M.D.   On: 02/05/2018 12:07   Ct Angio Chest Aorta W &/or Wo Contrast  Result Date: 01/09/2018 CLINICAL DATA:  84 year old male with history of severe aortic stenosis. Preprocedural study prior to potential transcatheter aortic valve replacement (TAVR) procedure. EXAM: CT ANGIOGRAPHY CHEST, ABDOMEN AND PELVIS TECHNIQUE: Multidetector CT imaging through the chest, abdomen and pelvis was performed using the standard protocol during bolus administration of intravenous contrast. Multiplanar reconstructed images and MIPs were obtained and reviewed to evaluate the vascular anatomy. CONTRAST:  139mL ISOVUE-370 IOPAMIDOL (ISOVUE-370) INJECTION 76% COMPARISON:  No priors. FINDINGS: CTA CHEST FINDINGS Cardiovascular: Heart size is enlarged with left ventricular dilatation. There is no significant pericardial fluid, thickening or pericardial calcification. There is aortic atherosclerosis, as well as atherosclerosis of the great vessels of the mediastinum and the coronary arteries, including calcified atherosclerotic plaque in the left main, left anterior descending, left circumflex and right coronary arteries. Status post median sternotomy for CABG including LIMA to the LAD. Thickening calcification of the aortic valve. Mediastinum/Lymph Nodes: No pathologically enlarged mediastinal or hilar lymph nodes. Esophagus is unremarkable in appearance. No axillary lymphadenopathy. Lungs/Pleura: In the central aspect of the right upper lobe there is a perihilar mass measuring 5.2 x 2.6 x 3.2 cm (axial image 40 of series 14 and coronal image 84 of series 16) which is macrolobulated with spiculated margins and retraction of adjacent structures, including spiculations extending to the overlying pleura which is thickened. Internal areas of  potential calcification, and likely multiple internal collateral vessels. This abuts the hilum where there are nonenlarged lymph nodes. Small right pleural effusion lying dependently, likely malignant. A few other scattered tiny pulmonary nodules are noted in the lungs, nonspecific. No acute consolidative airspace disease. Trace left pleural effusion lying dependently. Musculoskeletal/Soft Tissues: Median sternotomy. There are no aggressive appearing lytic or blastic lesions noted in the visualized portions of the skeleton. CTA ABDOMEN AND PELVIS FINDINGS  Hepatobiliary: No suspicious cystic or solid hepatic lesions. No intra or extrahepatic biliary ductal dilatation. 2 cm calcified gallstone in the gallbladder. No findings to suggest an acute cholecystitis at this time. Pancreas: No pancreatic mass. No pancreatic ductal dilatation. No pancreatic or peripancreatic fluid or inflammatory changes. Spleen: Unremarkable. Adrenals/Urinary Tract: 2.6 cm low-attenuation lesion in the lower pole the left kidney is compatible with a simple cyst. No suspicious renal lesions. Multifocal cortical thinning in the kidneys bilaterally, most compatible with mild scarring. Bilateral adrenal glands are normal in appearance. No hydroureteronephrosis. Urinary bladder is normal in appearance. Stomach/Bowel: Postoperative changes of gastrojejunostomy. No pathologic dilatation of small bowel or colon. Numerous colonic diverticulae are noted, without surrounding inflammatory changes to suggest an acute diverticulitis at this time. Normal appendix. Vascular/Lymphatic: Aortic atherosclerosis, without evidence of aneurysm or dissection in the abdominal or pelvic vasculature. Vascular findings and measurements pertinent to potential TAVR procedure, as detailed below. Retroaortic left renal vein (normal anatomical variant) incidentally noted. No lymphadenopathy noted in the abdomen or pelvis. Reproductive: Prostate gland and seminal vesicles are  unremarkable in appearance. Other: No significant volume of ascites.  No pneumoperitoneum. Musculoskeletal: There are no aggressive appearing lytic or blastic lesions noted in the visualized portions of the skeleton. VASCULAR MEASUREMENTS PERTINENT TO TAVR: AORTA: Minimal Aortic Diameter-15 x 14 mm Severity of Aortic Calcification-severe RIGHT PELVIS: Right Common Iliac Artery - Minimal Diameter-7.4 x 6.1 mm Tortuosity-mild Calcification-moderate to severe Right External Iliac Artery - Minimal Diameter-5.1 x 4.3 mm Tortuosity-moderate Calcification-mild-to-moderate Right Common Femoral Artery - Minimal Diameter-4.8 x 4.9 mm Tortuosity-mild Calcification-moderate LEFT PELVIS: Left Common Iliac Artery - Minimal Diameter-8.0 x 5.0 mm Tortuosity-mild Calcification-moderate to severe Left External Iliac Artery - Minimal Diameter-6.4 x 4.8 mm Tortuosity-mild Calcification-mild-to-moderate Left Common Femoral Artery - Minimal Diameter-7.1 x 2.9 mm Tortuosity-mild Calcification-moderate Review of the MIP images confirms the above findings. IMPRESSION: 1. Vascular findings and measurements pertinent to potential TAVR procedure, as detailed above. 2. Thickening calcification of the aortic valve, compatible with the reported clinical history of aortic stenosis. 3. Aggressive appearing perihilar mass in the right upper lobe measuring 5.2 x 2.6 x 3.2 cm, highly concerning for primary bronchogenic carcinoma. Further evaluation with PET-CT is recommended in the near future for staging purposes. 4. Cholelithiasis without evidence of acute cholecystitis at this time. 5. Colonic diverticulosis without evidence of acute diverticulitis at this time. 6. Aortic atherosclerosis, in addition to left main and 3 vessel coronary artery disease. Status post median sternotomy for CABG including LIMA to the LAD. 7. Additional incidental findings, as above. These results will be called to the ordering clinician or representative by the  Radiologist Assistant, and communication documented in the PACS or zVision Dashboard. Electronically Signed   By: Vinnie Langton M.D.   On: 01/09/2018 16:47   Vas US Carotid  Result Date: 01/09/2018 Carotid Arterial Duplex Study Indications:   Pre TVAR. Other Factors: Severe AS. Performing Technologist: Toma Copier RVS  Examination Guidelines: A complete evaluation includes B-mode imaging, spectral Doppler, color Doppler, and power Doppler as needed of all accessible portions of each vessel. Bilateral testing is considered an integral part of a complete examination. Limited examinations for reoccurring indications may be performed as noted.  Right Carotid Findings: +----------+--------+--------+--------+---------------------+------------------+           PSV cm/sEDV cm/sStenosisDescribe             Comments           +----------+--------+--------+--------+---------------------+------------------+ CCA Prox  126     29  diffuse and          mild plaque                                          heterogenous                            +----------+--------+--------+--------+---------------------+------------------+ CCA Distal71      20                                   mild intimal                                                              changes            +----------+--------+--------+--------+---------------------+------------------+ ICA Prox  104     36              diffuse and          mild plaque                                          heterogenous                            +----------+--------+--------+--------+---------------------+------------------+ ICA Mid   124     20                                   tortuous           +----------+--------+--------+--------+---------------------+------------------+ ICA Distal95      36                                   tortuous            +----------+--------+--------+--------+---------------------+------------------+ ECA       109     14              diffuse and          mild plaque                                          heterogenous                            +----------+--------+--------+--------+---------------------+------------------+ +----------+--------+-------+--------+-------------------+           PSV cm/sEDV cmsDescribeArm Pressure (mmHG) +----------+--------+-------+--------+-------------------+ Subclavian118                                        +----------+--------+-------+--------+-------------------+ +---------+--------+--+--------+--+ VertebralPSV cm/s96EDV cm/s27 +---------+--------+--+--------+--+  Left Carotid Findings: +----------+--------+--------+--------+---------------------+------------------+  PSV cm/sEDV cm/sStenosisDescribe             Comments           +----------+--------+--------+--------+---------------------+------------------+ CCA Prox  103     26                                   mild intimal                                                              changes            +----------+--------+--------+--------+---------------------+------------------+ CCA Distal88      16                                   mild intimal                                                              changes            +----------+--------+--------+--------+---------------------+------------------+ ICA Prox  126     35              heterogenous and     mild to moderate                                     irregular            plaque with                                                               acoustic shadowing +----------+--------+--------+--------+---------------------+------------------+ ICA Mid   118     40                                                       +----------+--------+--------+--------+---------------------+------------------+ ICA Distal96      20                                   tortuous           +----------+--------+--------+--------+---------------------+------------------+ ECA       148     14              heterogenous         mild to moderate  plaque with                                                               acoustic shadowing +----------+--------+--------+--------+---------------------+------------------+ +----------+--------+--------+--------+-------------------+ SubclavianPSV cm/sEDV cm/sDescribeArm Pressure (mmHG) +----------+--------+--------+--------+-------------------+           113                                         +----------+--------+--------+--------+-------------------+ +---------+--------+--+--------+--+ VertebralPSV cm/s68EDV cm/s17 +---------+--------+--+--------+--+  Summary: Right Carotid: Velocities in the right ICA are consistent with a 1-39% stenosis. Left Carotid: Velocities in the left ICA are consistent with a 1-39% stenosis. Vertebrals:  Bilateral vertebral arteries demonstrate antegrade flow. Subclavians: Normal flow hemodynamics were seen in bilateral subclavian              arteries. *See table(s) above for measurements and observations.  Electronically signed by Curt Jews MD on 01/09/2018 at 7:10:36 PM.    Final    Ct Angio Abd/pel W/ And/or W/o  Result Date: 01/09/2018 CLINICAL DATA:  84 year old male with history of severe aortic stenosis. Preprocedural study prior to potential transcatheter aortic valve replacement (TAVR) procedure. EXAM: CT ANGIOGRAPHY CHEST, ABDOMEN AND PELVIS TECHNIQUE: Multidetector CT imaging through the chest, abdomen and pelvis was performed using the standard protocol during bolus administration of intravenous contrast. Multiplanar reconstructed images and MIPs were obtained  and reviewed to evaluate the vascular anatomy. CONTRAST:  17mL ISOVUE-370 IOPAMIDOL (ISOVUE-370) INJECTION 76% COMPARISON:  No priors. FINDINGS: CTA CHEST FINDINGS Cardiovascular: Heart size is enlarged with left ventricular dilatation. There is no significant pericardial fluid, thickening or pericardial calcification. There is aortic atherosclerosis, as well as atherosclerosis of the great vessels of the mediastinum and the coronary arteries, including calcified atherosclerotic plaque in the left main, left anterior descending, left circumflex and right coronary arteries. Status post median sternotomy for CABG including LIMA to the LAD. Thickening calcification of the aortic valve. Mediastinum/Lymph Nodes: No pathologically enlarged mediastinal or hilar lymph nodes. Esophagus is unremarkable in appearance. No axillary lymphadenopathy. Lungs/Pleura: In the central aspect of the right upper lobe there is a perihilar mass measuring 5.2 x 2.6 x 3.2 cm (axial image 40 of series 14 and coronal image 84 of series 16) which is macrolobulated with spiculated margins and retraction of adjacent structures, including spiculations extending to the overlying pleura which is thickened. Internal areas of potential calcification, and likely multiple internal collateral vessels. This abuts the hilum where there are nonenlarged lymph nodes. Small right pleural effusion lying dependently, likely malignant. A few other scattered tiny pulmonary nodules are noted in the lungs, nonspecific. No acute consolidative airspace disease. Trace left pleural effusion lying dependently. Musculoskeletal/Soft Tissues: Median sternotomy. There are no aggressive appearing lytic or blastic lesions noted in the visualized portions of the skeleton. CTA ABDOMEN AND PELVIS FINDINGS Hepatobiliary: No suspicious cystic or solid hepatic lesions. No intra or extrahepatic biliary ductal dilatation. 2 cm calcified gallstone in the gallbladder. No findings to  suggest an acute cholecystitis at this time. Pancreas: No pancreatic mass. No pancreatic ductal dilatation. No pancreatic or peripancreatic fluid or inflammatory changes. Spleen: Unremarkable. Adrenals/Urinary Tract: 2.6 cm low-attenuation lesion in the lower pole the left kidney is compatible with  a simple cyst. No suspicious renal lesions. Multifocal cortical thinning in the kidneys bilaterally, most compatible with mild scarring. Bilateral adrenal glands are normal in appearance. No hydroureteronephrosis. Urinary bladder is normal in appearance. Stomach/Bowel: Postoperative changes of gastrojejunostomy. No pathologic dilatation of small bowel or colon. Numerous colonic diverticulae are noted, without surrounding inflammatory changes to suggest an acute diverticulitis at this time. Normal appendix. Vascular/Lymphatic: Aortic atherosclerosis, without evidence of aneurysm or dissection in the abdominal or pelvic vasculature. Vascular findings and measurements pertinent to potential TAVR procedure, as detailed below. Retroaortic left renal vein (normal anatomical variant) incidentally noted. No lymphadenopathy noted in the abdomen or pelvis. Reproductive: Prostate gland and seminal vesicles are unremarkable in appearance. Other: No significant volume of ascites.  No pneumoperitoneum. Musculoskeletal: There are no aggressive appearing lytic or blastic lesions noted in the visualized portions of the skeleton. VASCULAR MEASUREMENTS PERTINENT TO TAVR: AORTA: Minimal Aortic Diameter-15 x 14 mm Severity of Aortic Calcification-severe RIGHT PELVIS: Right Common Iliac Artery - Minimal Diameter-7.4 x 6.1 mm Tortuosity-mild Calcification-moderate to severe Right External Iliac Artery - Minimal Diameter-5.1 x 4.3 mm Tortuosity-moderate Calcification-mild-to-moderate Right Common Femoral Artery - Minimal Diameter-4.8 x 4.9 mm Tortuosity-mild Calcification-moderate LEFT PELVIS: Left Common Iliac Artery - Minimal Diameter-8.0 x  5.0 mm Tortuosity-mild Calcification-moderate to severe Left External Iliac Artery - Minimal Diameter-6.4 x 4.8 mm Tortuosity-mild Calcification-mild-to-moderate Left Common Femoral Artery - Minimal Diameter-7.1 x 2.9 mm Tortuosity-mild Calcification-moderate Review of the MIP images confirms the above findings. IMPRESSION: 1. Vascular findings and measurements pertinent to potential TAVR procedure, as detailed above. 2. Thickening calcification of the aortic valve, compatible with the reported clinical history of aortic stenosis. 3. Aggressive appearing perihilar mass in the right upper lobe measuring 5.2 x 2.6 x 3.2 cm, highly concerning for primary bronchogenic carcinoma. Further evaluation with PET-CT is recommended in the near future for staging purposes. 4. Cholelithiasis without evidence of acute cholecystitis at this time. 5. Colonic diverticulosis without evidence of acute diverticulitis at this time. 6. Aortic atherosclerosis, in addition to left main and 3 vessel coronary artery disease. Status post median sternotomy for CABG including LIMA to the LAD. 7. Additional incidental findings, as above. These results will be called to the ordering clinician or representative by the Radiologist Assistant, and communication documented in the PACS or zVision Dashboard. Electronically Signed   By: Vinnie Langton M.D.   On: 01/09/2018 16:47   Disposition   Pt is being discharged home today in good condition.  Follow-up Plans & Appointments    Follow-up Information    Eileen Stanford, PA-C. Go on 02/20/2018.   Specialties:  Cardiology, Radiology Why:  @ 1:30 pm. please arrive at least 10 minutes early  Contact information: [place] [address] [phone]          Discharge Instructions    Amb Referral to Cardiac Rehabilitation   Complete by:  As directed    Diagnosis:  Valve Replacement   Valve:  Aortic      Discharge Medications   Allergies as of  02/06/2018   No Known Allergies     Medication List    STOP taking these medications   losartan 25 MG tablet Commonly known as:  COZAAR     TAKE these medications   aspirin EC 81 MG tablet Take 81 mg by mouth every evening.   clopidogrel 75 MG tablet Commonly known as:  PLAVIX Take 1 tablet (75 mg total) by mouth daily with breakfast. Start taking  on:  02/07/2018   ferrous sulfate 325 (65 FE) MG tablet Take 325 mg by mouth daily with breakfast.   furosemide 20 MG tablet Commonly known as:  LASIX Take 20 mg by mouth daily.   insulin detemir 100 UNIT/ML injection Commonly known as:  LEVEMIR Inject 31 Units into the skin daily.   lovastatin 40 MG tablet Commonly known as:  MEVACOR Take 40 mg by mouth at bedtime.   Melatonin 5 MG Tabs Take 5 mg by mouth at bedtime as needed (for sleep).   metoprolol succinate 25 MG 24 hr tablet Commonly known as:  TOPROL-XL Take 25 mg by mouth 2 (two) times daily.   multivitamin tablet Take 1 tablet by mouth daily.   tamsulosin 0.4 MG Caps capsule Commonly known as:  FLOMAX Take 1 capsule (0.4 mg total) by mouth daily.           Outstanding Labs/Studies   none  Duration of Discharge Encounter   Greater than 30 minutes including physician time.  Signed, Angelena Form, PA-C 02/06/2018, 11:24 AM [phone]  I have personally seen and examined this patient. I agree with the assessment and plan as outlined above.  He is doing well this am. No dyspnea or chest pain. BP stable. Sinus on tele. Groins stable. Echo with trivial PVL. Discharge home today.   Lauree Chandler 02/06/2018 5:12 PM

## 2018-02-06 NOTE — Progress Notes (Signed)
IV and telemetry discontinued at this time. CCMD notified. Discharge instructions reviewed with patient. All questions answered.   Emelda Fear, RN

## 2018-02-06 NOTE — Progress Notes (Signed)
  Echocardiogram 2D Echocardiogram has been performed.  Austin Lloyd M 02/06/2018, 8:25 AM

## 2018-02-06 NOTE — Progress Notes (Signed)
CVC line removed at this time. Patient tolerated well. Vaseline gauze and gauze applied with tegaderm. Will continue to monitor.

## 2018-02-06 NOTE — Progress Notes (Signed)
Spoke with Estill Bamberg and made her aware of the DC central line order, RN Lorenza Chick will be notified as well.

## 2018-02-06 NOTE — Care Management Note (Signed)
Case Management Note  Patient Details  Name: Austin Lloyd MRN: [id number] Date of Birth: June 23, 1929  Subjective/Objective:    S/p TAVR.                 Action/Plan: PTA home with spouse. No therapy needs. No DME needs.  Expected Discharge Date:  02/06/18               Expected Discharge Plan:  Home/Self Care  In-House Referral:  NA  Discharge planning Services  CM Consult  Post Acute Care Choice:  NA Choice offered to:  NA  DME Arranged:  N/A DME Agency:  NA  HH Arranged:  NA HH Agency:  NA  Status of Service:  Completed, signed off  If discussed at Am[place] of Stay Meetings, dates discussed:    Additional Comments:  Bartholomew Crews, RN 02/06/2018, 12:15 PM

## 2018-02-07 ENCOUNTER — Telehealth: Payer: Self-pay | Admitting: Physician Assistant

## 2018-02-07 ENCOUNTER — Telehealth (HOSPITAL_COMMUNITY): Payer: Self-pay

## 2018-02-07 ENCOUNTER — Telehealth: Payer: Self-pay

## 2018-02-07 MED FILL — Heparin Sodium (Porcine) Inj 1000 Unit/ML: INTRAMUSCULAR | Qty: 30 | Status: AC

## 2018-02-07 MED FILL — Potassium Chloride Inj 2 mEq/ML: INTRAVENOUS | Qty: 40 | Status: AC

## 2018-02-07 MED FILL — Magnesium Sulfate Inj 50%: INTRAMUSCULAR | Qty: 10 | Status: AC

## 2018-02-07 NOTE — Telephone Encounter (Signed)
Patient states he does not need Hospital follow up with PCP. Has appointments with other spec.

## 2018-02-07 NOTE — Telephone Encounter (Signed)
  [place] VALVE TEAM   Patient contacted regarding discharge from [facility] on 02/06/18  Patient understands to follow up with provider Nell Range on 12/4 @ 1:30pm at [facility].  Patient understands discharge instructions? yes Patient understands medications and regiment? yes Patient understands to bring all medications to this visit? yes  Angelena Form PA-C  [facility]

## 2018-02-07 NOTE — Telephone Encounter (Signed)
New message   Per patient wants to know if he can use the incentive pro meter? Patient wants to know if he may need a new one. Please call to discuss.

## 2018-02-07 NOTE — Telephone Encounter (Signed)
Left message on machine for pt to contact the office.   

## 2018-02-07 NOTE — Telephone Encounter (Signed)
Pt insurance is active and benefits verified through [employer] A/[employer]. Co-pay $0.00, DED $185.00/$185.00 met, out of pocket $0.00/$0.00 met, co-insurance 20%. No pre-authorization required. Passport, 02/07/18 @ 10:48AM , REF# [id number]  2ndary insurance is active and benefits verified through [facility]. Co-pay $0.00, DED $0.00/$0.00 met, out of pocket $0.00/$0.00 met, co-insurance 0%. No pre-authorization required. Passport, 02/07/18, ref# [id number]  Will contact patient to see if he is interested in the Cardiac Rehab Program. If interested, patient will need to complete follow up appt. Once completed, patient will be contacted for scheduling upon review by the RN Navigator.

## 2018-02-07 NOTE — Telephone Encounter (Signed)
I spoke with the pt and he has an incentive spirometer at home that is a number of years old that he kept after a previous surgery.  The pt wondered if he should use this since he had TAVR.  I advised the pt that this is not needed following TAVR.  I also advised the pt that it may be best to discard of incentive spirometer due to risk of bacteria inside of device.  Pt agreed.

## 2018-02-07 NOTE — Telephone Encounter (Signed)
Attempted to call patient in regards to Cardiac Rehab - LM on VM 

## 2018-02-19 NOTE — Progress Notes (Signed)
[facility]                                       Cardiology Office Note    Date:  02/20/2018   ID:  Austin Lloyd, DOB 01-Jul-1929, MRN [id number]  PCP:  Shelda Pal, DO  Cardiologist: [name] / [name] & Dr. Cyndia Bent (TAVR)  CC: [facility] s/p TAVR  History of Present Illness:  Austin Lloyd is a 84 y.o. male with a history of bladder cancer,lung cancer, prostate cancer,CLL,, CAD s/p 2V CABG in 2016 ([place]), diabetes, HTN, ischemic cardiomyopathy, CKD stage III, PVCs and severe aortic stenosis s/p TAVR (02/05/18) who presents to clinic for follow up.   The patient lived in [place] prior to moving here about 3 months ago to live with his daughter. He was diagnosed with CLL in 2012 and was treated with chemotherapy. 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 The pathology showed stage Ib with visceral pleural invasion but negative margins and no lymph node involvement. Patient subsequently underwent coronary bypass graft surgery in 2016 with a LIMA to the LAD and a saphenous vein graft to the right coronary artery. He has reportedly had ischemic cardiomyopathy with low ejection fraction in the past. He was found to have a recurrence of lung cancer in February 2018 that was diagnosed by bronchoscopy. He underwent treatment with stereotactic body radiotherapy to the lesion. He then reportedly had a new right lower lobe nodule show up in 01/2017 that was initially concerning for recurrence but then resolved spontaneously. Since he moved to [facility] he has been seen by [name] with oncology and plans were made for a follow-up chest CT scan in early 2020. He had known aortic stenosis and so that he was told by his cardiologist in [place] that it appeared to be getting worse. He was seen by [name]  in August 2019 and underwent a follow-up echocardiogram on 11/06/2017 which showed a trileaflet severely thickened and calcified aortic valve with severely restricted leaflet mobility. The mean gradient was 26 mmHg and a peak gradient 48 mmHg. Dimensionless index was 0.2 with a valve area of 0.89 cm. Left ventricular ejection fraction was 40 to 45% with grade 2 diastolic dysfunction and left ventricular dilatation. Unfortunately his CTA of the chest done as part of his work-up showed a 5.2 x 2.6 x 3.2 cm right perihilar mass thatwas concerning fora recurrent lung cancer given his prior history of right upper lobectomy for lung cancer and subsequent recurrence a year ago treated with radiation therapy. He also has a small right pleural effusion which could be malignant. There is no significant lymphadenopathy within the mediastinum and no signs of solid organ or bone involvement. A PET scan was done which showed moderate metabolic activity only along the lateral margin of this perihilar mass that was much less than expected for a recurrent bronchogenic carcinoma. We discussed his case and reviewed the films at the multidisciplinary thoracic oncology meeting and it was felt that this may be inflammatory due to his prior history of XRT to this region. Given the severity of his aortic stenosis it was felt by Dr. Julien Nordmann that we should proceed with TAVR and plan to followup the hilar mass with a repeat CT  in a few months.  He underwent successful TAVR with a 26 mm Edwards Sapien 3 THV via the TF approach on 02/05/18. Post operative echo showed EF 40-45%, normally functioning TAVR with trivial PVL; mean gradient 8 mm Hg. He was discharged home on ASA and plavix. Losartan 12.5mg  daily was held at discharge. However, it turns out that he hasn't been taking this at all.   Today he presents to clinic for follow up. Still getting some shortness of breath when doing moderate exertion activities like packing clothes. He  can tell a small difference in his energy levels. He gets tired out after a shopping trip for example. He is a little disappointed because he thought he would be feeling better than he does after TAVR. No CP. No LE edema, orthopnea or PND. No dizziness or syncope. No blood in stool or urine. No palpitations.    Past Medical History:  Diagnosis Date  . Aortic stenosis   . Bladder cancer ([place])    T1 status post BCG  . 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 Coronary artery disease with history of myocardial infarction without history of CABG    LIMA to the LAD, SVG to RCA 2016 for treatment of the percent left main stenosis and 80% RCA stenosis.    . Depression   . Diabetes mellitus, type II ([place])   . Hypertension   . Ischemic cardiomyopathy    EF 40% 2019  . Lung cancer ([place])    Adenocarcinoma of the right lung moderately differentiated  . Lung mass   . PVC's (premature ventricular contractions)   . S/P TAVR (transcatheter aortic valve replacement)     Past Surgical History:  Procedure Laterality Date  . CORONARY ARTERY BYPASS GRAFT     2014  . INGUINAL HERNIA REPAIR    . INTRAOPERATIVE TRANSTHORACIC ECHOCARDIOGRAM  02/05/2018   Procedure: INTRAOPERATIVE TRANSTHORACIC ECHOCARDIOGRAM;  Surgeon: [name], MD;  Location: [facility];  Service: Open Heart Surgery;;  . LUNG REMOVAL, PARTIAL    . RIGHT/LEFT HEART CATH AND CORONARY/GRAFT ANGIOGRAPHY N/A 12/21/2017   Procedure: RIGHT/LEFT HEART CATH AND CORONARY/GRAFT ANGIOGRAPHY;  Surgeon: [name], MD;  Location: [facility];  Service: Cardiovascular;  Laterality: N/A;  . TRANSCATHETER AORTIC VALVE REPLACEMENT, TRANSFEMORAL  02/05/2018   TRANSCATHETER AORTIC VALVE REPLACEMENT, TRANSFEMORAL (  . TRANSCATHETER AORTIC VALVE REPLACEMENT, TRANSFEMORAL N/A 02/05/2018   Procedure: TRANSCATHETER AORTIC VALVE REPLACEMENT, TRANSFEMORAL;  Surgeon:  [name], MD;  Location: [facility];  Service: Open Heart Surgery;  Laterality: N/A;  . Ulcer surgery      Current Medications: Outpatient Medications Prior to Visit  Medication Sig Dispense Refill  . aspirin EC 81 MG tablet Take 81 mg by mouth every evening.     . clopidogrel (PLAVIX) 75 MG tablet Take 1 tablet (75 mg total) by mouth daily with breakfast. 90 tablet 1  . ferrous sulfate 325 (65 FE) MG tablet Take 325 mg by mouth daily with breakfast.    . furosemide (LASIX) 20 MG tablet Take 20 mg by mouth daily.    . insulin detemir (LEVEMIR) 100 UNIT/ML injection Inject 31 Units into the skin daily.     Marland Kitchen lovastatin (MEVACOR) 40 MG tablet Take 40 mg by mouth at bedtime.    . Melatonin 5 MG TABS Take 5 mg by mouth at bedtime as needed (for sleep).     . metoprolol succinate (TOPROL-XL) 25 MG  24 hr tablet Take 12.5 mg by mouth daily.    . Multiple Vitamin (MULTIVITAMIN) tablet Take 1 tablet by mouth daily.    . tamsulosin (FLOMAX) 0.4 MG CAPS capsule Take 1 capsule (0.4 mg total) by mouth daily. 90 capsule 3  . metoprolol succinate (TOPROL-XL) 25 MG 24 hr tablet Take 25 mg by mouth 2 (two) times daily.     No facility-administered medications prior to visit.      Allergies:   Patient has no known allergies.   Social History   Socioeconomic History  . Marital status: Married    Spouse name: Not on file  . Number of children: 3  . Years of education: Not on file  . Highest education level: Not on file  Occupational History  . 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 Financial resource strain: Not on file  . Food insecurity:    Worry: Not on file    Inability: Not on file  . Transportation needs:    Medical: Not on file    Non-medical: Not on file  Tobacco Use  . Smoking status: Former Smoker    Packs/day: 0.20    Years: 30.00    Pack years: 6.00    Types: Cigarettes  . Smokeless tobacco: Never Used  Substance and Sexual  Activity  . Alcohol use: Yes    Alcohol/week: 2.0 standard drinks    Types: 2 Cans of beer per week    Comment: 2 cans beer per week.  . Drug use: Never  . Sexual activity: Not on file  Lifestyle  . Physical activity:    Days per week: Not on file    Minutes per session: Not on file  . Stress: Not on file  Relationships  . Social connections:    Talks on phone: Not on file    Gets together: Not on file    Attends religious service: Not on file    Active member of club or organization: Not on file    Attends meetings of clubs or organizations: Not on file    Relationship status: Not on file  Other Topics Concern  . Not on file  Social History Narrative   Lives with wife daughter.  Three children and one grandchild.      Family History:  The patient's family history includes AAA (abdominal aortic aneurysm) in his mother; Cancer in his father.      ROS:   Please see the history of present illness.    ROS All other systems reviewed and are negative.   PHYSICAL EXAM:   VS:  BP (!) 142/82   Pulse 82   Ht 5\' 4"  (1.626 m)   Wt 147 lb 12.8 oz (67 kg)   SpO2 100%   BMI 25.37 kg/m    GEN: Well nourished, well developed, in no acute distress HEENT: normal Neck: no JVD or masses Cardiac: RRR; no murmurs, rubs, or gallops,no edema  Respiratory:  clear to auscultation bilaterally, normal work of breathing GI: soft, nontender, nondistended, + BS MS: no deformity or atrophy Skin: warm and dry, no rash Neuro:  Alert and Oriented x 3, Strength and sensation are intact Psych: euthymic mood, full affect   Wt Readings from Last 3 Encounters:  02/20/18 147 lb 12.8 oz (67 kg)  02/06/18 151 lb 7.3 oz (68.7 kg)  02/04/18 152 lb 12.8 oz (69.3 kg)      Studies/Labs Reviewed:   EKG:  EKG is ordered today.  The ekg ordered today demonstrates NSR, HR 82  Recent Labs: 11/06/2017: TSH 2.500 02/01/2018: ALT 23; B Natriuretic Peptide 447.9 02/06/2018: BUN 31; Creatinine, Ser 1.48;  Hemoglobin 11.1; Magnesium 1.8; Platelets 149; Potassium 4.5; Sodium 136   Lipid Panel    Component Value Date/Time   CHOL 183 11/06/2017 0815   TRIG 86 11/06/2017 0815   HDL 58 11/06/2017 0815   CHOLHDL 3.2 11/06/2017 0815   LDLCALC 108 (H) 11/06/2017 0815    Additional studies/ records that were reviewed today include:  TAVR OPERATIVE NOTE   Date of Procedure:02/05/2018  Preoperative Diagnosis:Severe Aortic Stenosis   Postoperative Diagnosis:Same   Procedure:   Transcatheter Aortic Valve Replacement - PercutaneousLeftTransfemoral Approach Edwards Sapien 3 THV (size 61mm, model # 9600TFX, serial # [id number])  Co-Surgeons:Bryan Alveria Apley, MD and Lauree Chandler, MD  Pre-operative Echo Findings: ? Severe aortic stenosis ? mildleft ventricular systolic dysfunction  Post-operative Echo Findings: ? Noparavalvular leak ? mildleft ventricular systolic dysfunction  _____________   Echo 02/06/18:  Study Conclusions - Left ventricle: The cavity size was mildly dilated. Wall thickness was increased in a pattern of mild LVH. Systolic function was mildly to moderately reduced. The estimated ejection fraction was in the range of 40% to 45%. The study is not technically sufficient to allow evaluation of LV diastolic function. - Aortic valve: Post TAVR with 26 mm Sapien 3 valve. Trivial peri valvular regurgitation. Valve area (VTI): 1.2 cm^2. Valve area (Vmax): 1.05 cm^2. Valve area (Vmean): 1.25 cm^2. - Mitral valve: Calcified annulus. There was mild regurgitation. - Left atrium: The atrium was mildly dilated. - Atrial septum: No defect or patent foramen ovale was identified.   ASSESSMENT & PLAN:   Severe AS s/p TAVR:doing well. A little disappointed because he thought he would be breathing a little better than he is, but he can tell a slight improvement in energy levels  since TAVR. Groin sites healing well. Continue ASA and plavix. SBE prophylaxis dicussed; he has dentures and does not visit the dentist.   Chronic combined S/D CHF: appears euvolemic. Continue Toprol XL 12.5mg  daily (he cannot tolerate higher doses).   CKD stage III: baseline creat 1-4-1.48.   HTN: BP with moderate control today. No changes made   History of lung cancer: pre TAVR CT scan showed a perihilar mass that is being followed closely by oncology. He has a repeat CT scan scheduled for early Jan.  CAD s/p CABG: pre TAVR cath showed patent bypass grafts. Continue medical therapy     Medication Adjustments/Labs and Tests Ordered: Current medicines are reviewed at length with the patient today.  Concerns regarding medicines are outlined above.  Medication changes, Labs and Tests ordered today are listed in the Patient Instructions below. Patient Instructions  Medication Instructions:   Your physician recommends that you continue on your current medications as directed. Please refer to the Current Medication list given to you today.  If you need a refill on your cardiac medications before your next appointment, please call your pharmacy.      Testing/Procedures:  ECHOCARDIOGRAM AS SCHEDULED FOR Thursday 03/07/18 AT 1 PM AT OUR OFFICE     Follow-Up:  WITH Evalyse Stroope PA-C AS SCHEDULED FOR Thursday 03/07/18 AT 2 PM      Signed, Angelena Form, PA-C  02/20/2018 5:09 PM    [facility] [address], [place], [place]  [address] Phone: [phone]; Fax: [phone]

## 2018-02-20 ENCOUNTER — Encounter: Payer: Self-pay | Admitting: Thoracic Surgery (Cardiothoracic Vascular Surgery)

## 2018-02-20 ENCOUNTER — Ambulatory Visit (INDEPENDENT_AMBULATORY_CARE_PROVIDER_SITE_OTHER): Payer: Medicare Other | Admitting: Physician Assistant

## 2018-02-20 ENCOUNTER — Encounter: Payer: Self-pay | Admitting: Physician Assistant

## 2018-02-20 VITALS — BP 142/82 | HR 82 | Ht 64.0 in | Wt 147.8 lb

## 2018-02-20 DIAGNOSIS — Z952 Presence of prosthetic heart valve: Secondary | ICD-10-CM | POA: Diagnosis not present

## 2018-02-20 DIAGNOSIS — N183 Chronic kidney disease, stage 3 unspecified: Secondary | ICD-10-CM

## 2018-02-20 DIAGNOSIS — I2510 Atherosclerotic heart disease of native coronary artery without angina pectoris: Secondary | ICD-10-CM | POA: Diagnosis not present

## 2018-02-20 DIAGNOSIS — I10 Essential (primary) hypertension: Secondary | ICD-10-CM

## 2018-02-20 DIAGNOSIS — Z85118 Personal history of other malignant neoplasm of bronchus and lung: Secondary | ICD-10-CM | POA: Diagnosis not present

## 2018-02-20 DIAGNOSIS — I5042 Chronic combined systolic (congestive) and diastolic (congestive) heart failure: Secondary | ICD-10-CM

## 2018-02-20 NOTE — Patient Instructions (Addendum)
Medication Instructions:   Your physician recommends that you continue on your current medications as directed. Please refer to the Current Medication list given to you today.  If you need a refill on your cardiac medications before your next appointment, please call your pharmacy.      Testing/Procedures:  ECHOCARDIOGRAM AS SCHEDULED FOR Thursday 03/07/18 AT 1 PM AT OUR OFFICE     Follow-Up:  WITH KATHRYN THOMPSON PA-C AS SCHEDULED FOR Thursday 03/07/18 AT 2 PM

## 2018-02-22 NOTE — Telephone Encounter (Signed)
Attempted to call patient in regards to Cardiac Rehab - LM on VM 

## 2018-03-01 DIAGNOSIS — C4441 Basal cell carcinoma of skin of scalp and neck: Secondary | ICD-10-CM | POA: Diagnosis not present

## 2018-03-01 DIAGNOSIS — Z85828 Personal history of other malignant neoplasm of skin: Secondary | ICD-10-CM | POA: Diagnosis not present

## 2018-03-05 NOTE — Telephone Encounter (Signed)
Called patient to see if he was interested in participating in the Cardiac Rehab Program. Patient stated yes. Patient will come in for orientation on 04/18/2018 @ 1:30PM and will attend the 11:15AM exercise class.  Mailed homework package.  Went over insurance, patient verbalized understanding.

## 2018-03-06 NOTE — Progress Notes (Signed)
[facility]   MULTIDISCIPLINARY HEART VALVE TEAM                                       Cardiology Office Note    Date:  03/08/2018   ID:  Austin Lloyd, DOB [birth year], MRN [id number]  PCP:  Shelda Pal, DO  Cardiologist: [name] / [name] & Dr. Cyndia Bent (TAVR)  CC: 1 month s/p TAVR  History of Present Illness:  Austin Lloyd is a 84 y.o. male with a history of bladder cancer,lung cancer s/p RUL lobectomy, prostate cancer,CLL, CAD s/p 2V CABG in 2016 ([place]), diabetes, HTN, ischemic cardiomyopathy, CKD stage III, PVCs and severe aortic stenosis s/p TAVR (02/05/18) who presents to clinic for follow up.   The patient lived in [place] prior to moving here about 3 months ago to live with his daughter. He was diagnosed with CLL in 2012 and was treated with chemotherapy. 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 The pathology showed stage Ib with visceral pleural invasion but negative margins and no lymph node involvement. Patient subsequently underwent coronary bypass graft surgery in 2016 with a LIMA to the LAD and a saphenous vein graft to the right coronary artery. He has reportedly had ischemic cardiomyopathy with low ejection fraction in the past. He was found to have a recurrence of lung cancer in February 2018 that was diagnosed by bronchoscopy. He underwent treatment with stereotactic body radiotherapy to the lesion. He then reportedly had a new right lower lobe nodule show up in 01/2017 that was initially concerning for recurrence but then resolved spontaneously. Since he moved to [facility] he has been seen by [name] with oncology and plans were made for a follow-up chest CT scan in early 2020. He had known aortic stenosis and so that he was told by his cardiologist in [place] that it appeared to be getting worse. He was  seen by [name] in August 2019 and underwent a follow-up echocardiogram on 11/06/2017 which showed a trileaflet severely thickened and calcified aortic valve with severely restricted leaflet mobility. The mean gradient was 26 mmHg and a peak gradient 48 mmHg. Dimensionless index was 0.2 with a valve area of 0.89 cm. Left ventricular ejection fraction was 40 to 45% with grade 2 diastolic dysfunction and left ventricular dilatation. Unfortunately his CTA of the chest done as part of his work-up showed a 5.2 x 2.6 x 3.2 cm right perihilar mass thatwas concerning fora recurrent lung cancer given his prior history of right upper lobectomy for lung cancer and subsequent recurrence a year ago treated with radiation therapy. He also has a small right pleural effusion which could be malignant. There is no significant lymphadenopathy within the mediastinum and no signs of solid organ or bone involvement. A PET scan was done which showed moderate metabolic activity only along the lateral margin of this perihilar mass that was much less than expected for a recurrent bronchogenic carcinoma. We discussed his case and reviewed the films at the multidisciplinary thoracic oncology meeting and it was felt that this may be inflammatory due to his prior history of XRT to this region. Given the severity of his aortic stenosis it was felt by Dr. Julien Nordmann that we should proceed with TAVR and plan to followup the hilar mass  with a repeat CT in a few months.  He underwent successful TAVR with a 26 mm Edwards Sapien 3 THV via the TF approach on 02/05/18. Post operative echo showed EF 40-45%, normally functioning TAVR with trivial PVL; mean gradient 8 mm Hg. He was discharged home on ASA and plavix. Losartan 12.5mg  daily was held at discharge. However, it turns out that he hasn't been taking this at all.   He has done well in follow up but was a little disappointed as he thought he would be feeling better s/p TAVR. Today he  presents to clinic for follow up. He has no appetite, feels lousy and still very short of breath. He also has a cough that is worse when lying in bed at night. He is convinced it is related to the Plavix because the coughing started after he was discharged and he notices that its worse after taking the medication. He gets short of breath with minimal exertion. No LE edema or PND. No dizziness or syncope. No chest pain.    Past Medical History:  Diagnosis Date  . Aortic stenosis   . Bladder cancer ([place])    T1 status post BCG  . 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 Coronary artery disease with history of myocardial infarction without history of CABG    LIMA to the LAD, SVG to RCA 2016 for treatment of the percent left main stenosis and 80% RCA stenosis.    . Depression   . Diabetes mellitus, type II ([place])   . Hypertension   . Ischemic cardiomyopathy    EF 40% 2019  . Lung cancer ([place])    Adenocarcinoma of the right lung moderately differentiated  . Lung mass   . PVC's (premature ventricular contractions)   . S/P TAVR (transcatheter aortic valve replacement)     Past Surgical History:  Procedure Laterality Date  . CORONARY ARTERY BYPASS GRAFT     2014  . INGUINAL HERNIA REPAIR    . INTRAOPERATIVE TRANSTHORACIC ECHOCARDIOGRAM  02/05/2018   Procedure: INTRAOPERATIVE TRANSTHORACIC ECHOCARDIOGRAM;  Surgeon: [name], MD;  Location: [facility] OR;  Service: Open Heart Surgery;;  . LUNG REMOVAL, PARTIAL    . RIGHT/LEFT HEART CATH AND CORONARY/GRAFT ANGIOGRAPHY N/A 12/21/2017   Procedure: RIGHT/LEFT HEART CATH AND CORONARY/GRAFT ANGIOGRAPHY;  Surgeon: [name], MD;  Location: [facility];  Service: Cardiovascular;  Laterality: N/A;  . TRANSCATHETER AORTIC VALVE REPLACEMENT, TRANSFEMORAL  02/05/2018   TRANSCATHETER AORTIC VALVE REPLACEMENT, TRANSFEMORAL (  . TRANSCATHETER AORTIC VALVE  REPLACEMENT, TRANSFEMORAL N/A 02/05/2018   Procedure: TRANSCATHETER AORTIC VALVE REPLACEMENT, TRANSFEMORAL;  Surgeon: [name], MD;  Location: [facility];  Service: Open Heart Surgery;  Laterality: N/A;  . Ulcer surgery      Current Medications: Outpatient Medications Prior to Visit  Medication Sig Dispense Refill  . aspirin EC 81 MG tablet Take 81 mg by mouth every evening.     . clopidogrel (PLAVIX) 75 MG tablet Take 1 tablet (75 mg total) by mouth daily with breakfast. 90 tablet 1  . ferrous sulfate 325 (65 FE) MG tablet Take 325 mg by mouth daily with breakfast.    . insulin detemir (LEVEMIR) 100 UNIT/ML injection Inject 31 Units into the skin daily.     Marland Kitchen lovastatin (MEVACOR) 40 MG tablet Take 40 mg by mouth at bedtime.    . Melatonin 5 MG TABS Take 5 mg by mouth at bedtime as needed (  for sleep).     . metoprolol succinate (TOPROL-XL) 25 MG 24 hr tablet Take 12.5 mg by mouth daily.    . Multiple Vitamin (MULTIVITAMIN) tablet Take 1 tablet by mouth daily.    . tamsulosin (FLOMAX) 0.4 MG CAPS capsule Take 1 capsule (0.4 mg total) by mouth daily. 90 capsule 3  . furosemide (LASIX) 20 MG tablet Take 20 mg by mouth daily.     No facility-administered medications prior to visit.      Allergies:   Patient has no known allergies.   Social History   Socioeconomic History  . Marital status: Married    Spouse name: Not on file  . Number of children: 3  . Years of education: Not on file  . Highest education level: Not on file  Occupational History  . 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 Financial resource strain: Not on file  . Food insecurity:    Worry: Not on file    Inability: Not on file  . Transportation needs:    Medical: Not on file    Non-medical: Not on file  Tobacco Use  . Smoking status: Former Smoker    Packs/day: 0.20    Years: 30.00    Pack years: 6.00    Types: Cigarettes  . Smokeless tobacco: Never Used    Substance and Sexual Activity  . Alcohol use: Yes    Alcohol/week: 2.0 standard drinks    Types: 2 Cans of beer per week    Comment: 2 cans beer per week.  . Drug use: Never  . Sexual activity: Not on file  Lifestyle  . Physical activity:    Days per week: Not on file    Minutes per session: Not on file  . Stress: Not on file  Relationships  . Social connections:    Talks on phone: Not on file    Gets together: Not on file    Attends religious service: Not on file    Active member of club or organization: Not on file    Attends meetings of clubs or organizations: Not on file    Relationship status: Not on file  Other Topics Concern  . Not on file  Social History Narrative   Lives with wife daughter.  Three children and one grandchild.      Family History:  The patient's family history includes AAA (abdominal aortic aneurysm) in his mother; Cancer in his father.     ROS:   Please see the history of present illness.    ROS All other systems reviewed and are negative.   PHYSICAL EXAM:   VS:  BP 134/72   Pulse (!) 52   Ht 5\' 4"  (1.626 m)   Wt 150 lb 12.8 oz (68.4 kg)   SpO2 92%   BMI 25.88 kg/m    GEN: Well nourished, well developed, in no acute distress HEENT: normal Neck: no JVD or masses Cardiac: RRR; 2/6 SEM. No rubs, or gallops,no edema  Respiratory:  clear to auscultation bilaterally, normal work of breathing GI: soft, nontender, nondistended, + BS MS: no deformity or atrophy Skin: warm and dry, no rash Neuro:  Alert and Oriented x 3, Strength and sensation are intact Psych: euthymic mood, full affect     Wt Readings from Last 3 Encounters:  03/07/18 150 lb 12.8 oz (68.4 kg)  02/20/18 147 lb 12.8 oz (67 kg)  02/06/18 151 lb 7.3 oz (68.7 kg)  Studies/Labs Reviewed:   EKG:  EKG is NOT ordered today.   Recent Labs: 11/06/2017: TSH 2.500 02/01/2018: ALT 23; B Natriuretic Peptide 447.9 02/06/2018: Hemoglobin 11.1; Magnesium 1.8; Platelets  149 03/07/2018: BUN 29; Creatinine, Ser 1.45; NT-Pro BNP 4,654; Potassium 4.5; Sodium 142   Lipid Panel    Component Value Date/Time   CHOL 183 11/06/2017 0815   TRIG 86 11/06/2017 0815   HDL 58 11/06/2017 0815   CHOLHDL 3.2 11/06/2017 0815   LDLCALC 108 (H) 11/06/2017 0815    Additional studies/ records that were reviewed today include:  TAVR OPERATIVE NOTE   Date of Procedure:02/05/2018  Preoperative Diagnosis:Severe Aortic Stenosis   Postoperative Diagnosis:Same   Procedure:   Transcatheter Aortic Valve Replacement - PercutaneousLeftTransfemoral Approach Edwards Sapien 3 THV (size 38mm, model # 9600TFX, serial # [id number])  Co-Surgeons:Bryan Alveria Apley, MD and Lauree Chandler, MD  Pre-operative Echo Findings: ? Severe aortic stenosis ? mildleft ventricular systolic dysfunction  Post-operative Echo Findings: ? Noparavalvular leak ? mildleft ventricular systolic dysfunction  _____________   Echo 02/06/18:  Study Conclusions - Left ventricle: The cavity size was mildly dilated. Wall thickness was increased in a pattern of mild LVH. Systolic function was mildly to moderately reduced. The estimated ejection fraction was in the range of 40% to 45%. The study is not technically sufficient to allow evaluation of LV diastolic function. - Aortic valve: Post TAVR with 26 mm Sapien 3 valve. Trivial peri valvular regurgitation. Valve area (VTI): 1.2 cm^2. Valve area (Vmax): 1.05 cm^2. Valve area (Vmean): 1.25 cm^2. - Mitral valve: Calcified annulus. There was mild regurgitation. - Left atrium: The atrium was mildly dilated. - Atrial septum: No defect or patent foramen ovale was identified.   _________________  Echo 03/07/18 Study Conclusions  - Left ventricle: The cavity size was mildly dilated. Wall   thickness was normal. Systolic function was severely reduced.  The   estimated ejection fraction was in the range of 25% to 30%. There   is akinesis of the inferolateral and inferior myocardium. Doppler   parameters are consistent with abnormal left ventricular   relaxation (grade 1 diastolic dysfunction). Doppler parameters   are consistent with high ventricular filling pressure. - Aortic valve: A bioprosthesis was present. There was trivial   regurgitation. - Mitral valve: Calcified annulus. Mildly thickened leaflets .   There was mild regurgitation. - Left atrium: The atrium was moderately dilated. Impressions: - Global hypokinesis with akinesis of the inferior and   inferolateral walls with overall severe LV dysfunction; mild   diastolic dysfunction; mild LVE; s/p AVR with trace AI; AVA 1.1   cm2; mild MR; moderate LAE.   ASSESSMENT & PLAN:   Severe AS s/p TAVR: echo today shows EF 25-30% (down from 40-45%) normally functioning TAVR with mean gradient 10 mm Hg with trivial PVL. He has NYHA class III symptoms of shortness of breath. Likely multifactorial from cardiac and pulmonary disease. SBE prophylaxis discussed. Plavix can be discontinued after 6 months of therapy.   Acute on chronic combined S/D CHF: echo today shows EF 25-30% (down from 40-45%).  He does not appear volume overloaded but having symptoms of worsening shortness of breath and supine cough. Increase lasix from 20mg  to 40mg  daily. Check BMET and BNP today. Continue Toprol XL 25mg  daily.   CKD stage III: baseline creat 1-4-1.48. BMET today  HTN: BP well controlled.   History of lung cancer: pre TAVR CT scan showed a perihilar mass that is being followed closely by oncology. He has  a repeat CT scan scheduled for early Jan.  CAD s/p CABG: pre TAVR cath showed patent bypass grafts. Continue medical therapy    Medication Adjustments/Labs and Tests Ordered: Current medicines are reviewed at length with the patient today.  Concerns regarding medicines are outlined above.   Medication changes, Labs and Tests ordered today are listed in the Patient Instructions below. Patient Instructions  Medication Instructions:  1) INCREASE LASIX to 40 mg daily  Labwork: TODAY: BMET, BNP  ON Monday, 12/23: BMET. Come to our office, you do not need to be fasting. You may come any time between 7:30AM and 5:00PM. Make sure to check in at the front desk!  Testing/Procedures: No new orders  Follow-Up: Your provider recommends that you schedule a follow-up appointment in 3 months with [name].  We will call you to arrange your 1 year TAVR echocardiogram and office visit.    Signed, Angelena Form, PA-C  03/08/2018 8:42 AM    [facility] [address], [place], [place]  [address] Phone: [phone]; Fax: [phone]

## 2018-03-07 ENCOUNTER — Ambulatory Visit (INDEPENDENT_AMBULATORY_CARE_PROVIDER_SITE_OTHER): Payer: Medicare Other | Admitting: Physician Assistant

## 2018-03-07 ENCOUNTER — Encounter: Payer: Self-pay | Admitting: Physician Assistant

## 2018-03-07 ENCOUNTER — Ambulatory Visit (HOSPITAL_COMMUNITY): Payer: Medicare Other | Attending: Cardiology

## 2018-03-07 ENCOUNTER — Other Ambulatory Visit: Payer: Self-pay

## 2018-03-07 VITALS — BP 134/72 | HR 52 | Ht 64.0 in | Wt 150.8 lb

## 2018-03-07 DIAGNOSIS — I10 Essential (primary) hypertension: Secondary | ICD-10-CM | POA: Diagnosis not present

## 2018-03-07 DIAGNOSIS — Z952 Presence of prosthetic heart valve: Secondary | ICD-10-CM

## 2018-03-07 DIAGNOSIS — I5042 Chronic combined systolic (congestive) and diastolic (congestive) heart failure: Secondary | ICD-10-CM | POA: Diagnosis not present

## 2018-03-07 DIAGNOSIS — I255 Ischemic cardiomyopathy: Secondary | ICD-10-CM | POA: Diagnosis not present

## 2018-03-07 DIAGNOSIS — N183 Chronic kidney disease, stage 3 unspecified: Secondary | ICD-10-CM

## 2018-03-07 DIAGNOSIS — Z85118 Personal history of other malignant neoplasm of bronchus and lung: Secondary | ICD-10-CM | POA: Diagnosis not present

## 2018-03-07 DIAGNOSIS — I2510 Atherosclerotic heart disease of native coronary artery without angina pectoris: Secondary | ICD-10-CM

## 2018-03-07 MED ORDER — FUROSEMIDE 40 MG PO TABS: 40.0000 mg | ORAL_TABLET | Freq: Every day | ORAL | 3 refills | Status: DC

## 2018-03-07 NOTE — Patient Instructions (Addendum)
Medication Instructions:  1) INCREASE LASIX to 40 mg daily  Labwork: TODAY: BMET, BNP  ON Monday, 12/23: BMET. Come to our office, you do not need to be fasting. You may come any time between 7:30AM and 5:00PM. Make sure to check in at the front desk!  Testing/Procedures: No new orders  Follow-Up: Your provider recommends that you schedule a follow-up appointment in 3 months with [name].  We will call you to arrange your 1 year TAVR echocardiogram and office visit.

## 2018-03-08 LAB — BASIC METABOLIC PANEL
BUN/Creatinine Ratio: 20 (ref 10–24)
BUN: 29 mg/dL — AB (ref 8–27)
CALCIUM: 9.5 mg/dL (ref 8.6–10.2)
CO2: 25 mmol/L (ref 20–29)
CREATININE: 1.45 mg/dL — AB (ref 0.76–1.27)
Chloride: 102 mmol/L (ref 96–106)
GFR, EST AFRICAN AMERICAN: 50 mL/min/{1.73_m2} — AB (ref >59)
GFR, EST NON AFRICAN AMERICAN: 43 mL/min/{1.73_m2} — AB (ref >59)
Glucose: 80 mg/dL (ref 65–99)
POTASSIUM: 4.5 mmol/L (ref 3.5–5.2)
Sodium: 142 mmol/L (ref 134–144)

## 2018-03-08 LAB — PRO B NATRIURETIC PEPTIDE: NT-Pro BNP: 4654 pg/mL — ABNORMAL HIGH (ref 0–486)

## 2018-03-11 ENCOUNTER — Other Ambulatory Visit: Payer: Self-pay

## 2018-03-11 ENCOUNTER — Other Ambulatory Visit: Payer: Medicare Other

## 2018-03-11 DIAGNOSIS — I5042 Chronic combined systolic (congestive) and diastolic (congestive) heart failure: Secondary | ICD-10-CM

## 2018-03-11 DIAGNOSIS — R0602 Shortness of breath: Secondary | ICD-10-CM

## 2018-03-12 LAB — BASIC METABOLIC PANEL
BUN / CREAT RATIO: 22 (ref 10–24)
BUN: 32 mg/dL — AB (ref 8–27)
CALCIUM: 9.3 mg/dL (ref 8.6–10.2)
CHLORIDE: 100 mmol/L (ref 96–106)
CO2: 24 mmol/L (ref 20–29)
Creatinine, Ser: 1.47 mg/dL — ABNORMAL HIGH (ref 0.76–1.27)
GFR, EST AFRICAN AMERICAN: 49 mL/min/{1.73_m2} — AB (ref >59)
GFR, EST NON AFRICAN AMERICAN: 43 mL/min/{1.73_m2} — AB (ref >59)
Glucose: 153 mg/dL — ABNORMAL HIGH (ref 65–99)
Potassium: 4.6 mmol/L (ref 3.5–5.2)
Sodium: 140 mmol/L (ref 134–144)

## 2018-03-12 LAB — PRO B NATRIURETIC PEPTIDE: NT-Pro BNP: 2881 pg/mL — ABNORMAL HIGH (ref 0–486)

## 2018-03-22 ENCOUNTER — Ambulatory Visit (HOSPITAL_COMMUNITY)
Admission: RE | Admit: 2018-03-22 | Discharge: 2018-03-22 | Disposition: A | Discharge status: Home / Self Care | Payer: Medicare Other | Source: Ambulatory Visit | Attending: Hematology | Admitting: Hematology

## 2018-03-22 DIAGNOSIS — C3490 Malignant neoplasm of unspecified part of unspecified bronchus or lung: Secondary | ICD-10-CM | POA: Diagnosis not present

## 2018-03-22 DIAGNOSIS — J984 Other disorders of lung: Secondary | ICD-10-CM | POA: Diagnosis not present

## 2018-03-26 NOTE — Progress Notes (Signed)
HEMATOLOGY/ONCOLOGY CLINIC NOTE  Date of Service: 03/27/2018  Patient Care Team: Shelda Pal, DO as PCP - General (Family Medicine)  CHIEF COMPLAINTS/PURPOSE OF CONSULTATION:  History of CLL and NSCLC  Oncologic History:   Austin Lloyd was diagnosed with CLL in 2012 and was treated with one cycle on Bendamustine and possibly Rituxan (incomplete records) before developing acute kidney injury and then completing 5 cycles of Gazyva and Chlorambucil.   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 Treated from 06/13/16 to 06/23/16 with stereotactic body radiotherapy to right upper lobe, total dose of 5000cGy in 5 fractions   Bladder cancer prior to 2010. Low grade prostate cancer currently.   HISTORY OF PRESENTING ILLNESS:   Austin Lloyd is a wonderful 85 y.o. male who has been referred to [place] by Dr. Riki Sheer for evaluation and management of history of CLL and non-small cell lung cancer. He is accompanied today by his wife. The pt reports that he is doing well overall and has enjoyed his recent move from [place].   The pt notes that he has had stable energy levels. He notes that he has not had any changes in breathing and has not developed any constitutional symptom, denying fevers, chills, night sweats and unexpected weight loss. The pt notes that he has not been able to walk as far as he used to and has to sit down often.   The pt reports that his elevated WBC was incidentally picked up while living in [place] in 2012. The pt notes that his treatment with Bendamustine, and possibly Rituxan, was complicated by CHF and acute kidney injury and he notes that he had two infusions at the time.   The pt notes that his lung cancer was initially  diagnosed in 2015 and was resected. It then recurred in 2018 and was treated with radiation.   The pt has had three squamous cell carcinomas, one under left eye, one under left lear, and on occipital scalp. He also has had a basal cell carcinoma. He will be establishing care with dermatology in October.  The pt notes that his bladder cancer and prostate cancer has received BCG and denies radiation to his prostate. He has begun care with urology Dr. Louis Meckel.   He has just begun seeing Dr. [name] in Cardiology as well for his history of CHF, CAD, and aortic stenosis. The pt had an MI in 2015 with subsequent CABG x2.   Most recent lab results (11/06/17) of CBC and CMP is as follows: all values are WNL except for HGB at 12.9, RDW at 15.6, BUN at 33, Creatinine at 1.47, GFR at 43.  On review of systems, pt reports stable energy levels, and denies fevers, chills, night sweats, unexpected weight loss, noticing any new lumps or bumps, changes in breathing, blood in the urine, abdominal pains, chest pain, and any other symptoms.   On PMHx the pt reports DM type II, CKD stage III, CAD s/p two vessel CABG, CLL, invasive adenocarcinoma. 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 Basal cell carcinoma, high grade bladder cancer, low grade prostate cancer, MI in June 2015, acute decompensated heart failure. On Social Hx the pt reports quit smoking cigarettes more than 45 years ago  Interval History:  Austin Lloyd returns today for management and evaluation of his CLL and history of NSCLC. The patient's last visit with [place] was on 11/28/17. He is accompanied today by his wife. The pt reports that he is doing well overall.   The pt reports that about a month ago, after his heart valve replacement, he began developing a cough and now feels that he still has "a cold." He will be seeing his PCP tomorrow regarding this. The pt is producing greenish phlegm with his  cough. He denies feeling more SOB than normal and denies fevers. The pt notes that his energy is slightly lower which he attributes to his cold. The pt denies antibiotic allergies.   The pt notes that he has been set up for cardiac rehabilitation for three times a week, for two months. He denies any concern for balance issues.   Of note since the patient's last visit, pt has had a CT Chest completed on 03/22/18 with results revealing The partially calcified right perihilar mass is stable from available priors dating back to 01/09/2018. Based on the stability and lack of hypermetabolic activity on PET-CT, this is likely postinflammatory or treated tumor. Continued CT follow-up (3-6 months) recommended. 2. New patchy left lower lobe airspace disease, likely inflammatory and potentially early bronchopneumonia. Radiographic follow up recommended. 3. Stable right upper lobe solid and ground-glass opacities can be addressed on subsequent follow-up. 4. Coronary and Aortic Atherosclerosis. Previous T AVR.  Lab results today (03/27/18) of CBC w/diff and CMP is as follows: all values are WNL except for HGB at 12.3, Glucose at 369, BUN at 37, Creatinine at 1.63, Albumin at 3.3, GFR at 38. 03/27/18 LDH at 298  On review of systems, pt reports green phlegm, cough, lower energy levels, and denies increased SOB, fevers, abdominal pains, leg swelling and any other symptoms.    MEDICAL HISTORY:  Past Medical History:  Diagnosis Date  . Aortic stenosis   . Bladder cancer ([place])    T1 status post BCG  . 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 Coronary artery disease with history of myocardial infarction without history of CABG    LIMA to the LAD, SVG to RCA 2016 for treatment of the percent left main stenosis and 80% RCA stenosis.    . Depression   . Diabetes mellitus, type II ([place])   . Hypertension   . Ischemic cardiomyopathy    EF 40% 2019  .  Lung cancer ([place])    Adenocarcinoma of the right lung moderately differentiated  . Lung mass   . PVC's (premature ventricular contractions)   . S/P TAVR (transcatheter aortic valve replacement)     SURGICAL HISTORY: Past Surgical History:  Procedure Laterality Date  . CORONARY ARTERY BYPASS GRAFT     2014  . INGUINAL HERNIA REPAIR    . INTRAOPERATIVE TRANSTHORACIC ECHOCARDIOGRAM  02/05/2018   Procedure: INTRAOPERATIVE TRANSTHORACIC ECHOCARDIOGRAM;  Surgeon: [name], MD;  Location: [facility];  Service: Open Heart Surgery;;  . LUNG REMOVAL, PARTIAL    . RIGHT/LEFT HEART CATH AND CORONARY/GRAFT ANGIOGRAPHY N/A 12/21/2017   Procedure: RIGHT/LEFT HEART CATH AND CORONARY/GRAFT ANGIOGRAPHY;  Surgeon: [name], MD;  Location: [facility];  Service: Cardiovascular;  Laterality: N/A;  . TRANSCATHETER AORTIC VALVE REPLACEMENT, TRANSFEMORAL  02/05/2018   TRANSCATHETER AORTIC VALVE REPLACEMENT, TRANSFEMORAL (  . TRANSCATHETER AORTIC VALVE REPLACEMENT, TRANSFEMORAL N/A 02/05/2018   Procedure: TRANSCATHETER AORTIC VALVE REPLACEMENT, TRANSFEMORAL;  Surgeon: [name], MD;  Location: [facility];  Service: Open Heart Surgery;  Laterality: N/A;  . Ulcer surgery      SOCIAL HISTORY: Social History   Socioeconomic History  . Marital status: Married    Spouse name: Not on file  . Number of children: 3  . Years of education: Not on file  . Highest education level: Not on file  Occupational History  . 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 Financial resource strain: Not on file  . Food insecurity:    Worry: Not on file    Inability: Not on file  . Transportation needs:    Medical: Not on file    Non-medical: Not on file  Tobacco Use  . Smoking status: Former Smoker    Packs/day: 0.20    Years: 30.00    Pack years: 6.00    Types: Cigarettes  . Smokeless tobacco: Never Used  Substance and Sexual Activity  .  Alcohol use: Yes    Alcohol/week: 2.0 standard drinks    Types: 2 Cans of beer per week    Comment: 2 cans beer per week.  . Drug use: Never  . Sexual activity: Not on file  Lifestyle  . Physical activity:    Days per week: Not on file    Minutes per session: Not on file  . Stress: Not on file  Relationships  . Social connections:    Talks on phone: Not on file    Gets together: Not on file    Attends religious service: Not on file    Active member of club or organization: Not on file    Attends meetings of clubs or organizations: Not on file    Relationship status: Not on file  . Intimate partner violence:    Fear of current or ex partner: Not on file    Emotionally abused: Not on file    Physically abused: Not on file    Forced sexual activity: Not on file  Other Topics Concern  . Not on file  Social History Narrative   Lives with wife daughter.  Three children and one grandchild.     FAMILY HISTORY: Family History  Problem Relation Age of Onset  . AAA (abdominal aortic aneurysm) Mother   . Cancer Father        Brain tumor    ALLERGIES:  has No Known Allergies.  MEDICATIONS:  Current Outpatient Medications  Medication Sig Dispense Refill  . aspirin EC 81 MG tablet Take 81 mg by mouth every evening.     Marland Kitchen azithromycin (ZITHROMAX) 250 MG tablet 2 tabs (500mg ) on Day 1 and then 250mg  po daily for 4 days 6 tablet 0  . cefpodoxime (VANTIN) 200 MG tablet Take 1 tablet (200 mg total) by mouth 2 (two) times daily for 10 days. 20 tablet 0  . clopidogrel (PLAVIX) 75 MG tablet Take 1 tablet (75 mg total) by mouth daily with breakfast. 90 tablet 1  . ferrous sulfate 325 (65 FE) MG tablet Take 325 mg by mouth daily with breakfast.    . furosemide (LASIX) 40 MG tablet Take 1 tablet (40 mg total) by mouth daily. 90 tablet 3  . insulin detemir (LEVEMIR) 100 UNIT/ML injection Inject 31 Units into the skin daily.     Marland Kitchen lovastatin (MEVACOR) 40 MG tablet Take 40 mg by mouth at  bedtime.    . Melatonin 5 MG TABS Take 5  mg by mouth at bedtime as needed (for sleep).     . metoprolol succinate (TOPROL-XL) 25 MG 24 hr tablet Take 12.5 mg by mouth daily.    . Multiple Vitamin (MULTIVITAMIN) tablet Take 1 tablet by mouth daily.    . tamsulosin (FLOMAX) 0.4 MG CAPS capsule Take 1 capsule (0.4 mg total) by mouth daily. 90 capsule 3   No current facility-administered medications for this visit.     REVIEW OF SYSTEMS:    A 10+ POINT REVIEW OF SYSTEMS WAS OBTAINED including neurology, dermatology, psychiatry, cardiac, respiratory, lymph, extremities, GI, GU, Musculoskeletal, constitutional, breasts, reproductive, HEENT.  All pertinent positives are noted in the HPI.  All others are negative.   PHYSICAL EXAMINATION: ECOG PERFORMANCE STATUS: 2 - Symptomatic, <50% confined to bed  Vitals:   03/27/18 1142  BP: (!) 123/51  Pulse: 96  Resp: 18  Temp: (!) 97.5 F (36.4 C)  SpO2: 96%   Filed Weights   03/27/18 1142  Weight: 147 lb 11.2 oz (67 kg)   .Body mass index is 25.35 kg/m.  GENERAL:alert, in no acute distress and comfortable SKIN: no acute rashes, no significant lesions EYES: conjunctiva are pink and non-injected, sclera anicteric OROPHARYNX: MMM, no exudates, no oropharyngeal erythema or ulceration NECK: supple, no JVD LYMPH:  no palpable lymphadenopathy in the cervical, axillary or inguinal regions LUNGS: clear to auscultation b/l with normal respiratory effort HEART: regular rate & rhythm, 3/6 SM aorticarea  ABDOMEN:  normoactive bowel sounds , non tender, not distended. No palpable hepatosplenomegaly.  Extremity: no pedal edema PSYCH: alert & oriented x 3 with fluent speech NEURO: no focal motor/sensory deficits   LABORATORY DATA:  I have reviewed the data as listed  . CBC Latest Ref Rng & Units 03/27/2018 02/06/2018 02/05/2018  WBC 4.0 - 10.5 K/uL 9.0 8.2 -  Hemoglobin 13.0 - 17.0 g/dL 12.3(L) 11.1(L) 10.2(L)  Hematocrit 39.0 - 52.0 % 39.1 34.4(L)  30.0(L)  Platelets 150 - 400 K/uL 204 149(L) -   . CBC    Component Value Date/Time   WBC 9.0 03/27/2018 1102   RBC 4.23 03/27/2018 1102   HGB 12.3 (L) 03/27/2018 1102   HGB 13.1 12/17/2017 1306   HCT 39.1 03/27/2018 1102   HCT 39.4 12/17/2017 1306   PLT 204 03/27/2018 1102   PLT 206 12/17/2017 1306   MCV 92.4 03/27/2018 1102   MCV 88 12/17/2017 1306   MCH 29.1 03/27/2018 1102   MCHC 31.5 03/27/2018 1102   RDW 15.2 03/27/2018 1102   RDW 15.0 12/17/2017 1306   LYMPHSABS 1.5 03/27/2018 1102   MONOABS 0.6 03/27/2018 1102   EOSABS 0.2 03/27/2018 1102   BASOSABS 0.0 03/27/2018 1102    . CMP Latest Ref Rng & Units 03/27/2018 03/11/2018 03/07/2018  Glucose 70 - 99 mg/dL 369(H) 153(H) 80  BUN 8 - 23 mg/dL 37(H) 32(H) 29(H)  Creatinine 0.61 - 1.24 mg/dL 1.63(H) 1.47(H) 1.45(H)  Sodium 135 - 145 mmol/L 138 140 142  Potassium 3.5 - 5.1 mmol/L 5.0 4.6 4.5  Chloride 98 - 111 mmol/L 104 100 102  CO2 22 - 32 mmol/L 24 24 25   Calcium 8.9 - 10.3 mg/dL 9.1 9.3 9.5  Total Protein 6.5 - 8.1 g/dL 6.5 - -  Total Bilirubin 0.3 - 1.2 mg/dL 0.5 - -  Alkaline Phos 38 - 126 U/L 104 - -  AST 15 - 41 U/L 24 - -  ALT 0 - 44 U/L 23 - -     RADIOGRAPHIC STUDIES:  I have personally reviewed the radiological images as listed and agreed with the findings in the report. Ct Chest Wo Contrast  Result Date: 03/22/2018 CLINICAL DATA:  Right lung cancer diagnosed in 2015 with partial right lung resection. Subsequent radiation therapy in 2017. History leukemia and bladder cancer. EXAM: CT CHEST WITHOUT CONTRAST TECHNIQUE: Multidetector CT imaging of the chest was performed following the standard protocol without IV contrast. COMPARISON:  Radiographs 02/05/2018, PET-CT 01/18/2018 and chest CT 01/09/2018. FINDINGS: Cardiovascular: Moderate atherosclerosis of the aorta, great vessels and coronary arteries status post median sternotomy and T AVR. The heart size is normal. There is no pericardial effusion.  Mediastinum/Nodes: There are no enlarged mediastinal, hilar or axillary lymph nodes.There are small calcified right hilar and subcarinal lymph nodes. The thyroid gland, trachea and esophagus demonstrate no significant findings. Lungs/Pleura: Previously demonstrated right pleural effusion has nearly completely resolved. The partially calcified right perihilar mass is stable, measuring approximately 5.3 x 3.4 cm on image 57/5. There are stable postsurgical changes in the right upper lobe. Small right upper lobe pulmonary nodules are stable. There is a stable focal ground-glass nodule measuring 13 x 8 mm on image 27/5. There is new peribronchial airspace opacity in the left lower lobe, likely inflammatory. Upper abdomen: Mild hepatic steatosis and cholelithiasis again noted. The adrenal glands appear normal. Musculoskeletal/Chest wall: There is no chest wall mass or suspicious osseous finding. IMPRESSION: 1. The partially calcified right perihilar mass is stable from available priors dating back to 01/09/2018. Based on the stability and lack of hypermetabolic activity on PET-CT, this is likely postinflammatory or treated tumor. Continued CT follow-up (3-6 months) recommended. 2. New patchy left lower lobe airspace disease, likely inflammatory and potentially early bronchopneumonia. Radiographic follow up recommended. 3. Stable right upper lobe solid and ground-glass opacities can be addressed on subsequent follow-up. 4. Coronary and Aortic Atherosclerosis (ICD10-I70.0). Previous T AVR. Electronically Signed   By: Richardean Sale M.D.   On: 03/22/2018 13:43     08/04/17 CT Chest:    ASSESSMENT & PLAN:  85 y.o. male with  1. History of CLL 03/2013 treated with Bendamustine and possibly Rituxan (outside records incomplete) but discontinued after 1 dose due to acute kidney injury 04/2013 to 08/2013 treated with 5 cycles of Gazyva and Chlorambucil  04/2015 FISH was without chromosomal abnormalities    PLAN -labs from 9/1 reviewed no lymphocytosis. No anemia or thrombocytopenia. -no constitutional sypmtoms. -no overt indication for additional treatment of patients CLL at this time.  2. History of Lung Cancer Invasive adenocarcinoma of the right upper lung in 2015, moderately differentiated, initially Stage IB with visceral pleural invasion but resected with clear margins and no lymph node involvement 04/2016 Biopsy confirmed right upper lobe recurrence of non-small cell cancer. Treated with stereotactic body radiotherapy to right upper lobe , total dose of 5000cGy in 5 fractions  01/2017 New right lower lobe nodule initially concerning for recurrence, but then resolved spontaneously Plan F/u Ct chest requested in 3-4 months prior to f/u.  3. Prostate cancer low grade Has maintained surveillance with urology and every 6 month PSA checks -on lupron with urology  4. History of high grade bladder cancer Continue follow up with urology  5. History of Squamous cell carcinoma x3 and Basal cell carcinoma -continue f/u with dermatology  6. CKD Stage III - stable . Continue f/u with PCP  7. H/o CAD, CHF, with mod-severe Aortic Stenosis.modMR -continue f/u with cardiology for continued Mx.   8. Bronchopneumonia- seen on 03/22/18 CT  Chest, with clinical correlation on 03/27/18  PLAN: -Discussed pt labwork today, 03/27/18; HGB improved to 12.3, PLT normalized to 204k, WBC normal at 9.0k, LDH increased to 298 -Discussed the 03/22/18 CT Chest which revealed The partially calcified right perihilar mass is stable from available priors dating back to 01/09/2018. Based on the stability and lack of hypermetabolic activity on PET-CT, this is likely postinflammatory or treated tumor. Continued CT follow-up (3-6 months) recommended. 2. New patchy left lower lobe airspace disease, likely inflammatory and potentially early bronchopneumonia. Radiographic follow up recommended. 3. Stable right upper lobe solid and  ground-glass opacities can be addressed on subsequent follow-up. 4. Coronary and Aortic Atherosclerosis. Previous T AVR. -Follow up with PCP for appropriate DM management and blood sugar control -Correlated CT imaging and clinical judgement, believe that pt developed a pneumonia during his last [facility] stay, and will order antibiotics -Vantin and Zpack -Explicitly discussed that if the pt feels that he is getting worse in the next 2-3 days, he should present to the ED for [facility] admission and IV antibiotics.  -Pt will also follow up with PCP tomorrow for further evaluation of appropriate resolution of pneumonia  -LDH increased likely due to current infection -The pt shows no clinical or lab progression of his CLL at this time.  -No indication to initiate treatment at this time.   -Continue follow up with urologist Dr. Louis Meckel for prostate and bladder cancer management  -Continue follow up with Dr. [name] in cardiology -Will see the pt back in 3 months    Orders Placed This Encounter  Procedures  . CBC with Differential/Platelet    Standing Status:   Future    Standing Expiration Date:   05/01/2019  . CMP ([place] only)    Standing Status:   Future    Standing Expiration Date:   03/28/2019  . Lactate dehydrogenase    Standing Status:   Future    Standing Expiration Date:   03/28/2019    RTC with Dr Irene Limbo with labs in 3 months    All of the patients questions were answered with apparent satisfaction. The patient knows to call the clinic with any problems, questions or concerns.  The total time spent in the appt was 30 minutes and more than 50% was on counseling and direct patient cares.    Sullivan Lone MD MS AAHIVMS [facility] Hematology/Oncology Physician [facility]  (Office):       [phone] (Work cell):  [phone] (Fax):           [phone]  03/27/2018 1:41 PM  I, Baldwin Jamaica, am acting as a scribe for Dr. Sullivan Lone.   .I have  reviewed the above documentation for accuracy and completeness, and I agree with the above. Brunetta Genera MD

## 2018-03-27 ENCOUNTER — Inpatient Hospital Stay (HOSPITAL_BASED_OUTPATIENT_CLINIC_OR_DEPARTMENT_OTHER): Payer: Medicare Other | Admitting: Hematology

## 2018-03-27 ENCOUNTER — Inpatient Hospital Stay: Payer: Medicare Other | Attending: Hematology

## 2018-03-27 ENCOUNTER — Telehealth: Payer: Self-pay | Admitting: *Deleted

## 2018-03-27 VITALS — BP 123/51 | HR 96 | Temp 97.5°F | Resp 18 | Ht 64.0 in | Wt 147.7 lb

## 2018-03-27 DIAGNOSIS — I252 Old myocardial infarction: Secondary | ICD-10-CM | POA: Diagnosis not present

## 2018-03-27 DIAGNOSIS — C9110 Chronic lymphocytic leukemia of B-cell type not having achieved remission: Secondary | ICD-10-CM | POA: Diagnosis not present

## 2018-03-27 DIAGNOSIS — Z951 Presence of aortocoronary bypass graft: Secondary | ICD-10-CM | POA: Diagnosis not present

## 2018-03-27 DIAGNOSIS — Z952 Presence of prosthetic heart valve: Secondary | ICD-10-CM | POA: Insufficient documentation

## 2018-03-27 DIAGNOSIS — I130 Hypertensive heart and chronic kidney disease with heart failure and stage 1 through stage 4 chronic kidney disease, or unspecified chronic kidney disease: Secondary | ICD-10-CM | POA: Diagnosis not present

## 2018-03-27 DIAGNOSIS — J189 Pneumonia, unspecified organism: Secondary | ICD-10-CM

## 2018-03-27 DIAGNOSIS — Z794 Long term (current) use of insulin: Secondary | ICD-10-CM | POA: Diagnosis not present

## 2018-03-27 DIAGNOSIS — E1122 Type 2 diabetes mellitus with diabetic chronic kidney disease: Secondary | ICD-10-CM

## 2018-03-27 DIAGNOSIS — Z923 Personal history of irradiation: Secondary | ICD-10-CM | POA: Insufficient documentation

## 2018-03-27 DIAGNOSIS — C61 Malignant neoplasm of prostate: Secondary | ICD-10-CM

## 2018-03-27 DIAGNOSIS — C3490 Malignant neoplasm of unspecified part of unspecified bronchus or lung: Secondary | ICD-10-CM

## 2018-03-27 DIAGNOSIS — Z85828 Personal history of other malignant neoplasm of skin: Secondary | ICD-10-CM | POA: Diagnosis not present

## 2018-03-27 DIAGNOSIS — Z87891 Personal history of nicotine dependence: Secondary | ICD-10-CM | POA: Insufficient documentation

## 2018-03-27 DIAGNOSIS — Z8551 Personal history of malignant neoplasm of bladder: Secondary | ICD-10-CM

## 2018-03-27 DIAGNOSIS — Z85118 Personal history of other malignant neoplasm of bronchus and lung: Secondary | ICD-10-CM | POA: Insufficient documentation

## 2018-03-27 DIAGNOSIS — Z79899 Other long term (current) drug therapy: Secondary | ICD-10-CM | POA: Diagnosis not present

## 2018-03-27 DIAGNOSIS — N183 Chronic kidney disease, stage 3 (moderate): Secondary | ICD-10-CM | POA: Insufficient documentation

## 2018-03-27 DIAGNOSIS — Z7982 Long term (current) use of aspirin: Secondary | ICD-10-CM | POA: Diagnosis not present

## 2018-03-27 DIAGNOSIS — J181 Lobar pneumonia, unspecified organism: Secondary | ICD-10-CM

## 2018-03-27 LAB — CMP (CANCER CENTER ONLY)
ALK PHOS: 104 U/L (ref 38–126)
ALT: 23 U/L (ref 0–44)
AST: 24 U/L (ref 15–41)
Albumin: 3.3 g/dL — ABNORMAL LOW (ref 3.5–5.0)
Anion gap: 10 (ref 5–15)
BUN: 37 mg/dL — ABNORMAL HIGH (ref 8–23)
CALCIUM: 9.1 mg/dL (ref 8.9–10.3)
CO2: 24 mmol/L (ref 22–32)
CREATININE: 1.63 mg/dL — AB (ref 0.61–1.24)
Chloride: 104 mmol/L (ref 98–111)
GFR, EST AFRICAN AMERICAN: 44 mL/min — AB (ref >60)
GFR, EST NON AFRICAN AMERICAN: 38 mL/min — AB (ref >60)
Glucose, Bld: 369 mg/dL — ABNORMAL HIGH (ref 70–99)
Potassium: 5 mmol/L (ref 3.5–5.1)
SODIUM: 138 mmol/L (ref 135–145)
Total Bilirubin: 0.5 mg/dL (ref 0.3–1.2)
Total Protein: 6.5 g/dL (ref 6.5–8.1)

## 2018-03-27 LAB — CBC WITH DIFFERENTIAL/PLATELET
Abs Immature Granulocytes: 0.03 10*3/uL (ref 0.00–0.07)
Basophils Absolute: 0 10*3/uL (ref 0.0–0.1)
Basophils Relative: 0 %
EOS PCT: 2 %
Eosinophils Absolute: 0.2 10*3/uL (ref 0.0–0.5)
HCT: 39.1 % (ref 39.0–52.0)
HEMOGLOBIN: 12.3 g/dL — AB (ref 13.0–17.0)
Immature Granulocytes: 0 %
LYMPHS PCT: 16 %
Lymphs Abs: 1.5 10*3/uL (ref 0.7–4.0)
MCH: 29.1 pg (ref 26.0–34.0)
MCHC: 31.5 g/dL (ref 30.0–36.0)
MCV: 92.4 fL (ref 80.0–100.0)
MONO ABS: 0.6 10*3/uL (ref 0.1–1.0)
MONOS PCT: 7 %
Neutro Abs: 6.8 10*3/uL (ref 1.7–7.7)
Neutrophils Relative %: 75 %
Platelets: 204 10*3/uL (ref 150–400)
RBC: 4.23 MIL/uL (ref 4.22–5.81)
RDW: 15.2 % (ref 11.5–15.5)
WBC: 9 10*3/uL (ref 4.0–10.5)
nRBC: 0 % (ref 0.0–0.2)

## 2018-03-27 LAB — LACTATE DEHYDROGENASE: LDH: 298 U/L — ABNORMAL HIGH (ref 98–192)

## 2018-03-27 MED ORDER — CEFPODOXIME PROXETIL 200 MG PO TABS: 200.0000 mg | ORAL_TABLET | Freq: Two times a day (BID) | ORAL | 0 refills | Status: AC

## 2018-03-27 MED ORDER — AZITHROMYCIN 250 MG PO TABS: ORAL_TABLET | ORAL | 0 refills | Status: DC

## 2018-03-27 NOTE — Telephone Encounter (Signed)
Received Mohs surgery results from [facility], [place]; forwarded to provider/SLS 01/08

## 2018-03-28 ENCOUNTER — Ambulatory Visit (INDEPENDENT_AMBULATORY_CARE_PROVIDER_SITE_OTHER): Payer: Medicare Other | Admitting: Family Medicine

## 2018-03-28 ENCOUNTER — Encounter: Payer: Self-pay | Admitting: Family Medicine

## 2018-03-28 ENCOUNTER — Telehealth: Payer: Self-pay

## 2018-03-28 VITALS — BP 124/72 | HR 47 | Temp 97.6°F | Ht 64.0 in | Wt 148.0 lb

## 2018-03-28 DIAGNOSIS — J189 Pneumonia, unspecified organism: Secondary | ICD-10-CM

## 2018-03-28 MED ORDER — INSULIN PEN NEEDLE 31G X 8 MM MISC: 3 refills | Status: DC

## 2018-03-28 MED ORDER — BENZONATATE 100 MG PO CAPS: 100.0000 mg | ORAL_CAPSULE | Freq: Three times a day (TID) | ORAL | 0 refills | Status: DC | PRN

## 2018-03-28 MED ORDER — INSULIN DETEMIR 100 UNIT/ML ~~LOC~~ SOLN: 31.0000 [IU] | Freq: Every day | SUBCUTANEOUS | 3 refills | Status: DC

## 2018-03-28 NOTE — Telephone Encounter (Signed)
Left a detailed msgn concerning newly scheduled appointment. Mailed a letter with a  Calender enclosed. Per 1/8 los

## 2018-03-28 NOTE — Patient Instructions (Signed)
Continue to push fluids, practice good hand hygiene, and cover your mouth if you cough.  If you start having fevers, shaking or shortness of breath, seek immediate care.  Let us know if you need anything.  

## 2018-03-28 NOTE — Progress Notes (Signed)
Pre visit review using our clinic review tool, if applicable. No additional management support is needed unless otherwise documented below in the visit note. 

## 2018-03-28 NOTE — Progress Notes (Signed)
Chief Complaint  Patient presents with  . Cough    Austin Lloyd here for URI complaints.  Duration: 1 month  Associated symptoms: sinus congestion, rhinorrhea and cough Denies: sinus pain, itchy watery eyes, ear pain, ear drainage, sore throat, wheezing, shortness of breath, myalgia and fevers Treatment to date: Zpak, Vantin, OTC cold meds Sick contacts: No  ROS:  Const: Denies fevers HEENT: As noted in HPI Lungs: No SOB  Past Medical History:  Diagnosis Date  . Aortic stenosis   . Bladder cancer ([place])    T1 status post BCG  . 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 Coronary artery disease with history of myocardial infarction without history of CABG    LIMA to the LAD, SVG to RCA 2016 for treatment of the percent left main stenosis and 80% RCA stenosis.    . Depression   . Diabetes mellitus, type II ([place])   . Hypertension   . Ischemic cardiomyopathy    EF 40% 2019  . Lung cancer ([place])    Adenocarcinoma of the right lung moderately differentiated  . Lung mass   . PVC's (premature ventricular contractions)   . S/P TAVR (transcatheter aortic valve replacement)     BP 124/72 (BP Location: Left Arm, Patient Position: Sitting, Cuff Size: Normal)   Pulse (!) 47   Temp 97.6 F (36.4 C) (Oral)   Ht 5\' 4"  (1.626 m)   Wt 148 lb (67.1 kg)   SpO2 94%   BMI 25.40 kg/m  General: Awake, alert, appears stated age HEENT: AT, [place], ears patent b/l and TM's neg, nares patent w/o discharge, pharynx pink and without exudates, MMM Neck: No masses or asymmetry Heart: RRR, +SEM Lungs: Clear overall, decreased sounds on L base compared to R; no accessory muscle use Psych: Age appropriate judgment and insight, normal mood and affect  Community acquired pneumonia, unspecified laterality - Plan: benzonatate (TESSALON) 100 MG capsule  Orders as above. Pt already rx'd 2 abx for suspected PNA.  Continue to push  fluids, practice good hand hygiene, cover mouth when coughing. F/u prn. If starting to experience fevers, shaking, or shortness of breath, seek immediate care. Pt voiced understanding and agreement to the plan.  [place], DO 03/28/18 12:07 PM

## 2018-04-01 DIAGNOSIS — L602 Onychogryphosis: Secondary | ICD-10-CM | POA: Diagnosis not present

## 2018-04-01 DIAGNOSIS — L84 Corns and callosities: Secondary | ICD-10-CM | POA: Diagnosis not present

## 2018-04-01 DIAGNOSIS — E1159 Type 2 diabetes mellitus with other circulatory complications: Secondary | ICD-10-CM | POA: Diagnosis not present

## 2018-04-02 ENCOUNTER — Encounter: Payer: Self-pay | Admitting: Thoracic Surgery (Cardiothoracic Vascular Surgery)

## 2018-04-08 ENCOUNTER — Other Ambulatory Visit: Payer: Self-pay | Admitting: Family Medicine

## 2018-04-08 MED ORDER — GLUCOSE BLOOD VI STRP: ORAL_STRIP | 6 refills | Status: DC

## 2018-04-08 MED ORDER — ACCU-CHEK SOFTCLIX LANCETS MISC: 6 refills | Status: DC

## 2018-04-11 ENCOUNTER — Telehealth (HOSPITAL_COMMUNITY): Payer: Self-pay

## 2018-04-11 ENCOUNTER — Encounter (INDEPENDENT_AMBULATORY_CARE_PROVIDER_SITE_OTHER): Payer: Self-pay

## 2018-04-11 ENCOUNTER — Other Ambulatory Visit: Payer: Self-pay | Admitting: Family Medicine

## 2018-04-11 MED ORDER — TRAZODONE HCL 50 MG PO TABS: 25.0000 mg | ORAL_TABLET | Freq: Every evening | ORAL | 3 refills | Status: DC | PRN

## 2018-04-11 NOTE — Telephone Encounter (Signed)
Cardiac Rehab Medication Review by a Pharmacist  Does the patient  feel that his/her medications are working for him/her?  yes  Has the patient been experiencing any side effects to the medications prescribed?  no  Does the patient measure his/her own blood pressure or blood glucose at home?  yes blood sugar twice a day  Does the patient have any problems obtaining medications due to transportation or finances?   no  Understanding of regimen: good Understanding of indications: fair Potential of compliance: good    Pharmacist comments: He is a little confused about the indication of his Plavix. I tried to counsel him over the phone about this medication, but he may need reinforcement of plavix indication.   Harrietta Guardian, PharmD PGY1 Pharmacy Resident 04/11/2018    4:23 PM Please check AMION for all [place] numbers

## 2018-04-11 NOTE — Progress Notes (Signed)
Trazodone sent in. I will see him in 4 weeks.

## 2018-04-17 NOTE — Progress Notes (Signed)
Austin Lloyd 85 y.o. male DOB 12-14-1929 MRN [id number]       Nutrition Screener Note  No diagnosis found. Past Medical History:  Diagnosis Date  . Aortic stenosis   . Bladder cancer ([place])    T1 status post BCG  . 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 Coronary artery disease with history of myocardial infarction without history of CABG    LIMA to the LAD, SVG to RCA 2016 for treatment of the percent left main stenosis and 80% RCA stenosis.    . Depression   . Diabetes mellitus, type II ([place])   . Hypertension   . Ischemic cardiomyopathy    EF 40% 2019  . Lung cancer ([place])    Adenocarcinoma of the right lung moderately differentiated  . Lung mass   . PVC's (premature ventricular contractions)   . S/P TAVR (transcatheter aortic valve replacement)    Meds reviewed.    Current Outpatient Medications (Endocrine & Metabolic):  .  insulin detemir (LEVEMIR) 100 UNIT/ML injection, Inject 0.31 mLs (31 Units total) into the skin daily.  Current Outpatient Medications (Cardiovascular):  .  furosemide (LASIX) 40 MG tablet, Take 1 tablet (40 mg total) by mouth daily. Marland Kitchen  losartan (COZAAR) 25 MG tablet, Take 25 mg by mouth daily. Marland Kitchen  lovastatin (MEVACOR) 40 MG tablet, Take 40 mg by mouth at bedtime. .  metoprolol succinate (TOPROL-XL) 25 MG 24 hr tablet, Take 12.5 mg by mouth daily.  Current Outpatient Medications (Respiratory):  .  benzonatate (TESSALON) 100 MG capsule, Take 1 capsule (100 mg total) by mouth 3 (three) times daily as needed. (Patient not taking: Reported on 04/11/2018)  Current Outpatient Medications (Analgesics):  .  aspirin EC 81 MG tablet, Take 81 mg by mouth every evening.   Current Outpatient Medications (Hematological):  .  clopidogrel (PLAVIX) 75 MG tablet, Take 1 tablet (75 mg total) by mouth daily with breakfast. .  ferrous sulfate 325 (65 FE) MG tablet, Take 325 mg by mouth daily with  breakfast.  Current Outpatient Medications (Other):  Marland Kitchen  ACCU-CHEK SOFTCLIX LANCETS lancets, Test blood sugar three times a day.  DX E11.9 .  azithromycin (ZITHROMAX) 250 MG tablet, 2 tabs (500mg ) on Day 1 and then 250mg  po daily for 4 days (Patient not taking: Reported on 04/11/2018) .  glucose blood (ACCU-CHEK AVIVA PLUS) test strip, Test blood sugar three times a day.  DX E11.9 .  Insulin Pen Needle 31G X 8 MM MISC, To use with insulin .  Melatonin 5 MG TABS, Take 5 mg by mouth at bedtime as needed (for sleep).  .  Multiple Vitamin (MULTIVITAMIN) tablet, Take 1 tablet by mouth daily. .  tamsulosin (FLOMAX) 0.4 MG CAPS capsule, Take 1 capsule (0.4 mg total) by mouth daily. .  traZODone (DESYREL) 50 MG tablet, Take 0.5-1 tablets (25-50 mg total) by mouth at bedtime as needed for sleep. (Patient not taking: Reported on 04/11/2018)   HT: Ht Readings from Last 1 Encounters:  03/28/18 5\' 4"  (1.626 m)    WT: Wt Readings from Last 5 Encounters:  03/28/18 148 lb (67.1 kg)  03/27/18 147 lb 11.2 oz (67 kg)  03/07/18 150 lb 12.8 oz (68.4 kg)  02/20/18 147 lb 12.8 oz (67 kg)  02/06/18 151 lb 7.3 oz (68.7 kg)     BMI = 25.39    03/28/18   Current tobacco use? No       Labs:  Lipid Panel     Component Value Date/Time   CHOL 183 11/06/2017 0815   TRIG 86 11/06/2017 0815   HDL 58 11/06/2017 0815   CHOLHDL 3.2 11/06/2017 0815   LDLCALC 108 (H) 11/06/2017 0815    Lab Results  Component Value Date   HGBA1C 7.1 (H) 02/01/2018   CBG (last 3)  No results for input(s): GLUCAP in the last 72 hours.  Nutrition Diagnosis ? Food-and nutrition-related knowledge deficit related to lack of exposure to information as related to diagnosis of: ? CVD ? Type 2 Diabetes  Nutrition Goal(s):  ? To be determined  Plan:  Pt to attend nutrition classes ? Nutrition I ? Nutrition II ? Portion Distortion  ? Diabetes Blitz ? Diabetes Q & A Will provide client-centered nutrition education as part of  interdisciplinary care.   Monitor and evaluate progress toward nutrition goal with team.  Laurina Bustle, MS, RD, LDN 04/17/2018 2:56 PM

## 2018-04-18 ENCOUNTER — Telehealth (HOSPITAL_COMMUNITY): Payer: Self-pay | Admitting: *Deleted

## 2018-04-18 ENCOUNTER — Telehealth: Payer: Self-pay | Admitting: Cardiology

## 2018-04-18 ENCOUNTER — Encounter (HOSPITAL_COMMUNITY): Payer: Self-pay

## 2018-04-18 ENCOUNTER — Encounter: Payer: Self-pay | Admitting: Cardiology

## 2018-04-18 ENCOUNTER — Encounter (HOSPITAL_COMMUNITY)
Admission: RE | Admit: 2018-04-18 | Discharge: 2018-04-18 | Disposition: A | Discharge status: Home / Self Care | Payer: Medicare Other | Source: Ambulatory Visit | Attending: Cardiology | Admitting: Cardiology

## 2018-04-18 VITALS — BP 128/60 | HR 74 | Ht 63.75 in | Wt 149.0 lb

## 2018-04-18 DIAGNOSIS — Z952 Presence of prosthetic heart valve: Secondary | ICD-10-CM | POA: Insufficient documentation

## 2018-04-18 NOTE — Progress Notes (Signed)
Patient is noted to have frequent bigeminal PVC's and non sustained multifocal runs of PVC's  This afternoon at cardiac orientation. Patient is asymptomatic and has history of PVC's. Alvina Filbert LPN called and notified about today's ectopy. Blood pressure 128/60. Resting heart rate 74. Oxygen saturation 97% on room air.Will fax exercise flow sheets to Dr. Rosezella [place] office for review. Patient completed walk test without difficulty.Will continue to monitor the patient throughout  the program. Barnet Pall, RN,BSN 04/18/2018 2:42 PM

## 2018-04-18 NOTE — Progress Notes (Signed)
Cardiac Individual Treatment Plan  Patient Details  Name: Austin Lloyd MRN: [id number] Date of Birth: 26-Jan-1930 Referring Provider:     CARDIAC REHAB PHASE II ORIENTATION from 04/18/2018 in [place]  Referring Provider  [name] MD      Initial Encounter Date:    CARDIAC REHAB PHASE II ORIENTATION from 04/18/2018 in [place]  Date  04/18/18      Visit Diagnosis: S/P TAVR (transcatheter aortic valve replacement)  Patient's Home Medications on Admission:  Current Outpatient Medications:  .  ACCU-CHEK SOFTCLIX LANCETS lancets, Test blood sugar three times a day.  DX E11.9, Disp: 200 each, Rfl: 6 .  aspirin EC 81 MG tablet, Take 81 mg by mouth every evening. , Disp: , Rfl:  .  clopidogrel (PLAVIX) 75 MG tablet, Take 1 tablet (75 mg total) by mouth daily with breakfast., Disp: 90 tablet, Rfl: 1 .  ferrous sulfate 325 (65 FE) MG tablet, Take 325 mg by mouth daily with breakfast., Disp: , Rfl:  .  furosemide (LASIX) 40 MG tablet, Take 1 tablet (40 mg total) by mouth daily., Disp: 90 tablet, Rfl: 3 .  glucose blood (ACCU-CHEK AVIVA PLUS) test strip, Test blood sugar three times a day.  DX E11.9, Disp: 200 each, Rfl: 6 .  insulin detemir (LEVEMIR) 100 UNIT/ML injection, Inject 0.31 mLs (31 Units total) into the skin daily., Disp: 10 mL, Rfl: 3 .  Insulin Pen Needle 31G X 8 MM MISC, To use with insulin, Disp: 100 each, Rfl: 3 .  losartan (COZAAR) 25 MG tablet, Take 25 mg by mouth daily., Disp: , Rfl:  .  lovastatin (MEVACOR) 40 MG tablet, Take 40 mg by mouth at bedtime., Disp: , Rfl:  .  metoprolol succinate (TOPROL-XL) 25 MG 24 hr tablet, Take 12.5 mg by mouth daily., Disp: , Rfl:  .  Multiple Vitamin (MULTIVITAMIN) tablet, Take 1 tablet by mouth daily., Disp: , Rfl:  .  tamsulosin (FLOMAX) 0.4 MG CAPS capsule, Take 1 capsule (0.4 mg total) by mouth daily., Disp: 90 capsule, Rfl: 3 .  traZODone (DESYREL) 50 MG  tablet, Take 0.5-1 tablets (25-50 mg total) by mouth at bedtime as needed for sleep., Disp: 30 tablet, Rfl: 3 .  azithromycin (ZITHROMAX) 250 MG tablet, 2 tabs (500mg ) on Day 1 and then 250mg  po daily for 4 days (Patient not taking: Reported on 04/11/2018), Disp: 6 tablet, Rfl: 0 .  benzonatate (TESSALON) 100 MG capsule, Take 1 capsule (100 mg total) by mouth 3 (three) times daily as needed. (Patient not taking: Reported on 04/11/2018), Disp: 30 capsule, Rfl: 0 .  Melatonin 5 MG TABS, Take 5 mg by mouth at bedtime as needed (for sleep). , Disp: , Rfl:   Past Medical History: Past Medical History:  Diagnosis Date  . Aortic stenosis   . Bladder cancer ([place])    T1 status post BCG  . 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 Coronary artery disease with history of myocardial infarction without history of CABG    LIMA to the LAD, SVG to RCA 2016 for treatment of the percent left main stenosis and 80% RCA stenosis.    . Depression   . Diabetes mellitus, type II ([place])   . Hypertension   . Ischemic cardiomyopathy    EF 40% 2019  . Lung cancer ([place])    Adenocarcinoma of the right lung moderately differentiated  .  Lung mass   . PVC's (premature ventricular contractions)   . S/P TAVR (transcatheter aortic valve replacement)     Tobacco Use: Social History   Tobacco Use  Smoking Status Former Smoker  . Packs/day: 0.20  . Years: 30.00  . Pack years: 6.00  . Types: Cigarettes  Smokeless Tobacco Never Used    Labs: Recent Review Flowsheet Data    Labs for ITP Cardiac and Pulmonary Rehab Latest Ref Rng & Units 02/01/2018 02/05/2018 02/05/2018 02/05/2018 02/05/2018   Cholestrol 100 - 199 mg/dL - - - - -   LDLCALC 0 - 99 mg/dL - - - - -   HDL >39 mg/dL - - - - -   Trlycerides 0 - 149 mg/dL - - - - -   Hemoglobin A1c 4.8 - 5.6 % 7.1(H) - - - -   PHART 7.350 - 7.450 7.442 - - - -   PCO2ART 32.0 - 48.0 mmHg 35.8 - - - -    HCO3 20.0 - 28.0 mmol/L 24.0 - - - -   TCO2 22 - 32 mmol/L - 27 27 28 24    ACIDBASEDEF 0.0 - 2.0 mmol/L - - - - -   O2SAT % 94.9 - - - -      Capillary Blood Glucose: Lab Results  Component Value Date   GLUCAP 307 (H) 02/06/2018   GLUCAP 152 (H) 02/06/2018   GLUCAP 176 (H) 02/05/2018   GLUCAP 141 (H) 02/05/2018   GLUCAP 164 (H) 02/05/2018     Exercise Target Goals: Exercise Program Goal: Individual exercise prescription set using results from initial 6 min walk test and THRR while considering  patient's activity barriers and safety.   Exercise Prescription Goal: Initial exercise prescription builds to 30-45 minutes a day of aerobic activity, 2-3 days per week.  Home exercise guidelines will be given to patient during program as part of exercise prescription that the participant will acknowledge.  Activity Barriers & Risk Stratification: Activity Barriers & Cardiac Risk Stratification - 04/18/18 1513      Activity Barriers & Cardiac Risk Stratification   Activity Barriers  None    Cardiac Risk Stratification  High       6 Minute Walk: 6 Minute Walk    Row Name 04/18/18 1512         6 Minute Walk   Phase  Initial     Distance  897 feet     Walk Time  6 minutes     # of Rest Breaks  0     MPH  1.7     METS  1.3     RPE  12     Perceived Dyspnea   0     VO2 Peak  4.54     Symptoms  No     Resting HR  74 bpm     Resting BP  128/60     Resting Oxygen Saturation   97 %     Exercise Oxygen Saturation  during 6 min walk  95 %     Max Ex. HR  98 bpm     Max Ex. BP  126/74     2 Minute Post BP  122/70        Oxygen Initial Assessment:   Oxygen Re-Evaluation:   Oxygen Discharge (Final Oxygen Re-Evaluation):   Initial Exercise Prescription: Initial Exercise Prescription - 04/18/18 1400      Date of Initial Exercise RX and Referring Provider   Date  04/18/18  Referring Provider  [name] MD    Expected Discharge Date  07/26/18      NuStep    Level  1    SPM  75    Minutes  10    METs  1.5      Arm Ergometer   Level  1    Watts  5    Minutes  10    METs  1.37      Track   Laps  6    Minutes  10    METs  2      Prescription Details   Frequency (times per week)  3x    Duration  Progress to 30 minutes of continuous aerobic without signs/symptoms of physical distress      Intensity   THRR 40-80% of Max Heartrate  54-107    Ratings of Perceived Exertion  11-13    Perceived Dyspnea  0-4      Progression   Progression  Continue progressive overload as per policy without signs/symptoms or physical distress.      Resistance Training   Training Prescription  Yes    Weight  2lbs    Reps  10-15       Perform Capillary Blood Glucose checks as needed.  Exercise Prescription Changes:   Exercise Comments:   Exercise Goals and Review: Exercise Goals    Row Name 04/18/18 1338             Exercise Goals   Increase Physical Activity  Yes       Intervention  Provide advice, education, support and counseling about physical activity/exercise needs.;Develop an individualized exercise prescription for aerobic and resistive training based on initial evaluation findings, risk stratification, comorbidities and participant's personal goals.       Expected Outcomes  Long Term: Exercising regularly at least 3-5 days a week.;Short Term: Attend rehab on a regular basis to increase amount of physical activity.;Long Term: Add in home exercise to make exercise part of routine and to increase amount of physical activity.       Increase Strength and Stamina  Yes       Intervention  Provide advice, education, support and counseling about physical activity/exercise needs.;Develop an individualized exercise prescription for aerobic and resistive training based on initial evaluation findings, risk stratification, comorbidities and participant's personal goals.       Expected Outcomes  Short Term: Increase workloads from initial exercise  prescription for resistance, speed, and METs.;Short Term: Perform resistance training exercises routinely during rehab and add in resistance training at home;Long Term: Improve cardiorespiratory fitness, muscular endurance and strength as measured by increased METs and functional capacity (6MWT)       Able to understand and use rate of perceived exertion (RPE) scale  Yes       Intervention  Provide education and explanation on how to use RPE scale       Expected Outcomes  Short Term: Able to use RPE daily in rehab to express subjective intensity level;Long Term:  Able to use RPE to guide intensity level when exercising independently       Knowledge and understanding of Target Heart Rate Range (THRR)  Yes       Intervention  Provide education and explanation of THRR including how the numbers were predicted and where they are located for reference       Expected Outcomes  Short Term: Able to state/look up THRR;Long Term: Able to use THRR to govern intensity when  exercising independently;Short Term: Able to use daily as guideline for intensity in rehab       Able to check pulse independently  Yes       Intervention  Provide education and demonstration on how to check pulse in carotid and radial arteries.;Review the importance of being able to check your own pulse for safety during independent exercise       Expected Outcomes  Short Term: Able to explain why pulse checking is important during independent exercise;Long Term: Able to check pulse independently and accurately       Understanding of Exercise Prescription  Yes       Intervention  Provide education, explanation, and written materials on patient's individual exercise prescription       Expected Outcomes  Short Term: Able to explain program exercise prescription;Long Term: Able to explain home exercise prescription to exercise independently          Exercise Goals Re-Evaluation :   Discharge Exercise Prescription (Final Exercise Prescription  Changes):   Nutrition:  Target Goals: Understanding of nutrition guidelines, daily intake of sodium 1500mg , cholesterol 200mg , calories 30% from fat and 7% or less from saturated fats, daily to have 5 or more servings of fruits and vegetables.  Biometrics: Pre Biometrics - 04/18/18 1513      Pre Biometrics   Height  5' 3.75" (1.619 m)    Weight  67.6 kg    Waist Circumference  40 inches    Hip Circumference  39.5 inches    Waist to Hip Ratio  1.01 %    BMI (Calculated)  25.79    Triceps Skinfold  32 mm    % Body Fat  31.1 %    Grip Strength  27 kg    Flexibility  7 in    Single Leg Stand  2.56 seconds        Nutrition Therapy Plan and Nutrition Goals:   Nutrition Assessments:   Nutrition Goals Re-Evaluation:   Nutrition Goals Re-Evaluation:   Nutrition Goals Discharge (Final Nutrition Goals Re-Evaluation):   Psychosocial: Target Goals: Acknowledge presence or absence of significant depression and/or stress, maximize coping skills, provide positive support system. Participant is able to verbalize types and ability to use techniques and skills needed for reducing stress and depression.  Initial Review & Psychosocial Screening: Initial Psych Review & Screening - 04/18/18 1343      Initial Review   Current issues with  Current Stress Concerns    Source of Stress Concerns  Chronic Illness      Family Dynamics   Good Support System?  Yes   Josph Macho has his wife for support   Comments  Josph Macho says he worried about partcipating in rehab again as he previously partcipated in phase 2 cardiac rehab in [place]      Barriers   Psychosocial barriers to participate in program  The patient should benefit from training in stress management and relaxation.      Screening Interventions   Interventions  Encouraged to exercise;To provide support and resources with identified psychosocial needs    Expected Outcomes  Long Term Goal: Stressors or current issues are controlled or  eliminated.       Quality of Life Scores: Quality of Life - 04/18/18 1348      Quality of Life   Select  Quality of Life      Quality of Life Scores   Health/Function Pre  19.39 %    Socioeconomic Pre  25.83 %  Psych/Spiritual Pre  28.93 %    Family Pre  27.6 %    GLOBAL Pre  23.97 %      Scores of 19 and below usually indicate a poorer quality of life in these areas.  A difference of  2-3 points is a clinically meaningful difference.  A difference of 2-3 points in the total score of the Quality of Life Index has been associated with significant improvement in overall quality of life, self-image, physical symptoms, and general health in studies assessing change in quality of life.  PHQ-9: Recent Review Flowsheet Data    There is no flowsheet data to display.     Interpretation of Total Score  Total Score Depression Severity:  1-4 = Minimal depression, 5-9 = Mild depression, 10-14 = Moderate depression, 15-19 = Moderately severe depression, 20-27 = Severe depression   Psychosocial Evaluation and Intervention:   Psychosocial Re-Evaluation:   Psychosocial Discharge (Final Psychosocial Re-Evaluation):   Vocational Rehabilitation: Provide vocational rehab assistance to qualifying candidates.   Vocational Rehab Evaluation & Intervention: Vocational Rehab - 04/18/18 1350      Initial Vocational Rehab Evaluation & Intervention   Assessment shows need for Vocational Rehabilitation  No   Josph Macho is retired and does not need vocational rehab for job retraining      Education: Education Goals: Education classes will be provided on a weekly basis, covering required topics. Participant will state understanding/return demonstration of topics presented.  Learning Barriers/Preferences: Learning Barriers/Preferences - 04/18/18 1514      Learning Barriers/Preferences   Learning Barriers  Hearing    Learning Preferences  Written Material;Skilled Demonstration;Verbal Instruction        Education Topics: Count Your Pulse:  -Group instruction provided by verbal instruction, demonstration, patient participation and written materials to support subject.  Instructors address importance of being able to find your pulse and how to count your pulse when at home without a heart monitor.  Patients get hands on experience counting their pulse with staff help and individually.   Heart Attack, Angina, and Risk Factor Modification:  -Group instruction provided by verbal instruction, video, and written materials to support subject.  Instructors address signs and symptoms of angina and heart attacks.    Also discuss risk factors for heart disease and how to make changes to improve heart health risk factors.   Functional Fitness:  -Group instruction provided by verbal instruction, demonstration, patient participation, and written materials to support subject.  Instructors address safety measures for doing things around the house.  Discuss how to get up and down off the floor, how to pick things up properly, how to safely get out of a chair without assistance, and balance training.   Meditation and Mindfulness:  -Group instruction provided by verbal instruction, patient participation, and written materials to support subject.  Instructor addresses importance of mindfulness and meditation practice to help reduce stress and improve awareness.  Instructor also leads participants through a meditation exercise.    Stretching for Flexibility and Mobility:  -Group instruction provided by verbal instruction, patient participation, and written materials to support subject.  Instructors lead participants through series of stretches that are designed to increase flexibility thus improving mobility.  These stretches are additional exercise for major muscle groups that are typically performed during regular warm up and cool down.   Hands Only CPR:  -Group verbal, video, and participation provides  a basic overview of AHA guidelines for community CPR. Role-play of emergencies allow participants the opportunity to practice calling for  help and chest compression technique with discussion of AED use.   Hypertension: -Group verbal and written instruction that provides a basic overview of hypertension including the most recent diagnostic guidelines, risk factor reduction with self-care instructions and medication management.    Nutrition I class: Heart Healthy Eating:  -Group instruction provided by PowerPoint slides, verbal discussion, and written materials to support subject matter. The instructor gives an explanation and review of the Therapeutic Lifestyle Changes diet recommendations, which includes a discussion on lipid goals, dietary fat, sodium, fiber, plant stanol/sterol esters, sugar, and the components of a well-balanced, healthy diet.   Nutrition II class: Lifestyle Skills:  -Group instruction provided by PowerPoint slides, verbal discussion, and written materials to support subject matter. The instructor gives an explanation and review of label reading, grocery shopping for heart health, heart healthy recipe modifications, and ways to make healthier choices when eating out.   Diabetes Question & Answer:  -Group instruction provided by PowerPoint slides, verbal discussion, and written materials to support subject matter. The instructor gives an explanation and review of diabetes co-morbidities, pre- and post-prandial blood glucose goals, pre-exercise blood glucose goals, signs, symptoms, and treatment of hypoglycemia and hyperglycemia, and foot care basics.   Diabetes Blitz:  -Group instruction provided by PowerPoint slides, verbal discussion, and written materials to support subject matter. The instructor gives an explanation and review of the physiology behind type 1 and type 2 diabetes, diabetes medications and rational behind using different medications, pre- and post-prandial  blood glucose recommendations and Hemoglobin A1c goals, diabetes diet, and exercise including blood glucose guidelines for exercising safely.    Portion Distortion:  -Group instruction provided by PowerPoint slides, verbal discussion, written materials, and food models to support subject matter. The instructor gives an explanation of serving size versus portion size, changes in portions sizes over the last 20 years, and what consists of a serving from each food group.   Stress Management:  -Group instruction provided by verbal instruction, video, and written materials to support subject matter.  Instructors review role of stress in heart disease and how to cope with stress positively.     Exercising on Your Own:  -Group instruction provided by verbal instruction, power point, and written materials to support subject.  Instructors discuss benefits of exercise, components of exercise, frequency and intensity of exercise, and end points for exercise.  Also discuss use of nitroglycerin and activating EMS.  Review options of places to exercise outside of rehab.  Review guidelines for sex with heart disease.   Cardiac Drugs I:  -Group instruction provided by verbal instruction and written materials to support subject.  Instructor reviews cardiac drug classes: antiplatelets, anticoagulants, beta blockers, and statins.  Instructor discusses reasons, side effects, and lifestyle considerations for each drug class.   Cardiac Drugs II:  -Group instruction provided by verbal instruction and written materials to support subject.  Instructor reviews cardiac drug classes: angiotensin converting enzyme inhibitors (ACE-I), angiotensin II receptor blockers (ARBs), nitrates, and calcium channel blockers.  Instructor discusses reasons, side effects, and lifestyle considerations for each drug class.   Anatomy and Physiology of the Circulatory System:  Group verbal and written instruction and models provide basic  cardiac anatomy and physiology, with the coronary electrical and arterial systems. Review of: AMI, Angina, Valve disease, Heart Failure, Peripheral Artery Disease, Cardiac Arrhythmia, Pacemakers, and the ICD.   Other Education:  -Group or individual verbal, written, or video instructions that support the educational goals of the cardiac rehab program.   [place]  Eating Survival Tips:  -Group instruction provided by PowerPoint slides, verbal discussion, and written materials to support subject matter. The instructor gives patients tips, tricks, and techniques to help them not only survive but enjoy the holidays despite the onslaught of food that accompanies the holidays.   Knowledge Questionnaire Score: Knowledge Questionnaire Score - 04/18/18 1347      Knowledge Questionnaire Score   Pre Score  20/24       Core Components/Risk Factors/Patient Goals at Admission: Personal Goals and Risk Factors at Admission - 04/18/18 1514      Core Components/Risk Factors/Patient Goals on Admission    Weight Management  Yes;Weight Loss    Intervention  Weight Management: Develop a combined nutrition and exercise program designed to reach desired caloric intake, while maintaining appropriate intake of nutrient and fiber, sodium and fats, and appropriate energy expenditure required for the weight goal.;Weight Management: Provide education and appropriate resources to help participant work on and attain dietary goals.;Weight Management/Obesity: Establish reasonable short term and long term weight goals.    Admit Weight  149 lb 0.5 oz (67.6 kg)    Goal Weight: Short Term  145 lb (65.8 kg)    Goal Weight: Long Term  140 lb (63.5 kg)    Expected Outcomes  Short Term: Continue to assess and modify interventions until short term weight is achieved;Long Term: Adherence to nutrition and physical activity/exercise program aimed toward attainment of established weight goal;Weight Maintenance: Understanding of the  daily nutrition guidelines, which includes 25-35% calories from fat, 7% or less cal from saturated fats, less than 200mg  cholesterol, less than 1.5gm of sodium, & 5 or more servings of fruits and vegetables daily;Weight Loss: Understanding of general recommendations for a balanced deficit meal plan, which promotes 1-2 lb weight loss per week and includes a negative energy balance of [phone] kcal/d;Understanding recommendations for meals to include 15-35% energy as protein, 25-35% energy from fat, 35-60% energy from carbohydrates, less than 200mg  of dietary cholesterol, 20-35 gm of total fiber daily;Understanding of distribution of calorie intake throughout the day with the consumption of 4-5 meals/snacks    Diabetes  Yes    Intervention  Provide education about signs/symptoms and action to take for hypo/hyperglycemia.;Provide education about proper nutrition, including hydration, and aerobic/resistive exercise prescription along with prescribed medications to achieve blood glucose in normal ranges: Fasting glucose 65-99 mg/dL    Expected Outcomes  Short Term: Participant verbalizes understanding of the signs/symptoms and immediate care of hyper/hypoglycemia, proper foot care and importance of medication, aerobic/resistive exercise and nutrition plan for blood glucose control.;Long Term: Attainment of HbA1C < 7%.    Heart Failure  Yes    Intervention  Provide a combined exercise and nutrition program that is supplemented with education, support and counseling about heart failure. Directed toward relieving symptoms such as shortness of breath, decreased exercise tolerance, and extremity edema.    Expected Outcomes  Improve functional capacity of life;Short term: Attendance in program 2-3 days a week with increased exercise capacity. Reported lower sodium intake. Reported increased fruit and vegetable intake. Reports medication compliance.;Short term: Daily weights obtained and reported for increase. Utilizing  diuretic protocols set by physician.;Long term: Adoption of self-care skills and reduction of barriers for early signs and symptoms recognition and intervention leading to self-care maintenance.    Hypertension  Yes    Intervention  Provide education on lifestyle modifcations including regular physical activity/exercise, weight management, moderate sodium restriction and increased consumption of fresh fruit, vegetables, and low fat dairy, alcohol  moderation, and smoking cessation.;Monitor prescription use compliance.    Expected Outcomes  Short Term: Continued assessment and intervention until BP is < 140/54mm HG in hypertensive participants. < 130/31mm HG in hypertensive participants with diabetes, heart failure or chronic kidney disease.;Long Term: Maintenance of blood pressure at goal levels.    Lipids  Yes    Intervention  Provide education and support for participant on nutrition & aerobic/resistive exercise along with prescribed medications to achieve LDL 70mg , HDL >40mg .    Expected Outcomes  Long Term: Cholesterol controlled with medications as prescribed, with individualized exercise RX and with personalized nutrition plan. Value goals: LDL < 70mg , HDL > 40 mg.;Short Term: Participant states understanding of desired cholesterol values and is compliant with medications prescribed. Participant is following exercise prescription and nutrition guidelines.       Core Components/Risk Factors/Patient Goals Review:    Core Components/Risk Factors/Patient Goals at Discharge (Final Review):    ITP Comments: ITP Comments    Row Name 04/18/18 1337           ITP Comments  Dr. Fransico Him, Medical Director          Comments: Patient attended orientation from 1341 to 1525 to review rules and guidelines for program. Completed 6 minute walk test, Intitial ITP, and exercise prescription.  VSS. Telemetry-SR with frequent ventricular bigeminy and nonsustained multifocal runs of PVC's.   Asymptomatic.  MD notified and no new orders received.

## 2018-04-18 NOTE — Telephone Encounter (Signed)
-----   Message from Brunetta Genera, MD sent at 04/18/2018  4:20 PM EST ----- Regarding: RE: Cardiac Rehab No specific restrictions from CLL standpoint other than standard infection precautions. GK ----- Message ----- From: Rowe Pavy, RN Sent: 04/17/2018  10:05 AM EST To: Brunetta Genera, MD Subject: Cardiac Rehab                                   [name],  The above patient has been referred to cardiac rehab s/p TAVR.  Pt is followed by you and was seen on 03/27/18.  At this point in his recovery, any precautions we should observe during exercise?  Thank you for your advisement, we are looking forward to working with him in rehab.  Cherre Huger, BSN Cardiac and Training and development officer

## 2018-04-18 NOTE — Progress Notes (Signed)
Austin Lloyd 85 y.o. male DOB: 1929/12/10 MRN: [id number]      Nutrition Note  1. S/P TAVR (transcatheter aortic valve replacement)    Past Medical History:  Diagnosis Date  . Aortic stenosis   . Bladder cancer ([place])    T1 status post BCG  . 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 Coronary artery disease with history of myocardial infarction without history of CABG    LIMA to the LAD, SVG to RCA 2016 for treatment of the percent left main stenosis and 80% RCA stenosis.    . Depression   . Diabetes mellitus, type II ([place])   . Hypertension   . Ischemic cardiomyopathy    EF 40% 2019  . Lung cancer ([place])    Adenocarcinoma of the right lung moderately differentiated  . Lung mass   . PVC's (premature ventricular contractions)   . S/P TAVR (transcatheter aortic valve replacement)    Meds reviewed.   Current Outpatient Medications (Endocrine & Metabolic):  .  insulin detemir (LEVEMIR) 100 UNIT/ML injection, Inject 0.31 mLs (31 Units total) into the skin daily.  Current Outpatient Medications (Cardiovascular):  .  furosemide (LASIX) 40 MG tablet, Take 1 tablet (40 mg total) by mouth daily. Marland Kitchen  losartan (COZAAR) 25 MG tablet, Take 25 mg by mouth daily. Marland Kitchen  lovastatin (MEVACOR) 40 MG tablet, Take 40 mg by mouth at bedtime. .  metoprolol succinate (TOPROL-XL) 25 MG 24 hr tablet, Take 12.5 mg by mouth daily.  Current Outpatient Medications (Respiratory):  .  benzonatate (TESSALON) 100 MG capsule, Take 1 capsule (100 mg total) by mouth 3 (three) times daily as needed. (Patient not taking: Reported on 04/11/2018)  Current Outpatient Medications (Analgesics):  .  aspirin EC 81 MG tablet, Take 81 mg by mouth every evening.   Current Outpatient Medications (Hematological):  .  clopidogrel (PLAVIX) 75 MG tablet, Take 1 tablet (75 mg total) by mouth daily with breakfast. .  ferrous sulfate 325 (65 FE) MG tablet, Take 325  mg by mouth daily with breakfast.  Current Outpatient Medications (Other):  Marland Kitchen  ACCU-CHEK SOFTCLIX LANCETS lancets, Test blood sugar three times a day.  DX E11.9 .  glucose blood (ACCU-CHEK AVIVA PLUS) test strip, Test blood sugar three times a day.  DX E11.9 .  Insulin Pen Needle 31G X 8 MM MISC, To use with insulin .  Multiple Vitamin (MULTIVITAMIN) tablet, Take 1 tablet by mouth daily. .  tamsulosin (FLOMAX) 0.4 MG CAPS capsule, Take 1 capsule (0.4 mg total) by mouth daily. .  traZODone (DESYREL) 50 MG tablet, Take 0.5-1 tablets (25-50 mg total) by mouth at bedtime as needed for sleep. Marland Kitchen  azithromycin (ZITHROMAX) 250 MG tablet, 2 tabs (500mg ) on Day 1 and then 250mg  po daily for 4 days (Patient not taking: Reported on 04/11/2018) .  Melatonin 5 MG TABS, Take 5 mg by mouth at bedtime as needed (for sleep).   HT: Ht Readings from Last 1 Encounters:  04/18/18 5' 3.75" (1.619 m)    WT: Wt Readings from Last 5 Encounters:  04/18/18 149 lb 0.5 oz (67.6 kg)  03/28/18 148 lb (67.1 kg)  03/27/18 147 lb 11.2 oz (67 kg)  03/07/18 150 lb 12.8 oz (68.4 kg)  02/20/18 147 lb 12.8 oz (67 kg)     Body mass index is 25.78 kg/m.   Current tobacco use? No  Labs:  Lipid Panel     Component Value Date/Time  CHOL 183 11/06/2017 0815   TRIG 86 11/06/2017 0815   HDL 58 11/06/2017 0815   CHOLHDL 3.2 11/06/2017 0815   LDLCALC 108 (H) 11/06/2017 0815    Lab Results  Component Value Date   HGBA1C 7.1 (H) 02/01/2018   CBG (last 3)  No results for input(s): GLUCAP in the last 72 hours.  Nutrition Note Spoke with pt. Nutrition plan and goals reviewed with pt. Pt is following Step 2 of the Therapeutic Lifestyle Changes diet. Pt wants to maintain his weight, pt shared that he likes to keep his weight within a 10 lb window, between 145-155 lbs. Heart healthy diabetic eating tips reviewed today with patient (label reading, how to build a healthy plate, portion sizes, carbohydrate counting, eating  frequently across the day). As pt is diabetic discussed the differences between complex and refined carbs, recommended pt replace refined carbs with complex. Reviewed the benefits swapping in complex carbs and moderating portion sizes can have on managing blood glucose with patient. Last A1c indicates blood glucose well-controlled for age, 7.1 (02/01/18). Pt checks CBG's 3 times a day. Fasting CBG's reportedly 120-150 mg/dL. Per discussion, pt does not use canned/convenience foods often. Pt does not add salt to food. Pt does not eat out frequently. Pt expressed understanding of the information reviewed. Pt aware of nutrition education classes offered and would like to attend nutrition classes.  Nutrition Diagnosis ? Food-and nutrition-related knowledge deficit related to lack of exposure to information as related to diagnosis of: ? CVD  ? Type 2 Diabetes  Nutrition Intervention ? Pt's individual nutrition plan and goals reviewed with pt. ? Pt given handouts for: ? Nutrition I class ? Nutrition II class  Nutrition Goal(s):  ? Pt to identify and limit food sources of saturated fat, trans fat, refined carbohydrates and sodium ? Pt to build a healthy plate including vegetables, fruits, whole grains, and low-fat dairy products in a heart healthy meal plan. ? Pt able to name foods that affect blood glucose.  Plan:  ? Pt to attend nutrition classes:  ? Nutrition I ? Nutrition II ? Portion Distortion  ? Will provide client-centered nutrition education as part of interdisciplinary care ? Monitor and evaluate progress toward nutrition goal with team.   Laurina Bustle, MS, RD, LDN 04/18/2018 3:24 PM

## 2018-04-18 NOTE — Telephone Encounter (Signed)
New message    [facility] nurse from [place] [facility] calling to send over strips. Pt is asystematic

## 2018-04-18 NOTE — Telephone Encounter (Signed)
Spoke with Verdis Frederickson with Cardiac rehab. Patient at rehab, blood pressure 128/60, HR 74 and walked 894 feet. Rhythm strips showed multifocal (nonsustained runs) unifocal bigeminy, patient asymptomatic. Strips reviewed by Dr Percival Spanish and no changes recommended. Advised Sarah at cardiac rehab.

## 2018-04-21 ENCOUNTER — Encounter: Payer: Self-pay | Admitting: Family Medicine

## 2018-04-21 DIAGNOSIS — J189 Pneumonia, unspecified organism: Secondary | ICD-10-CM

## 2018-04-22 MED ORDER — BENZONATATE 100 MG PO CAPS: 100.0000 mg | ORAL_CAPSULE | Freq: Three times a day (TID) | ORAL | 0 refills | Status: DC | PRN

## 2018-04-24 ENCOUNTER — Other Ambulatory Visit: Payer: Self-pay | Admitting: Cardiology

## 2018-04-24 ENCOUNTER — Encounter (HOSPITAL_COMMUNITY)
Admission: RE | Admit: 2018-04-24 | Discharge: 2018-04-24 | Disposition: A | Discharge status: Home / Self Care | Payer: Medicare Other | Source: Ambulatory Visit | Attending: Cardiology | Admitting: Cardiology

## 2018-04-24 ENCOUNTER — Ambulatory Visit (HOSPITAL_COMMUNITY): Payer: Medicare Other

## 2018-04-24 DIAGNOSIS — Z952 Presence of prosthetic heart valve: Secondary | ICD-10-CM

## 2018-04-24 NOTE — Progress Notes (Signed)
Daily Session Note  Patient Details  Name: Austin Lloyd MRN: [id number] Date of Birth: [birth year] Referring Provider:     CARDIAC REHAB PHASE II ORIENTATION from 04/18/2018 in [place]  Referring Provider  [name] MD      Encounter Date: 04/24/2018  Check In: Session Check In - 04/24/18 1216      Check-In   Supervising physician immediately available to respond to emergencies  [facility] immediately available    Physician(s)  Dr. Teryl Lucy     Location  [facility]    Staff Present  Barnet Pall, RN, Mosie Epstein, MS,ACSM CEP, Exercise Physiologist;Vedha Tercero Karle Starch, RN, BSN;Brittany Durene Fruits, BS, ACSM CEP, Exercise Physiologist;Joann Rion, RN, BSN    Medication changes reported      No    Fall or balance concerns reported     No    Tobacco Cessation  No Change    Warm-up and Cool-down  Performed as group-led instruction    Resistance Training Performed  No    VAD Patient?  No    PAD/SET Patient?  No      Pain Assessment   Currently in Pain?  No/denies    Multiple Pain Sites  No       Capillary Blood Glucose: No results found for this or any previous visit (from the past 24 hour(s)).  Exercise Prescription Changes - 04/24/18 1400      Response to Exercise   Blood Pressure (Admit)  122/80    Blood Pressure (Exercise)  142/70    Blood Pressure (Exit)  112/60    Heart Rate (Admit)  69 bpm    Heart Rate (Exercise)  111 bpm    Heart Rate (Exit)  67 bpm    Rating of Perceived Exertion (Exercise)  13    Perceived Dyspnea (Exercise)  0    Symptoms  None    Comments  Pt oriented to exercise equipment     Duration  Progress to 30 minutes of  aerobic without signs/symptoms of physical distress    Intensity  THRR unchanged      Progression   Progression  Continue to progress workloads to maintain intensity without signs/symptoms of physical distress.    Average METs  2.3      Resistance Training   Training  Prescription  No      NuStep   Level  1    SPM  75    Minutes  10    METs  1.7      Arm Ergometer   Level  1    Watts  5    Minutes  10    METs  1.37      Track   Laps  7    Minutes  10    METs  2.23       Social History   Tobacco Use  Smoking Status Former Smoker  . Packs/day: 0.20  . Years: 30.00  . Pack years: 6.00  . Types: Cigarettes  Smokeless Tobacco Never Used    Goals Met:  Exercise tolerated well  Goals Unmet:  Not Applicable  Comments: Pt started cardiac rehab today.  Pt tolerated light exercise without difficulty. VSS, telemetry-SR with frequent multifocal PVC's, asymptomatic.  Medication list reconciled. Pt denies barriers to medicaiton compliance.  PSYCHOSOCIAL ASSESSMENT:  PHQ-0. Pt exhibits positive coping skills, hopeful outlook with supportive family. No psychosocial needs identified at this time, no psychosocial interventions necessary.   Pt  oriented to exercise equipment and routine.    Understanding verbalized.   Dr. Fransico Him is Medical Director for Cardiac Rehab at [facility].

## 2018-04-25 NOTE — Progress Notes (Signed)
Cardiac Individual Treatment Plan  Patient Details  Name: Austin Lloyd MRN: [id number] Date of Birth: 07/30/1929 Referring Provider:     CARDIAC REHAB PHASE II ORIENTATION from 04/18/2018 in [facility]  Referring Provider  [name] MD      Initial Encounter Date:    CARDIAC REHAB PHASE II ORIENTATION from 04/18/2018 in [facility]  Date  04/18/18      Visit Diagnosis: S/P TAVR (transcatheter aortic valve replacement)  Patient's Home Medications on Admission:  Current Outpatient Medications:  .  ACCU-CHEK SOFTCLIX LANCETS lancets, Test blood sugar three times a day.  DX E11.9, Disp: 200 each, Rfl: 6 .  aspirin EC 81 MG tablet, Take 81 mg by mouth every evening. , Disp: , Rfl:  .  benzonatate (TESSALON) 100 MG capsule, Take 1 capsule (100 mg total) by mouth 3 (three) times daily as needed., Disp: 30 capsule, Rfl: 0 .  clopidogrel (PLAVIX) 75 MG tablet, Take 1 tablet (75 mg total) by mouth daily with breakfast., Disp: 90 tablet, Rfl: 1 .  ferrous sulfate 325 (65 FE) MG tablet, Take 325 mg by mouth daily with breakfast., Disp: , Rfl:  .  furosemide (LASIX) 40 MG tablet, Take 1 tablet (40 mg total) by mouth daily., Disp: 90 tablet, Rfl: 3 .  glucose blood (ACCU-CHEK AVIVA PLUS) test strip, Test blood sugar three times a day.  DX E11.9, Disp: 200 each, Rfl: 6 .  insulin detemir (LEVEMIR) 100 UNIT/ML injection, Inject 0.31 mLs (31 Units total) into the skin daily., Disp: 10 mL, Rfl: 3 .  Insulin Pen Needle 31G X 8 MM MISC, To use with insulin, Disp: 100 each, Rfl: 3 .  losartan (COZAAR) 25 MG tablet, Take 25 mg by mouth daily., Disp: , Rfl:  .  lovastatin (MEVACOR) 40 MG tablet, Take 40 mg by mouth at bedtime., Disp: , Rfl:  .  Melatonin 5 MG TABS, Take 5 mg by mouth at bedtime as needed (for sleep). , Disp: , Rfl:  .  metoprolol succinate (TOPROL-XL) 25 MG 24 hr tablet, Take 12.5 mg by mouth daily., Disp: , Rfl:   .  Multiple Vitamin (MULTIVITAMIN) tablet, Take 1 tablet by mouth daily., Disp: , Rfl:  .  tamsulosin (FLOMAX) 0.4 MG CAPS capsule, Take 1 capsule (0.4 mg total) by mouth daily., Disp: 90 capsule, Rfl: 3 .  traZODone (DESYREL) 50 MG tablet, Take 0.5-1 tablets (25-50 mg total) by mouth at bedtime as needed for sleep., Disp: 30 tablet, Rfl: 3 .  azithromycin (ZITHROMAX) 250 MG tablet, 2 tabs (500mg ) on Day 1 and then 250mg  po daily for 4 days (Patient not taking: Reported on 04/11/2018), Disp: 6 tablet, Rfl: 0  Past Medical History: Past Medical History:  Diagnosis Date  . Aortic stenosis   . 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 Coronary artery disease with history of myocardial infarction without history of CABG    LIMA to the LAD, SVG to RCA 2016 for treatment of the percent left main stenosis and 80% RCA stenosis.    . Depression   . Diabetes mellitus, type II ([place])   . Hypertension   . Ischemic cardiomyopathy    EF 40% 2019  . 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 Lung mass   . PVC's (  premature ventricular contractions)   . S/P TAVR (transcatheter aortic valve replacement)     Tobacco Use: Social History   Tobacco Use  Smoking Status Former Smoker  . Packs/day: 0.20  . Years: 30.00  . Pack years: 6.00  . Types: Cigarettes  Smokeless Tobacco Never Used    Labs: Recent Review Flowsheet Data    Labs for ITP Cardiac and Pulmonary Rehab Latest Ref Rng & Units 02/01/2018 02/05/2018 02/05/2018 02/05/2018 02/05/2018   Cholestrol 100 - 199 mg/dL - - - - -   LDLCALC 0 - 99 mg/dL - - - - -   HDL >39 mg/dL - - - - -   Trlycerides 0 - 149 mg/dL - - - - -   Hemoglobin A1c 4.8 - 5.6 % 7.1(H) - - - -   PHART 7.350 - 7.450 7.442 - - - -   PCO2ART 32.0 - 48.0 mmHg 35.8 - - - -   HCO3 20.0 - 28.0 mmol/L 24.0 - - - -   TCO2  22 - 32 mmol/L - 27 27 28 24    ACIDBASEDEF 0.0 - 2.0 mmol/L - - - - -   O2SAT % 94.9 - - - -      Capillary Blood Glucose: Lab Results  Component Value Date   GLUCAP 307 (H) 02/06/2018   GLUCAP 152 (H) 02/06/2018   GLUCAP 176 (H) 02/05/2018   GLUCAP 141 (H) 02/05/2018   GLUCAP 164 (H) 02/05/2018     Exercise Target Goals: Exercise Program Goal: Individual exercise prescription set using results from initial 6 min walk test and THRR while considering  patient's activity barriers and safety.   Exercise Prescription Goal: Initial exercise prescription builds to 30-45 minutes a day of aerobic activity, 2-3 days per week.  Home exercise guidelines will be given to patient during program as part of exercise prescription that the participant will acknowledge.  Activity Barriers & Risk Stratification: Activity Barriers & Cardiac Risk Stratification - 04/18/18 1513      Activity Barriers & Cardiac Risk Stratification   Activity Barriers  None    Cardiac Risk Stratification  High       6 Minute Walk: 6 Minute Walk    Row Name 04/18/18 1512         6 Minute Walk   Phase  Initial     Distance  897 feet     Walk Time  6 minutes     # of Rest Breaks  0     MPH  1.7     METS  1.3     RPE  12     Perceived Dyspnea   0     VO2 Peak  4.54     Symptoms  No     Resting HR  74 bpm     Resting BP  128/60     Resting Oxygen Saturation   97 %     Exercise Oxygen Saturation  during 6 min walk  95 %     Max Ex. HR  98 bpm     Max Ex. BP  126/74     2 Minute Post BP  122/70        Oxygen Initial Assessment:   Oxygen Re-Evaluation:   Oxygen Discharge (Final Oxygen Re-Evaluation):   Initial Exercise Prescription: Initial Exercise Prescription - 04/18/18 1400      Date of Initial Exercise RX and Referring Provider   Date  04/18/18    Referring Provider  [name] MD    Expected Discharge Date  07/26/18      NuStep   Level  1    SPM  75    Minutes  10    METs   1.5      Arm Ergometer   Level  1    Watts  5    Minutes  10    METs  1.37      Track   Laps  6    Minutes  10    METs  2      Prescription Details   Frequency (times per week)  3x    Duration  Progress to 30 minutes of continuous aerobic without signs/symptoms of physical distress      Intensity   THRR 40-80% of Max Heartrate  54-107    Ratings of Perceived Exertion  11-13    Perceived Dyspnea  0-4      Progression   Progression  Continue progressive overload as per policy without signs/symptoms or physical distress.      Resistance Training   Training Prescription  Yes    Weight  2lbs    Reps  10-15       Perform Capillary Blood Glucose checks as needed.  Exercise Prescription Changes: Exercise Prescription Changes    Row Name 04/24/18 1400             Response to Exercise   Blood Pressure (Admit)  122/80       Blood Pressure (Exercise)  142/70       Blood Pressure (Exit)  112/60       Heart Rate (Admit)  69 bpm       Heart Rate (Exercise)  111 bpm       Heart Rate (Exit)  67 bpm       Rating of Perceived Exertion (Exercise)  13       Perceived Dyspnea (Exercise)  0       Symptoms  None       Comments  Pt oriented to exercise equipment        Duration  Progress to 30 minutes of  aerobic without signs/symptoms of physical distress       Intensity  THRR unchanged         Progression   Progression  Continue to progress workloads to maintain intensity without signs/symptoms of physical distress.       Average METs  2.3         Resistance Training   Training Prescription  No         NuStep   Level  1       SPM  75       Minutes  10       METs  1.7         Arm Ergometer   Level  1       Watts  5       Minutes  10       METs  1.37         Track   Laps  7       Minutes  10       METs  2.23          Exercise Comments: Exercise Comments    Row Name 04/24/18 1436           Exercise Comments  Pt's first day of exericse. Pt tolerated  exercise well. Will continue to monitor  and progress pt as tolerated.           Exercise Goals and Review: Exercise Goals    Row Name 04/18/18 1338             Exercise Goals   Increase Physical Activity  Yes       Intervention  Provide advice, education, support and counseling about physical activity/exercise needs.;Develop an individualized exercise prescription for aerobic and resistive training based on initial evaluation findings, risk stratification, comorbidities and participant's personal goals.       Expected Outcomes  Long Term: Exercising regularly at least 3-5 days a week.;Short Term: Attend rehab on a regular basis to increase amount of physical activity.;Long Term: Add in home exercise to make exercise part of routine and to increase amount of physical activity.       Increase Strength and Stamina  Yes       Intervention  Provide advice, education, support and counseling about physical activity/exercise needs.;Develop an individualized exercise prescription for aerobic and resistive training based on initial evaluation findings, risk stratification, comorbidities and participant's personal goals.       Expected Outcomes  Short Term: Increase workloads from initial exercise prescription for resistance, speed, and METs.;Short Term: Perform resistance training exercises routinely during rehab and add in resistance training at home;Long Term: Improve cardiorespiratory fitness, muscular endurance and strength as measured by increased METs and functional capacity (6MWT)       Able to understand and use rate of perceived exertion (RPE) scale  Yes       Intervention  Provide education and explanation on how to use RPE scale       Expected Outcomes  Short Term: Able to use RPE daily in rehab to express subjective intensity level;Long Term:  Able to use RPE to guide intensity level when exercising independently       Knowledge and understanding of Target Heart Rate Range (THRR)  Yes        Intervention  Provide education and explanation of THRR including how the numbers were predicted and where they are located for reference       Expected Outcomes  Short Term: Able to state/look up THRR;Long Term: Able to use THRR to govern intensity when exercising independently;Short Term: Able to use daily as guideline for intensity in rehab       Able to check pulse independently  Yes       Intervention  Provide education and demonstration on how to check pulse in carotid and radial arteries.;Review the importance of being able to check your own pulse for safety during independent exercise       Expected Outcomes  Short Term: Able to explain why pulse checking is important during independent exercise;Long Term: Able to check pulse independently and accurately       Understanding of Exercise Prescription  Yes       Intervention  Provide education, explanation, and written materials on patient's individual exercise prescription       Expected Outcomes  Short Term: Able to explain program exercise prescription;Long Term: Able to explain home exercise prescription to exercise independently          Exercise Goals Re-Evaluation :   Discharge Exercise Prescription (Final Exercise Prescription Changes): Exercise Prescription Changes - 04/24/18 1400      Response to Exercise   Blood Pressure (Admit)  122/80    Blood Pressure (Exercise)  142/70    Blood Pressure (Exit)  112/60    Heart  Rate (Admit)  69 bpm    Heart Rate (Exercise)  111 bpm    Heart Rate (Exit)  67 bpm    Rating of Perceived Exertion (Exercise)  13    Perceived Dyspnea (Exercise)  0    Symptoms  None    Comments  Pt oriented to exercise equipment     Duration  Progress to 30 minutes of  aerobic without signs/symptoms of physical distress    Intensity  THRR unchanged      Progression   Progression  Continue to progress workloads to maintain intensity without signs/symptoms of physical distress.    Average METs  2.3       Resistance Training   Training Prescription  No      NuStep   Level  1    SPM  75    Minutes  10    METs  1.7      Arm Ergometer   Level  1    Watts  5    Minutes  10    METs  1.37      Track   Laps  7    Minutes  10    METs  2.23       Nutrition:  Target Goals: Understanding of nutrition guidelines, daily intake of sodium 1500mg , cholesterol 200mg , calories 30% from fat and 7% or less from saturated fats, daily to have 5 or more servings of fruits and vegetables.  Biometrics: Pre Biometrics - 04/18/18 1513      Pre Biometrics   Height  5' 3.75" (1.619 m)    Weight  67.6 kg    Waist Circumference  40 inches    Hip Circumference  39.5 inches    Waist to Hip Ratio  1.01 %    BMI (Calculated)  25.79    Triceps Skinfold  32 mm    % Body Fat  31.1 %    Grip Strength  27 kg    Flexibility  7 in    Single Leg Stand  2.56 seconds        Nutrition Therapy Plan and Nutrition Goals: Nutrition Therapy & Goals - 04/18/18 1524      Nutrition Therapy   Diet  carb modified      Personal Nutrition Goals   Nutrition Goal  Pt to identify and limit food sources of saturated fat, trans fat, refined carbohydrates and sodium    Personal Goal #2  Pt to build a healthy plate including vegetables, fruits, whole grains, and low-fat dairy products in a heart healthy meal plan    Personal Goal #3  Pt able to name foods that affect blood glucose      Intervention Plan   Intervention  Prescribe, educate and counsel regarding individualized specific dietary modifications aiming towards targeted core components such as weight, hypertension, lipid management, diabetes, heart failure and other comorbidities.    Expected Outcomes  Short Term Goal: Understand basic principles of dietary content, such as calories, fat, sodium, cholesterol and nutrients.;Long Term Goal: Adherence to prescribed nutrition plan.       Nutrition Assessments: Nutrition Assessments - 04/19/18 1045      MEDFICTS  Scores   Pre Score  21       Nutrition Goals Re-Evaluation: Nutrition Goals Re-Evaluation    [place] Name 04/18/18 1524             Goals   Current Weight  149 lb 0.5 oz (67.6 kg)  Nutrition Goals Re-Evaluation: Nutrition Goals Re-Evaluation    [place] Name 04/18/18 1524             Goals   Current Weight  149 lb 0.5 oz (67.6 kg)          Nutrition Goals Discharge (Final Nutrition Goals Re-Evaluation): Nutrition Goals Re-Evaluation - 04/18/18 1524      Goals   Current Weight  149 lb 0.5 oz (67.6 kg)       Psychosocial: Target Goals: Acknowledge presence or absence of significant depression and/or stress, maximize coping skills, provide positive support system. Participant is able to verbalize types and ability to use techniques and skills needed for reducing stress and depression.  Initial Review & Psychosocial Screening: Initial Psych Review & Screening - 04/18/18 1343      Initial Review   Current issues with  Current Stress Concerns    Source of Stress Concerns  Chronic Illness      Family Dynamics   Good Support System?  Yes   Josph Macho has his wife for support   Comments  Josph Macho says he worried about partcipating in rehab again as he previously partcipated in phase 2 cardiac rehab in [place]      Barriers   Psychosocial barriers to participate in program  The patient should benefit from training in stress management and relaxation.      Screening Interventions   Interventions  Encouraged to exercise;To provide support and resources with identified psychosocial needs    Expected Outcomes  Long Term Goal: Stressors or current issues are controlled or eliminated.       Quality of Life Scores: Quality of Life - 04/18/18 1348      Quality of Life   Select  Quality of Life      Quality of Life Scores   Health/Function Pre  19.39 %    Socioeconomic Pre  25.83 %    Psych/Spiritual Pre  28.93 %    Family Pre  27.6 %    GLOBAL Pre  23.97 %      Scores  of 19 and below usually indicate a poorer quality of life in these areas.  A difference of  2-3 points is a clinically meaningful difference.  A difference of 2-3 points in the total score of the Quality of Life Index has been associated with significant improvement in overall quality of life, self-image, physical symptoms, and general health in studies assessing change in quality of life.  PHQ-9: Recent Review Flowsheet Data    Depression screen [facility] 2/9 04/24/2018   Decreased Interest 0   Down, Depressed, Hopeless 0   PHQ - 2 Score 0     Interpretation of Total Score  Total Score Depression Severity:  1-4 = Minimal depression, 5-9 = Mild depression, 10-14 = Moderate depression, 15-19 = Moderately severe depression, 20-27 = Severe depression   Psychosocial Evaluation and Intervention: Psychosocial Evaluation - 04/24/18 1531      Psychosocial Evaluation & Interventions   Interventions  Relaxation education;Stress management education;Encouraged to exercise with the program and follow exercise prescription    Comments  Current stress concerns relating to returning to [facility]. Emotional support given.  Josph Macho likes to watch TV and build model trains.     Expected Outcomes  Josph Macho will maintain a positive outlook and learn to manage his stress.     Continue Psychosocial Services   Follow up required by staff       Psychosocial Re-Evaluation:   Psychosocial Discharge (Final  Psychosocial Re-Evaluation):   Vocational Rehabilitation: Provide vocational rehab assistance to qualifying candidates.   Vocational Rehab Evaluation & Intervention: Vocational Rehab - 04/18/18 1350      Initial Vocational Rehab Evaluation & Intervention   Assessment shows need for Vocational Rehabilitation  No   Josph Macho is retired and does not need vocational rehab for job retraining      Education: Education Goals: Education classes will be provided on a weekly basis, covering required topics. Participant will state  understanding/return demonstration of topics presented.  Learning Barriers/Preferences: Learning Barriers/Preferences - 04/18/18 1514      Learning Barriers/Preferences   Learning Barriers  Hearing    Learning Preferences  Written Material;Skilled Demonstration;Verbal Instruction       Education Topics: Count Your Pulse:  -Group instruction provided by verbal instruction, demonstration, patient participation and written materials to support subject.  Instructors address importance of being able to find your pulse and how to count your pulse when at home without a heart monitor.  Patients get hands on experience counting their pulse with staff help and individually.   Heart Attack, Angina, and Risk Factor Modification:  -Group instruction provided by verbal instruction, video, and written materials to support subject.  Instructors address signs and symptoms of angina and heart attacks.    Also discuss risk factors for heart disease and how to make changes to improve heart health risk factors.   Functional Fitness:  -Group instruction provided by verbal instruction, demonstration, patient participation, and written materials to support subject.  Instructors address safety measures for doing things around the house.  Discuss how to get up and down off the floor, how to pick things up properly, how to safely get out of a chair without assistance, and balance training.   Meditation and Mindfulness:  -Group instruction provided by verbal instruction, patient participation, and written materials to support subject.  Instructor addresses importance of mindfulness and meditation practice to help reduce stress and improve awareness.  Instructor also leads participants through a meditation exercise.    Stretching for Flexibility and Mobility:  -Group instruction provided by verbal instruction, patient participation, and written materials to support subject.  Instructors lead participants through  series of stretches that are designed to increase flexibility thus improving mobility.  These stretches are additional exercise for major muscle groups that are typically performed during regular warm up and cool down.   Hands Only CPR:  -Group verbal, video, and participation provides a basic overview of AHA guidelines for community CPR. Role-play of emergencies allow participants the opportunity to practice calling for help and chest compression technique with discussion of AED use.   Hypertension: -Group verbal and written instruction that provides a basic overview of hypertension including the most recent diagnostic guidelines, risk factor reduction with self-care instructions and medication management.    Nutrition I class: Heart Healthy Eating:  -Group instruction provided by PowerPoint slides, verbal discussion, and written materials to support subject matter. The instructor gives an explanation and review of the Therapeutic Lifestyle Changes diet recommendations, which includes a discussion on lipid goals, dietary fat, sodium, fiber, plant stanol/sterol esters, sugar, and the components of a well-balanced, healthy diet.   Nutrition II class: Lifestyle Skills:  -Group instruction provided by PowerPoint slides, verbal discussion, and written materials to support subject matter. The instructor gives an explanation and review of label reading, grocery shopping for heart health, heart healthy recipe modifications, and ways to make healthier choices when eating out.   Diabetes Question & Answer:  -  Group instruction provided by PowerPoint slides, verbal discussion, and written materials to support subject matter. The instructor gives an explanation and review of diabetes co-morbidities, pre- and post-prandial blood glucose goals, pre-exercise blood glucose goals, signs, symptoms, and treatment of hypoglycemia and hyperglycemia, and foot care basics.   Diabetes Blitz:  -Group instruction  provided by PowerPoint slides, verbal discussion, and written materials to support subject matter. The instructor gives an explanation and review of the physiology behind type 1 and type 2 diabetes, diabetes medications and rational behind using different medications, pre- and post-prandial blood glucose recommendations and Hemoglobin A1c goals, diabetes diet, and exercise including blood glucose guidelines for exercising safely.    Portion Distortion:  -Group instruction provided by PowerPoint slides, verbal discussion, written materials, and food models to support subject matter. The instructor gives an explanation of serving size versus portion size, changes in portions sizes over the last 20 years, and what consists of a serving from each food group.   Stress Management:  -Group instruction provided by verbal instruction, video, and written materials to support subject matter.  Instructors review role of stress in heart disease and how to cope with stress positively.     Exercising on Your Own:  -Group instruction provided by verbal instruction, power point, and written materials to support subject.  Instructors discuss benefits of exercise, components of exercise, frequency and intensity of exercise, and end points for exercise.  Also discuss use of nitroglycerin and activating EMS.  Review options of places to exercise outside of rehab.  Review guidelines for sex with heart disease.   Cardiac Drugs I:  -Group instruction provided by verbal instruction and written materials to support subject.  Instructor reviews cardiac drug classes: antiplatelets, anticoagulants, beta blockers, and statins.  Instructor discusses reasons, side effects, and lifestyle considerations for each drug class.   Cardiac Drugs II:  -Group instruction provided by verbal instruction and written materials to support subject.  Instructor reviews cardiac drug classes: angiotensin converting enzyme inhibitors (ACE-I),  angiotensin II receptor blockers (ARBs), nitrates, and calcium channel blockers.  Instructor discusses reasons, side effects, and lifestyle considerations for each drug class.   Anatomy and Physiology of the Circulatory System:  Group verbal and written instruction and models provide basic cardiac anatomy and physiology, with the coronary electrical and arterial systems. Review of: AMI, Angina, Valve disease, Heart Failure, Peripheral Artery Disease, Cardiac Arrhythmia, Pacemakers, and the ICD.   Other Education:  -Group or individual verbal, written, or video instructions that support the educational goals of the cardiac rehab program.   Holiday Eating Survival Tips:  -Group instruction provided by PowerPoint slides, verbal discussion, and written materials to support subject matter. The instructor gives patients tips, tricks, and techniques to help them not only survive but enjoy the holidays despite the onslaught of food that accompanies the holidays.   Knowledge Questionnaire Score: Knowledge Questionnaire Score - 04/18/18 1347      Knowledge Questionnaire Score   Pre Score  20/24       Core Components/Risk Factors/Patient Goals at Admission: Personal Goals and Risk Factors at Admission - 04/18/18 1514      Core Components/Risk Factors/Patient Goals on Admission    Weight Management  Yes;Weight Loss    Intervention  Weight Management: Develop a combined nutrition and exercise program designed to reach desired caloric intake, while maintaining appropriate intake of nutrient and fiber, sodium and fats, and appropriate energy expenditure required for the weight goal.;Weight Management: Provide education and appropriate resources to help  participant work on and attain dietary goals.;Weight Management/Obesity: Establish reasonable short term and long term weight goals.    Admit Weight  149 lb 0.5 oz (67.6 kg)    Goal Weight: Short Term  145 lb (65.8 kg)    Goal Weight: Long Term  140 lb  (63.5 kg)    Expected Outcomes  Short Term: Continue to assess and modify interventions until short term weight is achieved;Long Term: Adherence to nutrition and physical activity/exercise program aimed toward attainment of established weight goal;Weight Maintenance: Understanding of the daily nutrition guidelines, which includes 25-35% calories from fat, 7% or less cal from saturated fats, less than 200mg  cholesterol, less than 1.5gm of sodium, & 5 or more servings of fruits and vegetables daily;Weight Loss: Understanding of general recommendations for a balanced deficit meal plan, which promotes 1-2 lb weight loss per week and includes a negative energy balance of [phone] kcal/d;Understanding recommendations for meals to include 15-35% energy as protein, 25-35% energy from fat, 35-60% energy from carbohydrates, less than 200mg  of dietary cholesterol, 20-35 gm of total fiber daily;Understanding of distribution of calorie intake throughout the day with the consumption of 4-5 meals/snacks    Diabetes  Yes    Intervention  Provide education about signs/symptoms and action to take for hypo/hyperglycemia.;Provide education about proper nutrition, including hydration, and aerobic/resistive exercise prescription along with prescribed medications to achieve blood glucose in normal ranges: Fasting glucose 65-99 mg/dL    Expected Outcomes  Short Term: Participant verbalizes understanding of the signs/symptoms and immediate care of hyper/hypoglycemia, proper foot care and importance of medication, aerobic/resistive exercise and nutrition plan for blood glucose control.;Long Term: Attainment of HbA1C < 7%.    Heart Failure  Yes    Intervention  Provide a combined exercise and nutrition program that is supplemented with education, support and counseling about heart failure. Directed toward relieving symptoms such as shortness of breath, decreased exercise tolerance, and extremity edema.    Expected Outcomes  Improve  functional capacity of life;Short term: Attendance in program 2-3 days a week with increased exercise capacity. Reported lower sodium intake. Reported increased fruit and vegetable intake. Reports medication compliance.;Short term: Daily weights obtained and reported for increase. Utilizing diuretic protocols set by physician.;Long term: Adoption of self-care skills and reduction of barriers for early signs and symptoms recognition and intervention leading to self-care maintenance.    Hypertension  Yes    Intervention  Provide education on lifestyle modifcations including regular physical activity/exercise, weight management, moderate sodium restriction and increased consumption of fresh fruit, vegetables, and low fat dairy, alcohol moderation, and smoking cessation.;Monitor prescription use compliance.    Expected Outcomes  Short Term: Continued assessment and intervention until BP is < 140/28mm HG in hypertensive participants. < 130/16mm HG in hypertensive participants with diabetes, heart failure or chronic kidney disease.;Long Term: Maintenance of blood pressure at goal levels.    Lipids  Yes    Intervention  Provide education and support for participant on nutrition & aerobic/resistive exercise along with prescribed medications to achieve LDL 70mg , HDL >40mg .    Expected Outcomes  Long Term: Cholesterol controlled with medications as prescribed, with individualized exercise RX and with personalized nutrition plan. Value goals: LDL < 70mg , HDL > 40 mg.;Short Term: Participant states understanding of desired cholesterol values and is compliant with medications prescribed. Participant is following exercise prescription and nutrition guidelines.       Core Components/Risk Factors/Patient Goals Review:  Goals and Risk Factor Review    Row Name 04/24/18 1534  Core Components/Risk Factors/Patient Goals Review   Personal Goals Review  Weight Management/Obesity;Lipids;Heart  Failure;Diabetes;Hypertension       Review  Pt with multiple CAD RFs willing to participate in CR exercise.  Josph Macho would like to increase his walking speed.       Expected Outcomes  Pt will continue to participate in CR exercise, nutrition, and lifestyle modification opportunities.           Core Components/Risk Factors/Patient Goals at Discharge (Final Review):  Goals and Risk Factor Review - 04/24/18 1534      Core Components/Risk Factors/Patient Goals Review   Personal Goals Review  Weight Management/Obesity;Lipids;Heart Failure;Diabetes;Hypertension    Review  Pt with multiple CAD RFs willing to participate in CR exercise.  Josph Macho would like to increase his walking speed.    Expected Outcomes  Pt will continue to participate in CR exercise, nutrition, and lifestyle modification opportunities.        ITP Comments: ITP Comments    Row Name 04/18/18 1337 04/24/18 1531         ITP Comments  Dr. Fransico Him, Medical Director  30 Day ITP Review. Fred started exercise today and tolerated it well.          Comments: See ITP Comments.

## 2018-04-26 ENCOUNTER — Ambulatory Visit (HOSPITAL_COMMUNITY): Payer: Medicare Other

## 2018-04-26 ENCOUNTER — Encounter (HOSPITAL_COMMUNITY)
Admission: RE | Admit: 2018-04-26 | Discharge: 2018-04-26 | Disposition: A | Discharge status: Home / Self Care | Payer: Medicare Other | Source: Ambulatory Visit | Attending: Cardiology | Admitting: Cardiology

## 2018-04-26 DIAGNOSIS — Z952 Presence of prosthetic heart valve: Secondary | ICD-10-CM | POA: Diagnosis not present

## 2018-04-26 LAB — GLUCOSE, CAPILLARY
Glucose-Capillary: 244 mg/dL — ABNORMAL HIGH (ref 70–99)
Glucose-Capillary: 106 mg/dL — ABNORMAL HIGH (ref 70–99)

## 2018-04-29 ENCOUNTER — Ambulatory Visit (HOSPITAL_COMMUNITY): Payer: Medicare Other

## 2018-04-29 ENCOUNTER — Encounter (HOSPITAL_COMMUNITY)
Admission: RE | Admit: 2018-04-29 | Discharge: 2018-04-29 | Disposition: A | Discharge status: Home / Self Care | Payer: Medicare Other | Source: Ambulatory Visit | Attending: Cardiology | Admitting: Cardiology

## 2018-04-29 DIAGNOSIS — Z952 Presence of prosthetic heart valve: Secondary | ICD-10-CM | POA: Diagnosis not present

## 2018-04-29 LAB — GLUCOSE, CAPILLARY
Glucose-Capillary: 254 mg/dL — ABNORMAL HIGH (ref 70–99)
Glucose-Capillary: 104 mg/dL — ABNORMAL HIGH (ref 70–99)

## 2018-04-29 NOTE — Progress Notes (Signed)
Austin Lloyd 85 y.o. male Nutrition Note Spoke with pt. Nutrition plan and goals reviewed with pt. Pt is following heart healthy diet. Pt wants to maintain his weight, pt shared that he likes to keep his weight within a 10 lb range. Heart healthy diabetic eating tips reviewed today with patient (label reading, how to build a healthy plate, portion sizes, carbohydrate counting, eating frequently across the day). Reviewed the differences between complex and refined carbs, recommended pt replace refined carbs with complex. Reviewed the benefits swapping in complex carbs and moderating portion sizes can have on managing blood glucose with patient. Pt last A1c was 7.1. Pt presented with blood sugar 244 at cardiac rehab on Friday across exercise pt's blood sugar dropped to 106. Discussed with patient adding in a snack 1-1.5 hours before coming to exercise, reviewed what would constitute a good snack, and distributed handout to patient. Pt expressed understanding of the information reviewed. Pt aware of nutrition education classes offered and would like to attend nutrition classes.  Lab Results  Component Value Date   HGBA1C 7.1 (H) 02/01/2018    Wt Readings from Last 3 Encounters:  04/18/18 149 lb 0.5 oz (67.6 kg)  03/28/18 148 lb (67.1 kg)  03/27/18 147 lb 11.2 oz (67 kg)    Nutrition Diagnosis ? Food-and nutrition-related knowledge deficit related to lack of exposure to information as related to diagnosis of: ? CVD ? Type 2 Diabetes  Nutrition Intervention ? Pt's individual nutrition plan reviewed with pt. ? Benefits of adopting Heart Healthy diet discussed when Medficts reviewed.   ? Pt given handouts for: ? Nutrition I class ? Nutrition II class ? Diabetes Blitz Class ? Consistent vit K diet ? low sodium ? DM ? pre-diabetes ? Diabetes Q & A class ? Continue client-centered nutrition education by RD, as part of interdisciplinary care.  Goal(s) ? Pt to identify and limit food sources of  saturated fat, trans fat, refined carbohydrates and sodium ? Pt to build a healthy plate including vegetables, fruits, whole grains, and low-fat dairy products in a heart healthy meal plan.. ? Pt able to name foods that affect blood glucose  Plan:   Pt to attend nutrition classes ? Nutrition I ? Nutrition II ? Portion Distortion   Will provide client-centered nutrition education as part of interdisciplinary care  Monitor and evaluate progress toward nutrition goal with team.    Laurina Bustle, MS, RD, LDN 04/29/2018 12:07 PM

## 2018-05-01 ENCOUNTER — Encounter (HOSPITAL_COMMUNITY)
Admission: RE | Admit: 2018-05-01 | Discharge: 2018-05-01 | Disposition: A | Discharge status: Home / Self Care | Payer: Medicare Other | Source: Ambulatory Visit | Attending: Cardiology | Admitting: Cardiology

## 2018-05-01 ENCOUNTER — Ambulatory Visit (HOSPITAL_COMMUNITY): Payer: Medicare Other

## 2018-05-01 DIAGNOSIS — Z952 Presence of prosthetic heart valve: Secondary | ICD-10-CM

## 2018-05-01 NOTE — Progress Notes (Signed)
Austin Lloyd 85 y.o. male Nutrition Note Spoke with pt. Nutrition plan and goals reviewed with pt. Pt is following heart healthy diet. Heart healthy diabetic eating tips reviewed today with patient. Pt presented with blood sugar 244 at cardiac rehab on Friday  across exercise pt's blood sugar dropped to 106. The following Monday pt presented with CBG 256 that dropped to 104. Strongly recommended patient adding in a snack 1-1.5 hours before coming to exercise, reviewed what would constitute a good snack, and distributed handout to patient. Additionally recommended pt include a protein with breakfast meal. Pt expressed understanding of the information reviewed. Pt aware of nutrition education classes offered and would like to attend nutrition classes.  Lab Results  Component Value Date   HGBA1C 7.1 (H) 02/01/2018    Wt Readings from Last 3 Encounters:  04/18/18 149 lb 0.5 oz (67.6 kg)  03/28/18 148 lb (67.1 kg)  03/27/18 147 lb 11.2 oz (67 kg)    Nutrition Diagnosis ? Food-and nutrition-related knowledge deficit related to lack of exposure to information as related to diagnosis of: ? CVD ? Type 2 Diabetes  Nutrition Intervention ? Pt's individual nutrition plan reviewed with pt. ? Benefits of adopting Heart Healthy diet discussed when Medficts reviewed.   ? Pt given handouts for: ? Nutrition I class ? Nutrition II class ? Diabetes Blitz Class ? Consistent vit K diet ? low sodium ? DM ? pre-diabetes ? Diabetes Q & A class ? Continue client-centered nutrition education by RD, as part of interdisciplinary care.  Goal(s) ? Pt to identify and limit food sources of saturated fat, trans fat, refined carbohydrates and sodium ? Pt to build a healthy plate including vegetables, fruits, whole grains, and low-fat dairy products in a heart healthy meal plan.. ? Pt able to name foods that affect blood glucose ? Pt to add in a snack before exercise, and add a protein to breakfast.  Plan:   Pt  to attend nutrition classes ? Nutrition I ? Nutrition II ? Portion Distortion   Will provide client-centered nutrition education as part of interdisciplinary care  Monitor and evaluate progress toward nutrition goal with team.    Laurina Bustle, MS, RD, LDN 05/01/2018 12:07 PM

## 2018-05-03 ENCOUNTER — Encounter (HOSPITAL_COMMUNITY)
Admission: RE | Admit: 2018-05-03 | Discharge: 2018-05-03 | Disposition: A | Discharge status: Home / Self Care | Payer: Medicare Other | Source: Ambulatory Visit | Attending: Cardiology | Admitting: Cardiology

## 2018-05-03 ENCOUNTER — Ambulatory Visit (HOSPITAL_COMMUNITY): Payer: Medicare Other

## 2018-05-03 DIAGNOSIS — Z952 Presence of prosthetic heart valve: Secondary | ICD-10-CM | POA: Diagnosis not present

## 2018-05-06 ENCOUNTER — Encounter (HOSPITAL_COMMUNITY)
Admission: RE | Admit: 2018-05-06 | Discharge: 2018-05-06 | Disposition: A | Discharge status: Home / Self Care | Payer: Medicare Other | Source: Ambulatory Visit | Attending: Cardiology | Admitting: Cardiology

## 2018-05-06 ENCOUNTER — Ambulatory Visit (HOSPITAL_COMMUNITY): Payer: Medicare Other

## 2018-05-06 DIAGNOSIS — Z952 Presence of prosthetic heart valve: Secondary | ICD-10-CM | POA: Diagnosis not present

## 2018-05-08 ENCOUNTER — Ambulatory Visit (HOSPITAL_COMMUNITY): Payer: Medicare Other

## 2018-05-08 ENCOUNTER — Encounter (HOSPITAL_COMMUNITY)
Admission: RE | Admit: 2018-05-08 | Discharge: 2018-05-08 | Disposition: A | Discharge status: Home / Self Care | Payer: Medicare Other | Source: Ambulatory Visit | Attending: Cardiology | Admitting: Cardiology

## 2018-05-08 DIAGNOSIS — Z952 Presence of prosthetic heart valve: Secondary | ICD-10-CM

## 2018-05-10 ENCOUNTER — Ambulatory Visit (HOSPITAL_COMMUNITY): Payer: Medicare Other

## 2018-05-10 ENCOUNTER — Encounter (HOSPITAL_COMMUNITY)
Admission: RE | Admit: 2018-05-10 | Discharge: 2018-05-10 | Disposition: A | Discharge status: Home / Self Care | Payer: Medicare Other | Source: Ambulatory Visit | Attending: Cardiology | Admitting: Cardiology

## 2018-05-10 DIAGNOSIS — Z952 Presence of prosthetic heart valve: Secondary | ICD-10-CM

## 2018-05-13 ENCOUNTER — Encounter (HOSPITAL_COMMUNITY)
Admission: RE | Admit: 2018-05-13 | Discharge: 2018-05-13 | Disposition: A | Discharge status: Home / Self Care | Payer: Medicare Other | Source: Ambulatory Visit | Attending: Cardiology | Admitting: Cardiology

## 2018-05-13 ENCOUNTER — Ambulatory Visit (HOSPITAL_COMMUNITY): Payer: Medicare Other

## 2018-05-13 DIAGNOSIS — Z952 Presence of prosthetic heart valve: Secondary | ICD-10-CM | POA: Diagnosis not present

## 2018-05-15 ENCOUNTER — Encounter (HOSPITAL_COMMUNITY)
Admission: RE | Admit: 2018-05-15 | Discharge: 2018-05-15 | Disposition: A | Discharge status: Home / Self Care | Payer: Medicare Other | Source: Ambulatory Visit | Attending: Cardiology | Admitting: Cardiology

## 2018-05-15 ENCOUNTER — Ambulatory Visit (HOSPITAL_COMMUNITY): Payer: Medicare Other

## 2018-05-15 DIAGNOSIS — Z952 Presence of prosthetic heart valve: Secondary | ICD-10-CM | POA: Diagnosis not present

## 2018-05-16 NOTE — Progress Notes (Signed)
I have reviewed a Home Exercise Prescription with [facility] . Francesco is currently exercising at home. The patient was advised to continue to walk 4-5 days a week for 45 minutes.  Tejon and I discussed how to progress their exercise prescription. The patient stated that they understand the exercise prescription.  We reviewed exercise guidelines, target heart rate during exercise, RPE Scale, weather conditions, NTG use, endpoints for exercise, warmup and cool down.  Patient is encouraged to come to me with any questions. I will continue to follow up with the patient to assist them with progression and safety.     Carma Lair MS, ACSM CEP 4:11 PM 05/15/2018

## 2018-05-17 ENCOUNTER — Encounter (HOSPITAL_COMMUNITY)
Admission: RE | Admit: 2018-05-17 | Discharge: 2018-05-17 | Disposition: A | Discharge status: Home / Self Care | Payer: Medicare Other | Source: Ambulatory Visit | Attending: Cardiology | Admitting: Cardiology

## 2018-05-17 ENCOUNTER — Ambulatory Visit (HOSPITAL_COMMUNITY): Payer: Medicare Other

## 2018-05-17 DIAGNOSIS — Z952 Presence of prosthetic heart valve: Secondary | ICD-10-CM

## 2018-05-20 ENCOUNTER — Encounter (HOSPITAL_COMMUNITY): Payer: Medicare Other

## 2018-05-20 ENCOUNTER — Ambulatory Visit (HOSPITAL_COMMUNITY): Payer: Medicare Other

## 2018-05-22 ENCOUNTER — Encounter (HOSPITAL_COMMUNITY)
Admission: RE | Admit: 2018-05-22 | Discharge: 2018-05-22 | Disposition: A | Discharge status: Home / Self Care | Payer: Medicare Other | Source: Ambulatory Visit | Attending: Cardiology | Admitting: Cardiology

## 2018-05-22 ENCOUNTER — Ambulatory Visit (HOSPITAL_COMMUNITY): Payer: Medicare Other

## 2018-05-22 DIAGNOSIS — Z952 Presence of prosthetic heart valve: Secondary | ICD-10-CM | POA: Insufficient documentation

## 2018-05-23 NOTE — Progress Notes (Signed)
Cardiac Individual Treatment Plan  Patient Details  Name: Austin Lloyd MRN: [id number] Date of Birth: 02-26-1930 Referring Provider:     CARDIAC REHAB PHASE II ORIENTATION from 04/18/2018 in [facility]  Referring Provider  [name] MD      Initial Encounter Date:    CARDIAC REHAB PHASE II ORIENTATION from 04/18/2018 in [facility]  Date  04/18/18      Visit Diagnosis: S/P TAVR (transcatheter aortic valve replacement)  Patient's Home Medications on Admission:  Current Outpatient Medications:  .  ACCU-CHEK SOFTCLIX LANCETS lancets, Test blood sugar three times a day.  DX E11.9, Disp: 200 each, Rfl: 6 .  aspirin EC 81 MG tablet, Take 81 mg by mouth every evening. , Disp: , Rfl:  .  azithromycin (ZITHROMAX) 250 MG tablet, 2 tabs (500mg ) on Day 1 and then 250mg  po daily for 4 days (Patient not taking: Reported on 04/11/2018), Disp: 6 tablet, Rfl: 0 .  benzonatate (TESSALON) 100 MG capsule, Take 1 capsule (100 mg total) by mouth 3 (three) times daily as needed., Disp: 30 capsule, Rfl: 0 .  clopidogrel (PLAVIX) 75 MG tablet, Take 1 tablet (75 mg total) by mouth daily with breakfast., Disp: 90 tablet, Rfl: 1 .  ferrous sulfate 325 (65 FE) MG tablet, Take 325 mg by mouth daily with breakfast., Disp: , Rfl:  .  furosemide (LASIX) 40 MG tablet, Take 1 tablet (40 mg total) by mouth daily., Disp: 90 tablet, Rfl: 3 .  glucose blood (ACCU-CHEK AVIVA PLUS) test strip, Test blood sugar three times a day.  DX E11.9, Disp: 200 each, Rfl: 6 .  insulin detemir (LEVEMIR) 100 UNIT/ML injection, Inject 0.31 mLs (31 Units total) into the skin daily., Disp: 10 mL, Rfl: 3 .  Insulin Pen Needle 31G X 8 MM MISC, To use with insulin, Disp: 100 each, Rfl: 3 .  losartan (COZAAR) 25 MG tablet, Take 25 mg by mouth daily., Disp: , Rfl:  .  lovastatin (MEVACOR) 40 MG tablet, Take 40 mg by mouth at bedtime., Disp: , Rfl:  .  Melatonin 5 MG TABS,  Take 5 mg by mouth at bedtime as needed (for sleep). , Disp: , Rfl:  .  metoprolol succinate (TOPROL-XL) 25 MG 24 hr tablet, Take 12.5 mg by mouth daily., Disp: , Rfl:  .  Multiple Vitamin (MULTIVITAMIN) tablet, Take 1 tablet by mouth daily., Disp: , Rfl:  .  tamsulosin (FLOMAX) 0.4 MG CAPS capsule, Take 1 capsule (0.4 mg total) by mouth daily., Disp: 90 capsule, Rfl: 3 .  traZODone (DESYREL) 50 MG tablet, Take 0.5-1 tablets (25-50 mg total) by mouth at bedtime as needed for sleep., Disp: 30 tablet, Rfl: 3  Past Medical History: Past Medical History:  Diagnosis Date  . Aortic stenosis   . 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 Coronary artery disease with history of myocardial infarction without history of CABG    LIMA to the LAD, SVG to RCA 2016 for treatment of the percent left main stenosis and 80% RCA stenosis.    . Depression   . Diabetes mellitus, type II ([place])   . Hypertension   . Ischemic cardiomyopathy    EF 40% 2019  . 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 Lung mass   . PVC's (  premature ventricular contractions)   . S/P TAVR (transcatheter aortic valve replacement)     Tobacco Use: Social History   Tobacco Use  Smoking Status Former Smoker  . Packs/day: 0.20  . Years: 30.00  . Pack years: 6.00  . Types: Cigarettes  Smokeless Tobacco Never Used    Labs: Recent Review Flowsheet Data    Labs for ITP Cardiac and Pulmonary Rehab Latest Ref Rng & Units 02/01/2018 02/05/2018 02/05/2018 02/05/2018 02/05/2018   Cholestrol 100 - 199 mg/dL - - - - -   LDLCALC 0 - 99 mg/dL - - - - -   HDL >39 mg/dL - - - - -   Trlycerides 0 - 149 mg/dL - - - - -   Hemoglobin A1c 4.8 - 5.6 % 7.1(H) - - - -   PHART 7.350 - 7.450 7.442 - - - -   PCO2ART 32.0 - 48.0 mmHg 35.8 - - - -   HCO3 20.0 - 28.0 mmol/L 24.0 - - - -   TCO2 22  - 32 mmol/L - 27 27 28 24    ACIDBASEDEF 0.0 - 2.0 mmol/L - - - - -   O2SAT % 94.9 - - - -      Capillary Blood Glucose: Lab Results  Component Value Date   GLUCAP 104 (H) 04/29/2018   GLUCAP 254 (H) 04/29/2018   GLUCAP 106 (H) 04/26/2018   GLUCAP 244 (H) 04/26/2018   GLUCAP 307 (H) 02/06/2018     Exercise Target Goals: Exercise Program Goal: Individual exercise prescription set using results from initial 6 min walk test and THRR while considering  patient's activity barriers and safety.   Exercise Prescription Goal: Initial exercise prescription builds to 30-45 minutes a day of aerobic activity, 2-3 days per week.  Home exercise guidelines will be given to patient during program as part of exercise prescription that the participant will acknowledge.  Activity Barriers & Risk Stratification: Activity Barriers & Cardiac Risk Stratification - 04/18/18 1513      Activity Barriers & Cardiac Risk Stratification   Activity Barriers  None    Cardiac Risk Stratification  High       6 Minute Walk: 6 Minute Walk    Row Name 04/18/18 1512         6 Minute Walk   Phase  Initial     Distance  897 feet     Walk Time  6 minutes     # of Rest Breaks  0     MPH  1.7     METS  1.3     RPE  12     Perceived Dyspnea   0     VO2 Peak  4.54     Symptoms  No     Resting HR  74 bpm     Resting BP  128/60     Resting Oxygen Saturation   97 %     Exercise Oxygen Saturation  during 6 min walk  95 %     Max Ex. HR  98 bpm     Max Ex. BP  126/74     2 Minute Post BP  122/70        Oxygen Initial Assessment:   Oxygen Re-Evaluation:   Oxygen Discharge (Final Oxygen Re-Evaluation):   Initial Exercise Prescription: Initial Exercise Prescription - 04/18/18 1400      Date of Initial Exercise RX and Referring Provider   Date  04/18/18    Referring Provider  [name] MD    Expected Discharge Date  07/26/18      NuStep   Level  1    SPM  75    Minutes  10    METs  1.5       Arm Ergometer   Level  1    Watts  5    Minutes  10    METs  1.37      Track   Laps  6    Minutes  10    METs  2      Prescription Details   Frequency (times per week)  3x    Duration  Progress to 30 minutes of continuous aerobic without signs/symptoms of physical distress      Intensity   THRR 40-80% of Max Heartrate  54-107    Ratings of Perceived Exertion  11-13    Perceived Dyspnea  0-4      Progression   Progression  Continue progressive overload as per policy without signs/symptoms or physical distress.      Resistance Training   Training Prescription  Yes    Weight  2lbs    Reps  10-15       Perform Capillary Blood Glucose checks as needed.  Exercise Prescription Changes: Exercise Prescription Changes    Row Name 04/24/18 1400 05/10/18 1421           Response to Exercise   Blood Pressure (Admit)  122/80  120/60      Blood Pressure (Exercise)  142/70  152/74      Blood Pressure (Exit)  112/60  110/62      Heart Rate (Admit)  69 bpm  79 bpm      Heart Rate (Exercise)  111 bpm  112 bpm      Heart Rate (Exit)  67 bpm  89 bpm      Rating of Perceived Exertion (Exercise)  13  13      Perceived Dyspnea (Exercise)  0  0      Symptoms  None  None      Comments  Pt oriented to exercise equipment   None      Duration  Progress to 30 minutes of  aerobic without signs/symptoms of physical distress  Progress to 30 minutes of  aerobic without signs/symptoms of physical distress      Intensity  THRR unchanged  THRR unchanged        Progression   Progression  Continue to progress workloads to maintain intensity without signs/symptoms of physical distress.  Continue to progress workloads to maintain intensity without signs/symptoms of physical distress.      Average METs  2.3  2.2        Resistance Training   Training Prescription  No  Yes      Weight  -  2lbs      Reps  -  10-15      Time  -  10 Minutes        NuStep   Level  1  1      SPM  75  75       Minutes  10  10      METs  1.7  1.7        Arm Ergometer   Level  1  1      Watts  5  5      Minutes  10  10      METs  1.37  1.37        Track   Laps  7  10      Minutes  10  10      METs  2.23  2.76        Home Exercise Plan   Plans to continue exercise at  -  Home (comment) Walking      Frequency  -  Add 3 additional days to program exercise sessions.      Initial Home Exercises Provided  -  05/15/18         Exercise Comments: Exercise Comments    Row Name 04/24/18 1436 05/16/18 1618         Exercise Comments  Pt's first day of exericse. Pt tolerated exercise well. Will continue to monitor and progress pt as tolerated.   Reviewed HEP with pt. Will continue to monitor and progress as tolerated.          Exercise Goals and Review: Exercise Goals    Row Name 04/18/18 1338             Exercise Goals   Increase Physical Activity  Yes       Intervention  Provide advice, education, support and counseling about physical activity/exercise needs.;Develop an individualized exercise prescription for aerobic and resistive training based on initial evaluation findings, risk stratification, comorbidities and participant's personal goals.       Expected Outcomes  Long Term: Exercising regularly at least 3-5 days a week.;Short Term: Attend rehab on a regular basis to increase amount of physical activity.;Long Term: Add in home exercise to make exercise part of routine and to increase amount of physical activity.       Increase Strength and Stamina  Yes       Intervention  Provide advice, education, support and counseling about physical activity/exercise needs.;Develop an individualized exercise prescription for aerobic and resistive training based on initial evaluation findings, risk stratification, comorbidities and participant's personal goals.       Expected Outcomes  Short Term: Increase workloads from initial exercise prescription for resistance, speed, and METs.;Short Term:  Perform resistance training exercises routinely during rehab and add in resistance training at home;Long Term: Improve cardiorespiratory fitness, muscular endurance and strength as measured by increased METs and functional capacity (6MWT)       Able to understand and use rate of perceived exertion (RPE) scale  Yes       Intervention  Provide education and explanation on how to use RPE scale       Expected Outcomes  Short Term: Able to use RPE daily in rehab to express subjective intensity level;Long Term:  Able to use RPE to guide intensity level when exercising independently       Knowledge and understanding of Target Heart Rate Range (THRR)  Yes       Intervention  Provide education and explanation of THRR including how the numbers were predicted and where they are located for reference       Expected Outcomes  Short Term: Able to state/look up THRR;Long Term: Able to use THRR to govern intensity when exercising independently;Short Term: Able to use daily as guideline for intensity in rehab       Able to check pulse independently  Yes       Intervention  Provide education and demonstration on how to check pulse in carotid and radial arteries.;Review the importance of being able to check your own pulse for safety during independent exercise  Expected Outcomes  Short Term: Able to explain why pulse checking is important during independent exercise;Long Term: Able to check pulse independently and accurately       Understanding of Exercise Prescription  Yes       Intervention  Provide education, explanation, and written materials on patient's individual exercise prescription       Expected Outcomes  Short Term: Able to explain program exercise prescription;Long Term: Able to explain home exercise prescription to exercise independently          Exercise Goals Re-Evaluation : Exercise Goals Re-Evaluation    Row Name 05/16/18 1616 05/16/18 1619           Exercise Goal Re-Evaluation   Exercise  Goals Review  Increase Physical Activity;Able to understand and use rate of perceived exertion (RPE) scale;Knowledge and understanding of Target Heart Rate Range (THRR);Understanding of Exercise Prescription;Increase Strength and Stamina;Able to check pulse independently  -      Comments  Reviewed HEP with pt. Also reviewed THRR Range, RPE Scale, endpoints of exercise, NTG use, warmup and cool down.   Reviewed HEP with pt. Also reviewed THRR Range, RPE Scale, endpoints of exercise, NTG use, warmup and cool down.       Expected Outcomes  Pt will continue to walk 5-6 days a week for 45 minutes. Pt will continue to increase cardiovascular strength. Will continue to monitor and progress pt as tolerated.   -         Discharge Exercise Prescription (Final Exercise Prescription Changes): Exercise Prescription Changes - 05/10/18 1421      Response to Exercise   Blood Pressure (Admit)  120/60    Blood Pressure (Exercise)  152/74    Blood Pressure (Exit)  110/62    Heart Rate (Admit)  79 bpm    Heart Rate (Exercise)  112 bpm    Heart Rate (Exit)  89 bpm    Rating of Perceived Exertion (Exercise)  13    Perceived Dyspnea (Exercise)  0    Symptoms  None    Comments  None    Duration  Progress to 30 minutes of  aerobic without signs/symptoms of physical distress    Intensity  THRR unchanged      Progression   Progression  Continue to progress workloads to maintain intensity without signs/symptoms of physical distress.    Average METs  2.2      Resistance Training   Training Prescription  Yes    Weight  2lbs    Reps  10-15    Time  10 Minutes      NuStep   Level  1    SPM  75    Minutes  10    METs  1.7      Arm Ergometer   Level  1    Watts  5    Minutes  10    METs  1.37      Track   Laps  10    Minutes  10    METs  2.76      Home Exercise Plan   Plans to continue exercise at  Home (comment)   Walking   Frequency  Add 3 additional days to program exercise sessions.     Initial Home Exercises Provided  05/15/18       Nutrition:  Target Goals: Understanding of nutrition guidelines, daily intake of sodium 1500mg , cholesterol 200mg , calories 30% from fat and 7% or less from saturated fats, daily to  have 5 or more servings of fruits and vegetables.  Biometrics: Pre Biometrics - 04/18/18 1513      Pre Biometrics   Height  5' 3.75" (1.619 m)    Weight  67.6 kg    Waist Circumference  40 inches    Hip Circumference  39.5 inches    Waist to Hip Ratio  1.01 %    BMI (Calculated)  25.79    Triceps Skinfold  32 mm    % Body Fat  31.1 %    Grip Strength  27 kg    Flexibility  7 in    Single Leg Stand  2.56 seconds        Nutrition Therapy Plan and Nutrition Goals: Nutrition Therapy & Goals - 05/01/18 1212      Nutrition Therapy   Diet  carb modified      Personal Nutrition Goals   Nutrition Goal  Pt to identify and limit food sources of saturated fat, trans fat, refined carbohydrates and sodium    Personal Goal #2  Pt to build a healthy plate including vegetables, fruits, whole grains, and low-fat dairy products in a heart healthy meal plan    Personal Goal #3  Pt able to name foods that affect blood glucose    Personal Goal #4  Pt to add in a snack before exercise, and add a protein to breakfast      Intervention Plan   Intervention  Prescribe, educate and counsel regarding individualized specific dietary modifications aiming towards targeted core components such as weight, hypertension, lipid management, diabetes, heart failure and other comorbidities.    Expected Outcomes  Short Term Goal: Understand basic principles of dietary content, such as calories, fat, sodium, cholesterol and nutrients.;Long Term Goal: Adherence to prescribed nutrition plan.       Nutrition Assessments: Nutrition Assessments - 04/19/18 1045      MEDFICTS Scores   Pre Score  21       Nutrition Goals Re-Evaluation: Nutrition Goals Re-Evaluation    Row Name  04/18/18 1524             Goals   Current Weight  149 lb 0.5 oz (67.6 kg)          Nutrition Goals Re-Evaluation: Nutrition Goals Re-Evaluation    [place] Name 04/18/18 1524             Goals   Current Weight  149 lb 0.5 oz (67.6 kg)          Nutrition Goals Discharge (Final Nutrition Goals Re-Evaluation): Nutrition Goals Re-Evaluation - 04/18/18 1524      Goals   Current Weight  149 lb 0.5 oz (67.6 kg)       Psychosocial: Target Goals: Acknowledge presence or absence of significant depression and/or stress, maximize coping skills, provide positive support system. Participant is able to verbalize types and ability to use techniques and skills needed for reducing stress and depression.  Initial Review & Psychosocial Screening: Initial Psych Review & Screening - 04/18/18 1343      Initial Review   Current issues with  Current Stress Concerns    Source of Stress Concerns  Chronic Illness      Family Dynamics   Good Support System?  Yes   Josph Macho has his wife for support   Comments  Josph Macho says he worried about partcipating in rehab again as he previously partcipated in phase 2 cardiac rehab in [place]      Barriers   Psychosocial  barriers to participate in program  The patient should benefit from training in stress management and relaxation.      Screening Interventions   Interventions  Encouraged to exercise;To provide support and resources with identified psychosocial needs    Expected Outcomes  Long Term Goal: Stressors or current issues are controlled or eliminated.       Quality of Life Scores: Quality of Life - 04/18/18 1348      Quality of Life   Select  Quality of Life      Quality of Life Scores   Health/Function Pre  19.39 %    Socioeconomic Pre  25.83 %    Psych/Spiritual Pre  28.93 %    Family Pre  27.6 %    GLOBAL Pre  23.97 %      Scores of 19 and below usually indicate a poorer quality of life in these areas.  A difference of  2-3 points is a  clinically meaningful difference.  A difference of 2-3 points in the total score of the Quality of Life Index has been associated with significant improvement in overall quality of life, self-image, physical symptoms, and general health in studies assessing change in quality of life.  PHQ-9: Recent Review Flowsheet Data    Depression screen [facility] 2/9 04/24/2018   Decreased Interest 0   Down, Depressed, Hopeless 0   PHQ - 2 Score 0     Interpretation of Total Score  Total Score Depression Severity:  1-4 = Minimal depression, 5-9 = Mild depression, 10-14 = Moderate depression, 15-19 = Moderately severe depression, 20-27 = Severe depression   Psychosocial Evaluation and Intervention: Psychosocial Evaluation - 04/24/18 1531      Psychosocial Evaluation & Interventions   Interventions  Relaxation education;Stress management education;Encouraged to exercise with the program and follow exercise prescription    Comments  Current stress concerns relating to returning to [facility]. Emotional support given.  Josph Macho likes to watch TV and build model trains.     Expected Outcomes  Josph Macho will maintain a positive outlook and learn to manage his stress.     Continue Psychosocial Services   Follow up required by staff       Psychosocial Re-Evaluation:   Psychosocial Discharge (Final Psychosocial Re-Evaluation):   Vocational Rehabilitation: Provide vocational rehab assistance to qualifying candidates.   Vocational Rehab Evaluation & Intervention: Vocational Rehab - 04/18/18 1350      Initial Vocational Rehab Evaluation & Intervention   Assessment shows need for Vocational Rehabilitation  No   Josph Macho is retired and does not need vocational rehab for job retraining      Education: Education Goals: Education classes will be provided on a weekly basis, covering required topics. Participant will state understanding/return demonstration of topics presented.  Learning Barriers/Preferences: Learning  Barriers/Preferences - 04/18/18 1514      Learning Barriers/Preferences   Learning Barriers  Hearing    Learning Preferences  Written Material;Skilled Demonstration;Verbal Instruction       Education Topics: Count Your Pulse:  -Group instruction provided by verbal instruction, demonstration, patient participation and written materials to support subject.  Instructors address importance of being able to find your pulse and how to count your pulse when at home without a heart monitor.  Patients get hands on experience counting their pulse with staff help and individually.   CARDIAC REHAB PHASE II EXERCISE from 05/22/2018 in [facility]  Date  05/17/18  Educator  RN  Instruction Review Code  2-  Demonstrated Understanding      Heart Attack, Angina, and Risk Factor Modification:  -Group instruction provided by verbal instruction, video, and written materials to support subject.  Instructors address signs and symptoms of angina and heart attacks.    Also discuss risk factors for heart disease and how to make changes to improve heart health risk factors.   Functional Fitness:  -Group instruction provided by verbal instruction, demonstration, patient participation, and written materials to support subject.  Instructors address safety measures for doing things around the house.  Discuss how to get up and down off the floor, how to pick things up properly, how to safely get out of a chair without assistance, and balance training.   Meditation and Mindfulness:  -Group instruction provided by verbal instruction, patient participation, and written materials to support subject.  Instructor addresses importance of mindfulness and meditation practice to help reduce stress and improve awareness.  Instructor also leads participants through a meditation exercise.    CARDIAC REHAB PHASE II EXERCISE from 05/22/2018 in [facility]  Date  05/22/18   Instruction Review Code  2- Demonstrated Understanding      Stretching for Flexibility and Mobility:  -Group instruction provided by verbal instruction, patient participation, and written materials to support subject.  Instructors lead participants through series of stretches that are designed to increase flexibility thus improving mobility.  These stretches are additional exercise for major muscle groups that are typically performed during regular warm up and cool down.   Hands Only CPR:  -Group verbal, video, and participation provides a basic overview of AHA guidelines for community CPR. Role-play of emergencies allow participants the opportunity to practice calling for help and chest compression technique with discussion of AED use.   Hypertension: -Group verbal and written instruction that provides a basic overview of hypertension including the most recent diagnostic guidelines, risk factor reduction with self-care instructions and medication management.   CARDIAC REHAB PHASE II EXERCISE from 05/22/2018 in [facility]  Date  04/26/18  Educator  RN  Instruction Review Code  2- Demonstrated Understanding       Nutrition I class: Heart Healthy Eating:  -Group instruction provided by PowerPoint slides, verbal discussion, and written materials to support subject matter. The instructor gives an explanation and review of the Therapeutic Lifestyle Changes diet recommendations, which includes a discussion on lipid goals, dietary fat, sodium, fiber, plant stanol/sterol esters, sugar, and the components of a well-balanced, healthy diet.   Nutrition II class: Lifestyle Skills:  -Group instruction provided by PowerPoint slides, verbal discussion, and written materials to support subject matter. The instructor gives an explanation and review of label reading, grocery shopping for heart health, heart healthy recipe modifications, and ways to make healthier choices when  eating out.   Diabetes Question & Answer:  -Group instruction provided by PowerPoint slides, verbal discussion, and written materials to support subject matter. The instructor gives an explanation and review of diabetes co-morbidities, pre- and post-prandial blood glucose goals, pre-exercise blood glucose goals, signs, symptoms, and treatment of hypoglycemia and hyperglycemia, and foot care basics.   Diabetes Blitz:  -Group instruction provided by PowerPoint slides, verbal discussion, and written materials to support subject matter. The instructor gives an explanation and review of the physiology behind type 1 and type 2 diabetes, diabetes medications and rational behind using different medications, pre- and post-prandial blood glucose recommendations and Hemoglobin A1c goals, diabetes diet, and exercise including blood glucose guidelines for exercising safely.  Portion Distortion:  -Group instruction provided by PowerPoint slides, verbal discussion, written materials, and food models to support subject matter. The instructor gives an explanation of serving size versus portion size, changes in portions sizes over the last 20 years, and what consists of a serving from each food group.   Stress Management:  -Group instruction provided by verbal instruction, video, and written materials to support subject matter.  Instructors review role of stress in heart disease and how to cope with stress positively.     Exercising on Your Own:  -Group instruction provided by verbal instruction, power point, and written materials to support subject.  Instructors discuss benefits of exercise, components of exercise, frequency and intensity of exercise, and end points for exercise.  Also discuss use of nitroglycerin and activating EMS.  Review options of places to exercise outside of rehab.  Review guidelines for sex with heart disease.   CARDIAC REHAB PHASE II EXERCISE from 05/22/2018 in [place]  Date  05/15/18  Educator  EP  Instruction Review Code  2- Demonstrated Understanding      Cardiac Drugs I:  -Group instruction provided by verbal instruction and written materials to support subject.  Instructor reviews cardiac drug classes: antiplatelets, anticoagulants, beta blockers, and statins.  Instructor discusses reasons, side effects, and lifestyle considerations for each drug class.   Cardiac Drugs II:  -Group instruction provided by verbal instruction and written materials to support subject.  Instructor reviews cardiac drug classes: angiotensin converting enzyme inhibitors (ACE-I), angiotensin II receptor blockers (ARBs), nitrates, and calcium channel blockers.  Instructor discusses reasons, side effects, and lifestyle considerations for each drug class.   CARDIAC REHAB PHASE II EXERCISE from 05/22/2018 in [place]  Date  05/01/18  Instruction Review Code  2- Demonstrated Understanding      Anatomy and Physiology of the Circulatory System:  Group verbal and written instruction and models provide basic cardiac anatomy and physiology, with the coronary electrical and arterial systems. Review of: AMI, Angina, Valve disease, Heart Failure, Peripheral Artery Disease, Cardiac Arrhythmia, Pacemakers, and the ICD.   Other Education:  -Group or individual verbal, written, or video instructions that support the educational goals of the cardiac rehab program.   Holiday Eating Survival Tips:  -Group instruction provided by PowerPoint slides, verbal discussion, and written materials to support subject matter. The instructor gives patients tips, tricks, and techniques to help them not only survive but enjoy the holidays despite the onslaught of food that accompanies the holidays.   Knowledge Questionnaire Score: Knowledge Questionnaire Score - 04/18/18 1347      Knowledge Questionnaire Score   Pre Score  20/24       Core  Components/Risk Factors/Patient Goals at Admission: Personal Goals and Risk Factors at Admission - 04/18/18 1514      Core Components/Risk Factors/Patient Goals on Admission    Weight Management  Yes;Weight Loss    Intervention  Weight Management: Develop a combined nutrition and exercise program designed to reach desired caloric intake, while maintaining appropriate intake of nutrient and fiber, sodium and fats, and appropriate energy expenditure required for the weight goal.;Weight Management: Provide education and appropriate resources to help participant work on and attain dietary goals.;Weight Management/Obesity: Establish reasonable short term and long term weight goals.    Admit Weight  149 lb 0.5 oz (67.6 kg)    Goal Weight: Short Term  145 lb (65.8 kg)    Goal Weight: Long Term  140  lb (63.5 kg)    Expected Outcomes  Short Term: Continue to assess and modify interventions until short term weight is achieved;Long Term: Adherence to nutrition and physical activity/exercise program aimed toward attainment of established weight goal;Weight Maintenance: Understanding of the daily nutrition guidelines, which includes 25-35% calories from fat, 7% or less cal from saturated fats, less than 200mg  cholesterol, less than 1.5gm of sodium, & 5 or more servings of fruits and vegetables daily;Weight Loss: Understanding of general recommendations for a balanced deficit meal plan, which promotes 1-2 lb weight loss per week and includes a negative energy balance of [phone] kcal/d;Understanding recommendations for meals to include 15-35% energy as protein, 25-35% energy from fat, 35-60% energy from carbohydrates, less than 200mg  of dietary cholesterol, 20-35 gm of total fiber daily;Understanding of distribution of calorie intake throughout the day with the consumption of 4-5 meals/snacks    Diabetes  Yes    Intervention  Provide education about signs/symptoms and action to take for hypo/hyperglycemia.;Provide  education about proper nutrition, including hydration, and aerobic/resistive exercise prescription along with prescribed medications to achieve blood glucose in normal ranges: Fasting glucose 65-99 mg/dL    Expected Outcomes  Short Term: Participant verbalizes understanding of the signs/symptoms and immediate care of hyper/hypoglycemia, proper foot care and importance of medication, aerobic/resistive exercise and nutrition plan for blood glucose control.;Long Term: Attainment of HbA1C < 7%.    Heart Failure  Yes    Intervention  Provide a combined exercise and nutrition program that is supplemented with education, support and counseling about heart failure. Directed toward relieving symptoms such as shortness of breath, decreased exercise tolerance, and extremity edema.    Expected Outcomes  Improve functional capacity of life;Short term: Attendance in program 2-3 days a week with increased exercise capacity. Reported lower sodium intake. Reported increased fruit and vegetable intake. Reports medication compliance.;Short term: Daily weights obtained and reported for increase. Utilizing diuretic protocols set by physician.;Long term: Adoption of self-care skills and reduction of barriers for early signs and symptoms recognition and intervention leading to self-care maintenance.    Hypertension  Yes    Intervention  Provide education on lifestyle modifcations including regular physical activity/exercise, weight management, moderate sodium restriction and increased consumption of fresh fruit, vegetables, and low fat dairy, alcohol moderation, and smoking cessation.;Monitor prescription use compliance.    Expected Outcomes  Short Term: Continued assessment and intervention until BP is < 140/58mm HG in hypertensive participants. < 130/17mm HG in hypertensive participants with diabetes, heart failure or chronic kidney disease.;Long Term: Maintenance of blood pressure at goal levels.    Lipids  Yes    Intervention   Provide education and support for participant on nutrition & aerobic/resistive exercise along with prescribed medications to achieve LDL 70mg , HDL >40mg .    Expected Outcomes  Long Term: Cholesterol controlled with medications as prescribed, with individualized exercise RX and with personalized nutrition plan. Value goals: LDL < 70mg , HDL > 40 mg.;Short Term: Participant states understanding of desired cholesterol values and is compliant with medications prescribed. Participant is following exercise prescription and nutrition guidelines.       Core Components/Risk Factors/Patient Goals Review:  Goals and Risk Factor Review    Row Name 04/24/18 1534 05/23/18 1417           Core Components/Risk Factors/Patient Goals Review   Personal Goals Review  Weight Management/Obesity;Lipids;Heart Failure;Diabetes;Hypertension  Weight Management/Obesity;Lipids;Heart Failure;Diabetes;Hypertension      Review  Pt with multiple CAD RFs willing to participate in CR exercise.  Josph Macho  would like to increase his walking speed.  Pt with multiple CAD RFs willing to participate in CR exercise.  Josph Macho is tolerating exercise well.  VSS.  He plans to only complete 26 sessions.       Expected Outcomes  Pt will continue to participate in CR exercise, nutrition, and lifestyle modification opportunities.   Pt will continue to participate in CR exercise, nutrition, and lifestyle modification opportunities.          Core Components/Risk Factors/Patient Goals at Discharge (Final Review):  Goals and Risk Factor Review - 05/23/18 1417      Core Components/Risk Factors/Patient Goals Review   Personal Goals Review  Weight Management/Obesity;Lipids;Heart Failure;Diabetes;Hypertension    Review  Pt with multiple CAD RFs willing to participate in CR exercise.  Josph Macho is tolerating exercise well.  VSS.  He plans to only complete 26 sessions.     Expected Outcomes  Pt will continue to participate in CR exercise, nutrition, and lifestyle  modification opportunities.        ITP Comments: ITP Comments    Row Name 04/18/18 1337 04/24/18 1531 05/23/18 1415       ITP Comments  Dr. Fransico Him, Medical Director  30 Day ITP Review. Fred started exercise today and tolerated it well.   30 Day ITP Review. Josph Macho is tolerating exercise well.  VSS.  He plans to only complete 26 sessions.         Comments: ITP Comments

## 2018-05-24 ENCOUNTER — Encounter (HOSPITAL_COMMUNITY)
Admission: RE | Admit: 2018-05-24 | Discharge: 2018-05-24 | Disposition: A | Discharge status: Home / Self Care | Payer: Medicare Other | Source: Ambulatory Visit | Attending: Cardiology | Admitting: Cardiology

## 2018-05-24 ENCOUNTER — Ambulatory Visit (HOSPITAL_COMMUNITY): Payer: Medicare Other

## 2018-05-24 DIAGNOSIS — Z952 Presence of prosthetic heart valve: Secondary | ICD-10-CM | POA: Diagnosis not present

## 2018-05-27 ENCOUNTER — Ambulatory Visit (HOSPITAL_COMMUNITY): Payer: Medicare Other

## 2018-05-27 ENCOUNTER — Encounter (HOSPITAL_COMMUNITY)
Admission: RE | Admit: 2018-05-27 | Discharge: 2018-05-27 | Disposition: A | Discharge status: Home / Self Care | Payer: Medicare Other | Source: Ambulatory Visit | Attending: Cardiology | Admitting: Cardiology

## 2018-05-27 DIAGNOSIS — Z952 Presence of prosthetic heart valve: Secondary | ICD-10-CM | POA: Diagnosis not present

## 2018-05-29 ENCOUNTER — Encounter (HOSPITAL_COMMUNITY)
Admission: RE | Admit: 2018-05-29 | Discharge: 2018-05-29 | Disposition: A | Discharge status: Home / Self Care | Payer: Medicare Other | Source: Ambulatory Visit | Attending: Cardiology | Admitting: Cardiology

## 2018-05-29 ENCOUNTER — Ambulatory Visit (HOSPITAL_COMMUNITY): Payer: Medicare Other

## 2018-05-29 ENCOUNTER — Other Ambulatory Visit: Payer: Self-pay

## 2018-05-29 DIAGNOSIS — Z952 Presence of prosthetic heart valve: Secondary | ICD-10-CM | POA: Diagnosis not present

## 2018-05-30 ENCOUNTER — Ambulatory Visit (INDEPENDENT_AMBULATORY_CARE_PROVIDER_SITE_OTHER): Payer: Medicare Other | Admitting: Family Medicine

## 2018-05-30 ENCOUNTER — Encounter: Payer: Self-pay | Admitting: Family Medicine

## 2018-05-30 VITALS — BP 132/84 | HR 85 | Temp 97.9°F | Ht 64.0 in | Wt 150.5 lb

## 2018-05-30 DIAGNOSIS — B9689 Other specified bacterial agents as the cause of diseases classified elsewhere: Secondary | ICD-10-CM

## 2018-05-30 DIAGNOSIS — J208 Acute bronchitis due to other specified organisms: Secondary | ICD-10-CM | POA: Diagnosis not present

## 2018-05-30 MED ORDER — AZITHROMYCIN 250 MG PO TABS: ORAL_TABLET | ORAL | 0 refills | Status: DC

## 2018-05-30 NOTE — Patient Instructions (Addendum)
Continue to push fluids, practice good hand hygiene, and cover your mouth if you cough.  If you start having fevers, shaking or shortness of breath, seek immediate care.  Use the honey for the cough.   For symptoms, consider using Vick's VapoRub on chest or under nose, air humidifier, Benadryl at night, and elevating the head of the bed. Tylenol for aches and pains you may be experiencing.   Let us know if you need anything.

## 2018-05-30 NOTE — Progress Notes (Signed)
Chief Complaint  Patient presents with  . Cough  . Nasal Congestion    Austin Lloyd [facility] here for URI complaints.  Duration: 4 weeks  Associated symptoms: wheezing and cough Denies: sinus congestion, sinus pain, rhinorrhea, itchy watery eyes, ear fullness, ear pain, ear drainage, sore throat, shortness of breath, myalgia and fevers Treatment to date: honey, tessalon Perles, hot water Sick contacts: Yes  ROS:  Const: Denies fevers HEENT: As noted in HPI Lungs: +cough  Past Medical History:  Diagnosis Date  . Aortic stenosis   . Bladder cancer ([place])    T1 status post BCG  . 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 Coronary artery disease with history of myocardial infarction without history of CABG    LIMA to the LAD, SVG to RCA 2016 for treatment of the percent left main stenosis and 80% RCA stenosis.    . Depression   . Diabetes mellitus, type II ([place])   . Hypertension   . Ischemic cardiomyopathy    EF 40% 2019  . Lung cancer ([place])    Adenocarcinoma of the right lung moderately differentiated  . Lung mass   . PVC's (premature ventricular contractions)   . S/P TAVR (transcatheter aortic valve replacement)     BP 132/84 (BP Location: Left Arm, Patient Position: Sitting, Cuff Size: Normal)   Pulse 85   Temp 97.9 F (36.6 C) (Oral)   Ht 5\' 4"  (1.626 m)   Wt 150 lb 8 oz (68.3 kg)   SpO2 93%   BMI 25.83 kg/m  General: Awake, alert, appears stated age HEENT: AT, [place], ears patent b/l and TM's neg, nares patent w/o discharge, pharynx pink and without exudates, MMM Neck: No masses or asymmetry Heart: RRR Lungs: coarse breath sounds at bases, no accessory muscle use Psych: Age appropriate judgment and insight, normal mood and affect  Acute bacterial bronchitis - Plan: azithromycin (ZITHROMAX) 250 MG tablet  Orders as above. Cont honey. Continue to push fluids, practice good hand hygiene, cover mouth  when coughing. F/u in 1.5 weeks prn. If starting to experience fevers, shaking, or shortness of breath, seek immediate care. Pt voiced understanding and agreement to the plan.  [place], DO 05/30/18 1:43 PM

## 2018-05-31 ENCOUNTER — Ambulatory Visit (HOSPITAL_COMMUNITY): Payer: Medicare Other

## 2018-05-31 ENCOUNTER — Other Ambulatory Visit: Payer: Self-pay

## 2018-05-31 ENCOUNTER — Telehealth: Payer: Self-pay | Admitting: Adult Health

## 2018-05-31 ENCOUNTER — Encounter (HOSPITAL_COMMUNITY)
Admission: RE | Admit: 2018-05-31 | Discharge: 2018-05-31 | Disposition: A | Discharge status: Home / Self Care | Payer: Medicare Other | Source: Ambulatory Visit | Attending: Cardiology | Admitting: Cardiology

## 2018-05-31 DIAGNOSIS — Z952 Presence of prosthetic heart valve: Secondary | ICD-10-CM

## 2018-05-31 NOTE — Telephone Encounter (Signed)
New Message:    Patient calling to ask a question. Patient did not tell me what he wanted. Please call patient.

## 2018-05-31 NOTE — Telephone Encounter (Signed)
Austin Lloyd. I am not sure about any of that. I totally understand his concern given the Covid-19 pandemic. I have Jackson (Nurse navigator) for cardiac rehab on this thread and will defer to her. Thanks.

## 2018-05-31 NOTE — Telephone Encounter (Signed)
Spoke to patient. Patient is calling  He states that when he saw Marita Snellen PA - in Dec 2019 after  TAVR SURGERY.  Patient states she handed  Him a paper about cardiac rehab  - to go for 26 weeks, patient states cardiac rehab is telling him he has to go for 36 times -or insurance may not pay for it  patient states he is concern about the virus and also he some times have meetings that interfere with appointment time.  RN informed patient  Cardiac rehab- is 3 times week for 12 weeks.  Suggest  To talk with rehab to see what would happen if he miss a day can he return to finish for the next visit. Patient aware will  Defer to [facility] T.

## 2018-06-03 ENCOUNTER — Telehealth (HOSPITAL_COMMUNITY): Payer: Self-pay | Admitting: Family Medicine

## 2018-06-03 ENCOUNTER — Encounter (HOSPITAL_COMMUNITY): Payer: Medicare Other

## 2018-06-03 ENCOUNTER — Ambulatory Visit (HOSPITAL_COMMUNITY): Payer: Medicare Other

## 2018-06-05 ENCOUNTER — Encounter (HOSPITAL_COMMUNITY): Payer: Medicare Other

## 2018-06-05 ENCOUNTER — Ambulatory Visit (HOSPITAL_COMMUNITY): Payer: Medicare Other

## 2018-06-06 ENCOUNTER — Ambulatory Visit (HOSPITAL_BASED_OUTPATIENT_CLINIC_OR_DEPARTMENT_OTHER)
Admission: RE | Admit: 2018-06-06 | Discharge: 2018-06-06 | Disposition: A | Discharge status: Home / Self Care | Payer: Medicare Other | Source: Ambulatory Visit | Attending: Family Medicine | Admitting: Family Medicine

## 2018-06-06 ENCOUNTER — Ambulatory Visit (INDEPENDENT_AMBULATORY_CARE_PROVIDER_SITE_OTHER): Payer: Medicare Other | Admitting: Family Medicine

## 2018-06-06 ENCOUNTER — Other Ambulatory Visit: Payer: Self-pay

## 2018-06-06 ENCOUNTER — Encounter: Payer: Self-pay | Admitting: Family Medicine

## 2018-06-06 VITALS — BP 132/82 | HR 80 | Temp 98.1°F | Ht 64.0 in | Wt 148.5 lb

## 2018-06-06 DIAGNOSIS — R059 Cough, unspecified: Secondary | ICD-10-CM

## 2018-06-06 DIAGNOSIS — R05 Cough: Secondary | ICD-10-CM

## 2018-06-06 MED ORDER — LEVOCETIRIZINE DIHYDROCHLORIDE 5 MG PO TABS: 5.0000 mg | ORAL_TABLET | Freq: Every evening | ORAL | 2 refills | Status: DC

## 2018-06-06 MED ORDER — METHYLPREDNISOLONE ACETATE 80 MG/ML IJ SUSP
80.0000 mg | Freq: Once | INTRAMUSCULAR | Status: AC
  Administered 2018-06-06: 80 mg via INTRAMUSCULAR

## 2018-06-06 NOTE — Addendum Note (Signed)
Addended by: Sharon Seller B on: 06/06/2018 02:17 PM   Modules accepted: Orders

## 2018-06-06 NOTE — Patient Instructions (Addendum)
Continue to push fluids, practice good hand hygiene, and cover your mouth if you cough.  If you start having fevers, shaking or shortness of breath, seek immediate care.  If you are feeling better, cancel appointment.   Let us know if you need anything.

## 2018-06-06 NOTE — Progress Notes (Signed)
Chief Complaint  Patient presents with  . Cough  . Wheezing  . Shortness of Breath    [facility] Lloyd [facility] here for URI complaints.  Duration: 5 weeks  Associated symptoms: sinus congestion, wheezing, shortness of breath and cough Denies: sinus pain, rhinorrhea, itchy watery eyes, ear pain, ear drainage, fevers and sore throat Treatment to date: Zpak, [place] contacts: No  ROS:  Const: Denies fevers HEENT: As noted in HPI Lungs: +cough  Past Medical History:  Diagnosis Date  . Aortic stenosis   . Bladder cancer ([place])    T1 status post BCG  . 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 Coronary artery disease with history of myocardial infarction without history of CABG    LIMA to the LAD, SVG to RCA 2016 for treatment of the percent left main stenosis and 80% RCA stenosis.    . Depression   . Diabetes mellitus, type II ([place])   . Hypertension   . Ischemic cardiomyopathy    EF 40% 2019  . Lung cancer ([place])    Adenocarcinoma of the right lung moderately differentiated  . Lung mass   . PVC's (premature ventricular contractions)   . S/P TAVR (transcatheter aortic valve replacement)     BP 132/82 (BP Location: Left Arm, Patient Position: Sitting, Cuff Size: Normal)   Pulse 80   Temp 98.1 F (36.7 C) (Oral)   Ht 5\' 4"  (1.626 m)   Wt 148 lb 8 oz (67.4 kg)   SpO2 94%   BMI 25.49 kg/m  General: Awake, alert, appears stated age HEENT: AT, [place], ears patent b/l and TM's neg, nares patent w/o discharge, pharynx pink and without exudates, there is clear drainage in pharynx, MMM Neck: No masses or asymmetry Heart: RRR Lungs: CTAB, no accessory muscle use Psych: Age appropriate judgment and insight, normal mood and affect  Cough - Plan: levocetirizine (XYZAL) 5 MG tablet, DG Chest 2 View  IM Depo today. Ck CXR. PO antihistamine.  Continue to push fluids, practice good hand hygiene, cover mouth when  coughing. F/u prn. If starting to experience fevers, shaking, or shortness of breath, seek immediate care. Pt voiced understanding and agreement to the plan.  [place], DO 06/06/18 2:07 PM

## 2018-06-07 ENCOUNTER — Other Ambulatory Visit: Payer: Self-pay

## 2018-06-07 ENCOUNTER — Encounter (HOSPITAL_COMMUNITY): Payer: Medicare Other

## 2018-06-07 ENCOUNTER — Ambulatory Visit (HOSPITAL_COMMUNITY): Payer: Medicare Other

## 2018-06-07 ENCOUNTER — Encounter: Payer: Self-pay | Admitting: Family Medicine

## 2018-06-07 ENCOUNTER — Other Ambulatory Visit: Payer: Self-pay | Admitting: Family Medicine

## 2018-06-07 DIAGNOSIS — R05 Cough: Secondary | ICD-10-CM

## 2018-06-07 DIAGNOSIS — R059 Cough, unspecified: Secondary | ICD-10-CM

## 2018-06-07 NOTE — Progress Notes (Signed)
Covid testing ordered.

## 2018-06-10 ENCOUNTER — Telehealth: Payer: Self-pay | Admitting: Family Medicine

## 2018-06-10 ENCOUNTER — Ambulatory Visit (HOSPITAL_COMMUNITY): Payer: Medicare Other

## 2018-06-10 ENCOUNTER — Encounter (HOSPITAL_COMMUNITY): Payer: Medicare Other

## 2018-06-10 ENCOUNTER — Ambulatory Visit: Payer: Medicare Other | Admitting: Family Medicine

## 2018-06-10 NOTE — Telephone Encounter (Signed)
Called the patient today 06/10/2018 to clarify symptoms for todays appt. The patient stated he is doing better since appt on 06/06/2018. No fever, SOB only with activity, no sore throat, coughing upon waking in the mornings and coughing later in the afternoon into the evening.  Taking cough medication at night and sleeping well. Patient did Covid testing on Friday as advised by PCP.  Awaiting results. The patient was advised not to come into the office due to Covid Virus and patient is improving but needs to stay home. Patient advised to please stay home and if symptoms change/worsen to let PCP know. The patient verbalized understanding...Marland Kitchenappt today was canceled.

## 2018-06-11 DIAGNOSIS — E1159 Type 2 diabetes mellitus with other circulatory complications: Secondary | ICD-10-CM | POA: Diagnosis not present

## 2018-06-11 DIAGNOSIS — L84 Corns and callosities: Secondary | ICD-10-CM | POA: Diagnosis not present

## 2018-06-11 DIAGNOSIS — L602 Onychogryphosis: Secondary | ICD-10-CM | POA: Diagnosis not present

## 2018-06-12 ENCOUNTER — Encounter (HOSPITAL_COMMUNITY): Payer: Medicare Other

## 2018-06-12 ENCOUNTER — Telehealth: Payer: Self-pay | Admitting: Physician Assistant

## 2018-06-12 ENCOUNTER — Ambulatory Visit (HOSPITAL_COMMUNITY): Payer: Medicare Other

## 2018-06-12 NOTE — Telephone Encounter (Signed)
I spoke with patient about his upcoming appointment with [name] tomorrow.  I also spoke with his daughter Vaughan Basta with his consent.  They are willing to try WebEx appointment.  Patient has had a cough and low-grade fever a couple weeks ago.  He was actually tested on Friday for the Covid 19 but does not have the results back.Has not traveled to a high risk area. Please send patient the info through [place] on how to download webx

## 2018-06-12 NOTE — Telephone Encounter (Signed)
Webex Instruction send to pt in [place]

## 2018-06-13 ENCOUNTER — Telehealth (HOSPITAL_COMMUNITY): Payer: Self-pay | Admitting: *Deleted

## 2018-06-13 ENCOUNTER — Ambulatory Visit (INDEPENDENT_AMBULATORY_CARE_PROVIDER_SITE_OTHER): Payer: Medicare Other | Admitting: Cardiology

## 2018-06-13 ENCOUNTER — Encounter: Payer: Self-pay | Admitting: Cardiology

## 2018-06-13 VITALS — Ht 64.0 in | Wt 148.0 lb

## 2018-06-13 DIAGNOSIS — N183 Chronic kidney disease, stage 3 unspecified: Secondary | ICD-10-CM

## 2018-06-13 DIAGNOSIS — I5042 Chronic combined systolic (congestive) and diastolic (congestive) heart failure: Secondary | ICD-10-CM

## 2018-06-13 DIAGNOSIS — I10 Essential (primary) hypertension: Secondary | ICD-10-CM

## 2018-06-13 DIAGNOSIS — Z952 Presence of prosthetic heart valve: Secondary | ICD-10-CM

## 2018-06-13 DIAGNOSIS — I130 Hypertensive heart and chronic kidney disease with heart failure and stage 1 through stage 4 chronic kidney disease, or unspecified chronic kidney disease: Secondary | ICD-10-CM

## 2018-06-13 NOTE — Telephone Encounter (Addendum)
Pt called and updated about extended closure of cardiac rehab for four weeks d/t CoVID-19.  Pt has been walking outside during closure and wishes to not return to cardiac rehab when it opens. Pt will be discharged from the program.

## 2018-06-13 NOTE — Telephone Encounter (Signed)
Pt had video visit with Dr Percival Spanish today

## 2018-06-13 NOTE — Progress Notes (Signed)
Cardiac Individual Treatment Plan  Patient Details  Name: Austin Lloyd MRN: [id number] Date of Birth: 04/14/1929 Referring Provider:     CARDIAC REHAB PHASE II ORIENTATION from 04/18/2018 in [facility]  Referring Provider  [name] MD      Initial Encounter Date:    CARDIAC REHAB PHASE II ORIENTATION from 04/18/2018 in [facility]  Date  04/18/18      Visit Diagnosis: S/P TAVR (transcatheter aortic valve replacement)  Patient's Home Medications on Admission:  Current Outpatient Medications:  .  ACCU-CHEK SOFTCLIX LANCETS lancets, Test blood sugar three times a day.  DX E11.9, Disp: 200 each, Rfl: 6 .  aspirin EC 81 MG tablet, Take 81 mg by mouth every evening. , Disp: , Rfl:  .  clopidogrel (PLAVIX) 75 MG tablet, Take 1 tablet (75 mg total) by mouth daily with breakfast., Disp: 90 tablet, Rfl: 1 .  ferrous sulfate 325 (65 FE) MG tablet, Take 325 mg by mouth daily with breakfast., Disp: , Rfl:  .  furosemide (LASIX) 40 MG tablet, Take 1 tablet (40 mg total) by mouth daily., Disp: 90 tablet, Rfl: 3 .  glucose blood (ACCU-CHEK AVIVA PLUS) test strip, Test blood sugar three times a day.  DX E11.9, Disp: 200 each, Rfl: 6 .  insulin detemir (LEVEMIR) 100 UNIT/ML injection, Inject 0.31 mLs (31 Units total) into the skin daily., Disp: 10 mL, Rfl: 3 .  Insulin Pen Needle 31G X 8 MM MISC, To use with insulin, Disp: 100 each, Rfl: 3 .  levocetirizine (XYZAL) 5 MG tablet, Take 1 tablet (5 mg total) by mouth every evening., Disp: 30 tablet, Rfl: 2 .  losartan (COZAAR) 25 MG tablet, Take 25 mg by mouth daily., Disp: , Rfl:  .  lovastatin (MEVACOR) 40 MG tablet, Take 40 mg by mouth at bedtime., Disp: , Rfl:  .  Melatonin 5 MG TABS, Take 5 mg by mouth at bedtime as needed (for sleep). , Disp: , Rfl:  .  metoprolol succinate (TOPROL-XL) 25 MG 24 hr tablet, Take 12.5 mg by mouth daily., Disp: , Rfl:  .  Multiple Vitamin  (MULTIVITAMIN) tablet, Take 1 tablet by mouth daily., Disp: , Rfl:  .  tamsulosin (FLOMAX) 0.4 MG CAPS capsule, Take 1 capsule (0.4 mg total) by mouth daily., Disp: 90 capsule, Rfl: 3 .  traZODone (DESYREL) 50 MG tablet, Take 0.5-1 tablets (25-50 mg total) by mouth at bedtime as needed for sleep., Disp: 30 tablet, Rfl: 3  Past Medical History: Past Medical History:  Diagnosis Date  . Aortic stenosis   . Bladder cancer ([place])    T1 status post BCG  . 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 Coronary artery disease with history of myocardial infarction without history of CABG    LIMA to the LAD, SVG to RCA 2016 for treatment of the percent left main stenosis and 80% RCA stenosis.    . Depression   . Diabetes mellitus, type II ([place])   . Hypertension   . Ischemic cardiomyopathy    EF 40% 2019  . 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 Lung mass   . PVC's (premature ventricular contractions)   . S/P TAVR (transcatheter aortic valve replacement)     Tobacco Use: Social History   Tobacco Use  Smoking Status Former Smoker  . Packs/day: 0.20  . Years:  30.00  . Pack years: 6.00  . Types: Cigarettes  Smokeless Tobacco Never Used    Labs: Recent Review Flowsheet Data    Labs for ITP Cardiac and Pulmonary Rehab Latest Ref Rng & Units 02/01/2018 02/05/2018 02/05/2018 02/05/2018 02/05/2018   Cholestrol 100 - 199 mg/dL - - - - -   LDLCALC 0 - 99 mg/dL - - - - -   HDL >39 mg/dL - - - - -   Trlycerides 0 - 149 mg/dL - - - - -   Hemoglobin A1c 4.8 - 5.6 % 7.1(H) - - - -   PHART 7.350 - 7.450 7.442 - - - -   PCO2ART 32.0 - 48.0 mmHg 35.8 - - - -   HCO3 20.0 - 28.0 mmol/L 24.0 - - - -   TCO2 22 - 32 mmol/L - 27 27 28 24    ACIDBASEDEF 0.0 - 2.0 mmol/L - - - - -   O2SAT % 94.9 - - - -      Capillary Blood Glucose: Lab Results  Component Value Date   GLUCAP 104 (H) 04/29/2018    GLUCAP 254 (H) 04/29/2018   GLUCAP 106 (H) 04/26/2018   GLUCAP 244 (H) 04/26/2018   GLUCAP 307 (H) 02/06/2018     Exercise Target Goals: Exercise Program Goal: Individual exercise prescription set using results from initial 6 min walk test and THRR while considering  patient's activity barriers and safety.   Exercise Prescription Goal: Initial exercise prescription builds to 30-45 minutes a day of aerobic activity, 2-3 days per week.  Home exercise guidelines will be given to patient during program as part of exercise prescription that the participant will acknowledge.  Activity Barriers & Risk Stratification: Activity Barriers & Cardiac Risk Stratification - 04/18/18 1513      Activity Barriers & Cardiac Risk Stratification   Activity Barriers  None    Cardiac Risk Stratification  High       6 Minute Walk: 6 Minute Walk    Row Name 04/18/18 1512         6 Minute Walk   Phase  Initial     Distance  897 feet     Walk Time  6 minutes     # of Rest Breaks  0     MPH  1.7     METS  1.3     RPE  12     Perceived Dyspnea   0     VO2 Peak  4.54     Symptoms  No     Resting HR  74 bpm     Resting BP  128/60     Resting Oxygen Saturation   97 %     Exercise Oxygen Saturation  during 6 min walk  95 %     Max Ex. HR  98 bpm     Max Ex. BP  126/74     2 Minute Post BP  122/70        Oxygen Initial Assessment:   Oxygen Re-Evaluation:   Oxygen Discharge (Final Oxygen Re-Evaluation):   Initial Exercise Prescription: Initial Exercise Prescription - 04/18/18 1400      Date of Initial Exercise RX and Referring Provider   Date  04/18/18    Referring Provider  [name] MD    Expected Discharge Date  07/26/18      NuStep   Level  1    SPM  75    Minutes  10  METs  1.5      Arm Ergometer   Level  1    Watts  5    Minutes  10    METs  1.37      Track   Laps  6    Minutes  10    METs  2      Prescription Details   Frequency (times per week)  3x     Duration  Progress to 30 minutes of continuous aerobic without signs/symptoms of physical distress      Intensity   THRR 40-80% of Max Heartrate  54-107    Ratings of Perceived Exertion  11-13    Perceived Dyspnea  0-4      Progression   Progression  Continue progressive overload as per policy without signs/symptoms or physical distress.      Resistance Training   Training Prescription  Yes    Weight  2lbs    Reps  10-15       Perform Capillary Blood Glucose checks as needed.  Exercise Prescription Changes: Exercise Prescription Changes    Row Name 04/24/18 1400 05/10/18 1421 05/22/18 1458 05/31/18 1500       Response to Exercise   Blood Pressure (Admit)  122/80  120/60  120/80  130/80    Blood Pressure (Exercise)  142/70  152/74  142/70  158/84    Blood Pressure (Exit)  112/60  110/62  132/70  110/66    Heart Rate (Admit)  69 bpm  79 bpm  77 bpm  79 bpm    Heart Rate (Exercise)  111 bpm  112 bpm  12 bpm  115 bpm    Heart Rate (Exit)  67 bpm  89 bpm  85 bpm  86 bpm    Rating of Perceived Exertion (Exercise)  13  13  12  11     Perceived Dyspnea (Exercise)  0  0  0  0    Symptoms  None  None  None  None    Comments  Pt oriented to exercise equipment   None  None  None    Duration  Progress to 30 minutes of  aerobic without signs/symptoms of physical distress  Progress to 30 minutes of  aerobic without signs/symptoms of physical distress  Progress to 30 minutes of  aerobic without signs/symptoms of physical distress  Progress to 30 minutes of  aerobic without signs/symptoms of physical distress    Intensity  THRR unchanged  THRR unchanged  THRR unchanged  THRR unchanged      Progression   Progression  Continue to progress workloads to maintain intensity without signs/symptoms of physical distress.  Continue to progress workloads to maintain intensity without signs/symptoms of physical distress.  Continue to progress workloads to maintain intensity without signs/symptoms of  physical distress.  Continue to progress workloads to maintain intensity without signs/symptoms of physical distress.    Average METs  2.3  2.2  2.3  2.3      Resistance Training   Training Prescription  No  Yes  No  Yes    Weight  -  2lbs  -  2lbs    Reps  -  10-15  -  10-15    Time  -  10 Minutes  -  10 Minutes      NuStep   Level  1  1  2  2     SPM  75  75  85  85    Minutes  10  10  10  10     METs  1.7  1.7  1.9  2.2      Arm Ergometer   Level  1  1  1  1     Watts  5  5  5  5     Minutes  10  10  10  10     METs  1.37  1.37  2.1  1.9      Track   Laps  7  10  10  10     Minutes  10  10  10  10     METs  2.23  2.76  2.76  2.76      Home Exercise Plan   Plans to continue exercise at  -  Home (comment) Walking  Home (comment) Walking  Home (comment) Walking    Frequency  -  Add 3 additional days to program exercise sessions.  Add 3 additional days to program exercise sessions.  Add 3 additional days to program exercise sessions.    Initial Home Exercises Provided  -  05/15/18  05/15/18  05/15/18       Exercise Comments: Exercise Comments    Row Name 04/24/18 1436 05/16/18 1618 06/07/18 1501       Exercise Comments  Pt's first day of exericse. Pt tolerated exercise well. Will continue to monitor and progress pt as tolerated.   Reviewed HEP with pt. Will continue to monitor and progress as tolerated.   Pt has been tolerating exercise well. Exercise is currently on hold due to departmental closure per CDC recommendation to help prevent COVID-19 spread.         Exercise Goals and Review: Exercise Goals    Row Name 04/18/18 1338             Exercise Goals   Increase Physical Activity  Yes       Intervention  Provide advice, education, support and counseling about physical activity/exercise needs.;Develop an individualized exercise prescription for aerobic and resistive training based on initial evaluation findings, risk stratification, comorbidities and participant's  personal goals.       Expected Outcomes  Long Term: Exercising regularly at least 3-5 days a week.;Short Term: Attend rehab on a regular basis to increase amount of physical activity.;Long Term: Add in home exercise to make exercise part of routine and to increase amount of physical activity.       Increase Strength and Stamina  Yes       Intervention  Provide advice, education, support and counseling about physical activity/exercise needs.;Develop an individualized exercise prescription for aerobic and resistive training based on initial evaluation findings, risk stratification, comorbidities and participant's personal goals.       Expected Outcomes  Short Term: Increase workloads from initial exercise prescription for resistance, speed, and METs.;Short Term: Perform resistance training exercises routinely during rehab and add in resistance training at home;Long Term: Improve cardiorespiratory fitness, muscular endurance and strength as measured by increased METs and functional capacity (6MWT)       Able to understand and use rate of perceived exertion (RPE) scale  Yes       Intervention  Provide education and explanation on how to use RPE scale       Expected Outcomes  Short Term: Able to use RPE daily in rehab to express subjective intensity level;Long Term:  Able to use RPE to guide intensity level when exercising independently       Knowledge and understanding of Target Heart  Rate Range (THRR)  Yes       Intervention  Provide education and explanation of THRR including how the numbers were predicted and where they are located for reference       Expected Outcomes  Short Term: Able to state/look up THRR;Long Term: Able to use THRR to govern intensity when exercising independently;Short Term: Able to use daily as guideline for intensity in rehab       Able to check pulse independently  Yes       Intervention  Provide education and demonstration on how to check pulse in carotid and radial  arteries.;Review the importance of being able to check your own pulse for safety during independent exercise       Expected Outcomes  Short Term: Able to explain why pulse checking is important during independent exercise;Long Term: Able to check pulse independently and accurately       Understanding of Exercise Prescription  Yes       Intervention  Provide education, explanation, and written materials on patient's individual exercise prescription       Expected Outcomes  Short Term: Able to explain program exercise prescription;Long Term: Able to explain home exercise prescription to exercise independently          Exercise Goals Re-Evaluation : Exercise Goals Re-Evaluation    Row Name 05/16/18 1616 05/16/18 1619           Exercise Goal Re-Evaluation   Exercise Goals Review  Increase Physical Activity;Able to understand and use rate of perceived exertion (RPE) scale;Knowledge and understanding of Target Heart Rate Range (THRR);Understanding of Exercise Prescription;Increase Strength and Stamina;Able to check pulse independently  -      Comments  Reviewed HEP with pt. Also reviewed THRR Range, RPE Scale, endpoints of exercise, NTG use, warmup and cool down.   Reviewed HEP with pt. Also reviewed THRR Range, RPE Scale, endpoints of exercise, NTG use, warmup and cool down.       Expected Outcomes  Pt will continue to walk 5-6 days a week for 45 minutes. Pt will continue to increase cardiovascular strength. Will continue to monitor and progress pt as tolerated.   -         Discharge Exercise Prescription (Final Exercise Prescription Changes): Exercise Prescription Changes - 05/31/18 1500      Response to Exercise   Blood Pressure (Admit)  130/80    Blood Pressure (Exercise)  158/84    Blood Pressure (Exit)  110/66    Heart Rate (Admit)  79 bpm    Heart Rate (Exercise)  115 bpm    Heart Rate (Exit)  86 bpm    Rating of Perceived Exertion (Exercise)  11    Perceived Dyspnea (Exercise)  0     Symptoms  None    Comments  None    Duration  Progress to 30 minutes of  aerobic without signs/symptoms of physical distress    Intensity  THRR unchanged      Progression   Progression  Continue to progress workloads to maintain intensity without signs/symptoms of physical distress.    Average METs  2.3      Resistance Training   Training Prescription  Yes    Weight  2lbs    Reps  10-15    Time  10 Minutes      NuStep   Level  2    SPM  85    Minutes  10    METs  2.2  Arm Ergometer   Level  1    Watts  5    Minutes  10    METs  1.9      Track   Laps  10    Minutes  10    METs  2.76      Home Exercise Plan   Plans to continue exercise at  Home (comment)   Walking   Frequency  Add 3 additional days to program exercise sessions.    Initial Home Exercises Provided  05/15/18       Nutrition:  Target Goals: Understanding of nutrition guidelines, daily intake of sodium 1500mg , cholesterol 200mg , calories 30% from fat and 7% or less from saturated fats, daily to have 5 or more servings of fruits and vegetables.  Biometrics: Pre Biometrics - 04/18/18 1513      Pre Biometrics   Height  5' 3.75" (1.619 m)    Weight  67.6 kg    Waist Circumference  40 inches    Hip Circumference  39.5 inches    Waist to Hip Ratio  1.01 %    BMI (Calculated)  25.79    Triceps Skinfold  32 mm    % Body Fat  31.1 %    Grip Strength  27 kg    Flexibility  7 in    Single Leg Stand  2.56 seconds        Nutrition Therapy Plan and Nutrition Goals: Nutrition Therapy & Goals - 05/01/18 1212      Nutrition Therapy   Diet  carb modified      Personal Nutrition Goals   Nutrition Goal  Pt to identify and limit food sources of saturated fat, trans fat, refined carbohydrates and sodium    Personal Goal #2  Pt to build a healthy plate including vegetables, fruits, whole grains, and low-fat dairy products in a heart healthy meal plan    Personal Goal #3  Pt able to name foods that  affect blood glucose    Personal Goal #4  Pt to add in a snack before exercise, and add a protein to breakfast      Intervention Plan   Intervention  Prescribe, educate and counsel regarding individualized specific dietary modifications aiming towards targeted core components such as weight, hypertension, lipid management, diabetes, heart failure and other comorbidities.    Expected Outcomes  Short Term Goal: Understand basic principles of dietary content, such as calories, fat, sodium, cholesterol and nutrients.;Long Term Goal: Adherence to prescribed nutrition plan.       Nutrition Assessments: Nutrition Assessments - 04/19/18 1045      MEDFICTS Scores   Pre Score  21       Nutrition Goals Re-Evaluation: Nutrition Goals Re-Evaluation    Row Name 04/18/18 1524             Goals   Current Weight  149 lb 0.5 oz (67.6 kg)          Nutrition Goals Re-Evaluation: Nutrition Goals Re-Evaluation    [place] Name 04/18/18 1524             Goals   Current Weight  149 lb 0.5 oz (67.6 kg)          Nutrition Goals Discharge (Final Nutrition Goals Re-Evaluation): Nutrition Goals Re-Evaluation - 04/18/18 1524      Goals   Current Weight  149 lb 0.5 oz (67.6 kg)       Psychosocial: Target Goals: Acknowledge presence or absence of significant  depression and/or stress, maximize coping skills, provide positive support system. Participant is able to verbalize types and ability to use techniques and skills needed for reducing stress and depression.  Initial Review & Psychosocial Screening: Initial Psych Review & Screening - 04/18/18 1343      Initial Review   Current issues with  Current Stress Concerns    Source of Stress Concerns  Chronic Illness      Family Dynamics   Good Support System?  Yes   Austin Lloyd has his wife for support   Comments  Austin Lloyd says he worried about partcipating in rehab again as he previously partcipated in phase 2 cardiac rehab in [place]      Barriers    Psychosocial barriers to participate in program  The patient should benefit from training in stress management and relaxation.      Screening Interventions   Interventions  Encouraged to exercise;To provide support and resources with identified psychosocial needs    Expected Outcomes  Long Term Goal: Stressors or current issues are controlled or eliminated.       Quality of Life Scores: Quality of Life - 04/18/18 1348      Quality of Life   Select  Quality of Life      Quality of Life Scores   Health/Function Pre  19.39 %    Socioeconomic Pre  25.83 %    Psych/Spiritual Pre  28.93 %    Family Pre  27.6 %    GLOBAL Pre  23.97 %      Scores of 19 and below usually indicate a poorer quality of life in these areas.  A difference of  2-3 points is a clinically meaningful difference.  A difference of 2-3 points in the total score of the Quality of Life Index has been associated with significant improvement in overall quality of life, self-image, physical symptoms, and general health in studies assessing change in quality of life.  PHQ-9: Recent Review Flowsheet Data    Depression screen [facility] 2/9 04/24/2018   Decreased Interest 0   Down, Depressed, Hopeless 0   PHQ - 2 Score 0     Interpretation of Total Score  Total Score Depression Severity:  1-4 = Minimal depression, 5-9 = Mild depression, 10-14 = Moderate depression, 15-19 = Moderately severe depression, 20-27 = Severe depression   Psychosocial Evaluation and Intervention: Psychosocial Evaluation - 04/24/18 1531      Psychosocial Evaluation & Interventions   Interventions  Relaxation education;Stress management education;Encouraged to exercise with the program and follow exercise prescription    Comments  Current stress concerns relating to returning to [facility]. Emotional support given.  Austin Lloyd likes to watch TV and build model trains.     Expected Outcomes  Austin Lloyd will maintain a positive outlook and learn to manage his stress.      Continue Psychosocial Services   Follow up required by staff       Psychosocial Re-Evaluation: Psychosocial Re-Evaluation    [place] Name 06/06/18 1608             Psychosocial Re-Evaluation   Current issues with  None Identified       Comments  Austin Lloyd has current concerns related to COVID-19 virus. Emotional support given to pt.       Expected Outcomes  Austin Lloyd will report ability to maintain his stress levels.       Interventions  Encouraged to attend Cardiac Rehabilitation for the exercise       Continue Psychosocial Services  No Follow up required          Psychosocial Discharge (Final Psychosocial Re-Evaluation): Psychosocial Re-Evaluation - 06/06/18 1608      Psychosocial Re-Evaluation   Current issues with  None Identified    Comments  Austin Lloyd has current concerns related to COVID-19 virus. Emotional support given to pt.    Expected Outcomes  Austin Lloyd will report ability to maintain his stress levels.    Interventions  Encouraged to attend Cardiac Rehabilitation for the exercise    Continue Psychosocial Services   No Follow up required       Vocational Rehabilitation: Provide vocational rehab assistance to qualifying candidates.   Vocational Rehab Evaluation & Intervention: Vocational Rehab - 04/18/18 1350      Initial Vocational Rehab Evaluation & Intervention   Assessment shows need for Vocational Rehabilitation  No   Austin Lloyd is retired and does not need vocational rehab for job retraining      Education: Education Goals: Education classes will be provided on a weekly basis, covering required topics. Participant will state understanding/return demonstration of topics presented.  Learning Barriers/Preferences: Learning Barriers/Preferences - 04/18/18 1514      Learning Barriers/Preferences   Learning Barriers  Hearing    Learning Preferences  Written Material;Skilled Demonstration;Verbal Instruction       Education Topics: Count Your Pulse:  -Group instruction  provided by verbal instruction, demonstration, patient participation and written materials to support subject.  Instructors address importance of being able to find your pulse and how to count your pulse when at home without a heart monitor.  Patients get hands on experience counting their pulse with staff help and individually.   CARDIAC REHAB PHASE II EXERCISE from 05/31/2018 in [place]  Date  05/17/18  Educator  RN  Instruction Review Code  2- Demonstrated Understanding      Heart Attack, Angina, and Risk Factor Modification:  -Group instruction provided by verbal instruction, video, and written materials to support subject.  Instructors address signs and symptoms of angina and heart attacks.    Also discuss risk factors for heart disease and how to make changes to improve heart health risk factors.   Functional Fitness:  -Group instruction provided by verbal instruction, demonstration, patient participation, and written materials to support subject.  Instructors address safety measures for doing things around the house.  Discuss how to get up and down off the floor, how to pick things up properly, how to safely get out of a chair without assistance, and balance training.   Meditation and Mindfulness:  -Group instruction provided by verbal instruction, patient participation, and written materials to support subject.  Instructor addresses importance of mindfulness and meditation practice to help reduce stress and improve awareness.  Instructor also leads participants through a meditation exercise.    CARDIAC REHAB PHASE II EXERCISE from 05/31/2018 in [place]  Date  05/22/18  Instruction Review Code  2- Demonstrated Understanding      Stretching for Flexibility and Mobility:  -Group instruction provided by verbal instruction, patient participation, and written materials to support subject.  Instructors lead participants  through series of stretches that are designed to increase flexibility thus improving mobility.  These stretches are additional exercise for major muscle groups that are typically performed during regular warm up and cool down.   Hands Only CPR:  -Group verbal, video, and participation provides a basic overview of AHA guidelines for community CPR. Role-play of emergencies allow participants the opportunity  to practice calling for help and chest compression technique with discussion of AED use.   Hypertension: -Group verbal and written instruction that provides a basic overview of hypertension including the most recent diagnostic guidelines, risk factor reduction with self-care instructions and medication management.   CARDIAC REHAB PHASE II EXERCISE from 05/31/2018 in [place]  Date  05/31/18  Educator  RN  Instruction Review Code  2- Demonstrated Understanding       Nutrition I class: Heart Healthy Eating:  -Group instruction provided by PowerPoint slides, verbal discussion, and written materials to support subject matter. The instructor gives an explanation and review of the Therapeutic Lifestyle Changes diet recommendations, which includes a discussion on lipid goals, dietary fat, sodium, fiber, plant stanol/sterol esters, sugar, and the components of a well-balanced, healthy diet.   Nutrition II class: Lifestyle Skills:  -Group instruction provided by PowerPoint slides, verbal discussion, and written materials to support subject matter. The instructor gives an explanation and review of label reading, grocery shopping for heart health, heart healthy recipe modifications, and ways to make healthier choices when eating out.   Diabetes Question & Answer:  -Group instruction provided by PowerPoint slides, verbal discussion, and written materials to support subject matter. The instructor gives an explanation and review of diabetes co-morbidities, pre- and  post-prandial blood glucose goals, pre-exercise blood glucose goals, signs, symptoms, and treatment of hypoglycemia and hyperglycemia, and foot care basics.   Diabetes Blitz:  -Group instruction provided by PowerPoint slides, verbal discussion, and written materials to support subject matter. The instructor gives an explanation and review of the physiology behind type 1 and type 2 diabetes, diabetes medications and rational behind using different medications, pre- and post-prandial blood glucose recommendations and Hemoglobin A1c goals, diabetes diet, and exercise including blood glucose guidelines for exercising safely.    Portion Distortion:  -Group instruction provided by PowerPoint slides, verbal discussion, written materials, and food models to support subject matter. The instructor gives an explanation of serving size versus portion size, changes in portions sizes over the last 20 years, and what consists of a serving from each food group.   Stress Management:  -Group instruction provided by verbal instruction, video, and written materials to support subject matter.  Instructors review role of stress in heart disease and how to cope with stress positively.     Exercising on Your Own:  -Group instruction provided by verbal instruction, power point, and written materials to support subject.  Instructors discuss benefits of exercise, components of exercise, frequency and intensity of exercise, and end points for exercise.  Also discuss use of nitroglycerin and activating EMS.  Review options of places to exercise outside of rehab.  Review guidelines for sex with heart disease.   CARDIAC REHAB PHASE II EXERCISE from 05/31/2018 in [place]  Date  05/15/18  Educator  EP  Instruction Review Code  2- Demonstrated Understanding      Cardiac Drugs I:  -Group instruction provided by verbal instruction and written materials to support subject.  Instructor reviews  cardiac drug classes: antiplatelets, anticoagulants, beta blockers, and statins.  Instructor discusses reasons, side effects, and lifestyle considerations for each drug class.   Cardiac Drugs II:  -Group instruction provided by verbal instruction and written materials to support subject.  Instructor reviews cardiac drug classes: angiotensin converting enzyme inhibitors (ACE-I), angiotensin II receptor blockers (ARBs), nitrates, and calcium channel blockers.  Instructor discusses reasons, side effects, and lifestyle considerations for each drug  class.   CARDIAC REHAB PHASE II EXERCISE from 05/31/2018 in [place]  Date  05/01/18  Instruction Review Code  2- Demonstrated Understanding      Anatomy and Physiology of the Circulatory System:  Group verbal and written instruction and models provide basic cardiac anatomy and physiology, with the coronary electrical and arterial systems. Review of: AMI, Angina, Valve disease, Heart Failure, Peripheral Artery Disease, Cardiac Arrhythmia, Pacemakers, and the ICD.   Other Education:  -Group or individual verbal, written, or video instructions that support the educational goals of the cardiac rehab program.   Holiday Eating Survival Tips:  -Group instruction provided by PowerPoint slides, verbal discussion, and written materials to support subject matter. The instructor gives patients tips, tricks, and techniques to help them not only survive but enjoy the holidays despite the onslaught of food that accompanies the holidays.   Knowledge Questionnaire Score: Knowledge Questionnaire Score - 04/18/18 1347      Knowledge Questionnaire Score   Pre Score  20/24       Core Components/Risk Factors/Patient Goals at Admission: Personal Goals and Risk Factors at Admission - 04/18/18 1514      Core Components/Risk Factors/Patient Goals on Admission    Weight Management  Yes;Weight Loss    Intervention  Weight Management:  Develop a combined nutrition and exercise program designed to reach desired caloric intake, while maintaining appropriate intake of nutrient and fiber, sodium and fats, and appropriate energy expenditure required for the weight goal.;Weight Management: Provide education and appropriate resources to help participant work on and attain dietary goals.;Weight Management/Obesity: Establish reasonable short term and long term weight goals.    Admit Weight  149 lb 0.5 oz (67.6 kg)    Goal Weight: Short Term  145 lb (65.8 kg)    Goal Weight: Long Term  140 lb (63.5 kg)    Expected Outcomes  Short Term: Continue to assess and modify interventions until short term weight is achieved;Long Term: Adherence to nutrition and physical activity/exercise program aimed toward attainment of established weight goal;Weight Maintenance: Understanding of the daily nutrition guidelines, which includes 25-35% calories from fat, 7% or less cal from saturated fats, less than 200mg  cholesterol, less than 1.5gm of sodium, & 5 or more servings of fruits and vegetables daily;Weight Loss: Understanding of general recommendations for a balanced deficit meal plan, which promotes 1-2 lb weight loss per week and includes a negative energy balance of [phone] kcal/d;Understanding recommendations for meals to include 15-35% energy as protein, 25-35% energy from fat, 35-60% energy from carbohydrates, less than 200mg  of dietary cholesterol, 20-35 gm of total fiber daily;Understanding of distribution of calorie intake throughout the day with the consumption of 4-5 meals/snacks    Diabetes  Yes    Intervention  Provide education about signs/symptoms and action to take for hypo/hyperglycemia.;Provide education about proper nutrition, including hydration, and aerobic/resistive exercise prescription along with prescribed medications to achieve blood glucose in normal ranges: Fasting glucose 65-99 mg/dL    Expected Outcomes  Short Term: Participant  verbalizes understanding of the signs/symptoms and immediate care of hyper/hypoglycemia, proper foot care and importance of medication, aerobic/resistive exercise and nutrition plan for blood glucose control.;Long Term: Attainment of HbA1C < 7%.    Heart Failure  Yes    Intervention  Provide a combined exercise and nutrition program that is supplemented with education, support and counseling about heart failure. Directed toward relieving symptoms such as shortness of breath, decreased exercise tolerance, and extremity edema.  Expected Outcomes  Improve functional capacity of life;Short term: Attendance in program 2-3 days a week with increased exercise capacity. Reported lower sodium intake. Reported increased fruit and vegetable intake. Reports medication compliance.;Short term: Daily weights obtained and reported for increase. Utilizing diuretic protocols set by physician.;Long term: Adoption of self-care skills and reduction of barriers for early signs and symptoms recognition and intervention leading to self-care maintenance.    Hypertension  Yes    Intervention  Provide education on lifestyle modifcations including regular physical activity/exercise, weight management, moderate sodium restriction and increased consumption of fresh fruit, vegetables, and low fat dairy, alcohol moderation, and smoking cessation.;Monitor prescription use compliance.    Expected Outcomes  Short Term: Continued assessment and intervention until BP is < 140/29mm HG in hypertensive participants. < 130/62mm HG in hypertensive participants with diabetes, heart failure or chronic kidney disease.;Long Term: Maintenance of blood pressure at goal levels.    Lipids  Yes    Intervention  Provide education and support for participant on nutrition & aerobic/resistive exercise along with prescribed medications to achieve LDL 70mg , HDL >40mg .    Expected Outcomes  Long Term: Cholesterol controlled with medications as prescribed, with  individualized exercise RX and with personalized nutrition plan. Value goals: LDL < 70mg , HDL > 40 mg.;Short Term: Participant states understanding of desired cholesterol values and is compliant with medications prescribed. Participant is following exercise prescription and nutrition guidelines.       Core Components/Risk Factors/Patient Goals Review:  Goals and Risk Factor Review    Row Name 04/24/18 1534 05/23/18 1417 06/06/18 1615         Core Components/Risk Factors/Patient Goals Review   Personal Goals Review  Weight Management/Obesity;Lipids;Heart Failure;Diabetes;Hypertension  Weight Management/Obesity;Lipids;Heart Failure;Diabetes;Hypertension  Weight Management/Obesity;Lipids;Heart Failure;Diabetes;Hypertension     Review  Pt with multiple CAD RFs willing to participate in CR exercise.  Austin Lloyd would like to increase his walking speed.  Pt with multiple CAD RFs willing to participate in CR exercise.  Austin Lloyd is tolerating exercise well.  VSS.  He plans to only complete 26 sessions.   Pt continues to tolerate exercise well.  Exercise currently on hold as department is closed per recommended guidelines from federal government to prevent spread of COVID-19.      Expected Outcomes  Pt will continue to participate in CR exercise, nutrition, and lifestyle modification opportunities.   Pt will continue to participate in CR exercise, nutrition, and lifestyle modification opportunities.   Pt will continue to participate in exercise, nutrition, and lifestyle modification opportunities.         Core Components/Risk Factors/Patient Goals at Discharge (Final Review):  Goals and Risk Factor Review - 06/06/18 1615      Core Components/Risk Factors/Patient Goals Review   Personal Goals Review  Weight Management/Obesity;Lipids;Heart Failure;Diabetes;Hypertension    Review  Pt continues to tolerate exercise well.  Exercise currently on hold as department is closed per recommended guidelines from federal  government to prevent spread of COVID-19.     Expected Outcomes  Pt will continue to participate in exercise, nutrition, and lifestyle modification opportunities.        ITP Comments: ITP Comments    Row Name 04/18/18 1337 04/24/18 1531 05/23/18 1415 06/06/18 1608     ITP Comments  Dr. Fransico Him, Medical Director  30 Day ITP Review. Fred started exercise today and tolerated it well.   30 Day ITP Review. Austin Lloyd is tolerating exercise well.  VSS.  He plans to only complete 26 sessions.   [place]  Day ITP Review. Pt continues to tolerate exercise well.  Exercise currently on hold as department is closed per recommended guidelines from federal government to prevent spread of COVID-19.        Comments: See ITP Comments.

## 2018-06-13 NOTE — Progress Notes (Signed)
Virtual Visit via Telephone Note    Evaluation Performed:  Follow-up visit  This visit type was conducted due to national recommendations for restrictions regarding the COVID-19 Pandemic (e.g. social distancing).  This format is felt to be most appropriate for this patient at this time.  All issues noted in this document were discussed and addressed.  No physical exam was performed (except for noted visual exam findings with Video Visits).  Please refer to the patient's chart (MyChart message for video visits and phone note for telephone visits) for the patient's consent to telehealth for [facility].  Date:  06/13/2018   ID:  Austin Lloyd, DOB 15-Nov-1929, MRN [id number]  Patient Location:  [address] [address] [place] [address]   Provider location:   [place]  PCP:  Shelda Pal, DO  Cardiologist:  No primary care provider on file.  Electrophysiologist:  None   Chief Complaint:  SOB  History of Present Illness:    Austin Lloyd is a 85 y.o. male who presents via audio/video conferencing for a telehealth visit today.     He has a history of bladder cancer,lung cancer s/p RUL lobectomy,prostate cancer,CLL, CAD s/p 2V CABG in 2016 ([place]), diabetes, HTN, ischemic cardiomyopathy,CKD stage III,PVCsand severe aortic stenosis s/p TAVR who presents to clinic for follow up.   He underwent successful TAVR with a52mm Edwards Sapien 3 THV via the TF approach on 02/05/18. Post operative echoshowed EF 40-45%, normally functioning TAVR with trivial PVL; mean gradient 8 mm Hg.  Since I last saw him he has done well. He sleeps in a regular bed.  He is breathing better than before his surgery.  The patient denies any new symptoms such as chest discomfort, neck or arm discomfort. There has been no new shortness of breath, PND or orthopnea. There have been no reported palpitations, presyncope or syncope. Of note he did have cough and got check for coronavirus.   This was not resulted at the time of this appt (I did check.  Since then it did come back negative.)      The patient does not symptoms concerning for COVID-19 infection (fever, chills, cough, or new SHORTNESS OF BREATH).    Prior CV studies:   The following studies were reviewed today:  Labs as above  Past Medical History:  Diagnosis Date  . Aortic stenosis   . Bladder cancer ([place])    T1 status post BCG  . 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 Coronary artery disease with history of myocardial infarction without history of CABG    LIMA to the LAD, SVG to RCA 2016 for treatment of the percent left main stenosis and 80% RCA stenosis.    . Depression   . Diabetes mellitus, type II ([place])   . Hypertension   . Ischemic cardiomyopathy    EF 40% 2019  . Lung cancer ([place])    Adenocarcinoma of the right lung moderately differentiated  . Lung mass   . PVC's (premature ventricular contractions)   . S/P TAVR (transcatheter aortic valve replacement)    Past Surgical History:  Procedure Laterality Date  . CORONARY ARTERY BYPASS GRAFT     2014  . INGUINAL HERNIA REPAIR    . INTRAOPERATIVE TRANSTHORACIC ECHOCARDIOGRAM  02/05/2018   Procedure: INTRAOPERATIVE TRANSTHORACIC ECHOCARDIOGRAM;  Surgeon: [name], MD;  Location: [facility] OR;  Service: Open Heart Surgery;;  . LUNG REMOVAL, PARTIAL    .  RIGHT/LEFT HEART CATH AND CORONARY/GRAFT ANGIOGRAPHY N/A 12/21/2017   Procedure: RIGHT/LEFT HEART CATH AND CORONARY/GRAFT ANGIOGRAPHY;  Surgeon: [name], MD;  Location: [facility];  Service: Cardiovascular;  Laterality: N/A;  . TRANSCATHETER AORTIC VALVE REPLACEMENT, TRANSFEMORAL  02/05/2018   TRANSCATHETER AORTIC VALVE REPLACEMENT, TRANSFEMORAL (  . TRANSCATHETER AORTIC VALVE REPLACEMENT, TRANSFEMORAL N/A 02/05/2018   Procedure: TRANSCATHETER AORTIC VALVE REPLACEMENT, TRANSFEMORAL;  Surgeon:  [name], MD;  Location: [facility];  Service: Open Heart Surgery;  Laterality: N/A;  . Ulcer surgery       Current Meds  Medication Sig  . ACCU-CHEK SOFTCLIX LANCETS lancets Test blood sugar three times a day.  DX E11.9  . aspirin EC 81 MG tablet Take 81 mg by mouth every evening.   . clopidogrel (PLAVIX) 75 MG tablet Take 1 tablet (75 mg total) by mouth daily with breakfast.  . ferrous sulfate 325 (65 FE) MG tablet Take 325 mg by mouth daily with breakfast.  . furosemide (LASIX) 40 MG tablet Take 1 tablet (40 mg total) by mouth daily.  Marland Kitchen glucose blood (ACCU-CHEK AVIVA PLUS) test strip Test blood sugar three times a day.  DX E11.9  . insulin detemir (LEVEMIR) 100 UNIT/ML injection Inject 0.31 mLs (31 Units total) into the skin daily.  . Insulin Pen Needle 31G X 8 MM MISC To use with insulin  . levocetirizine (XYZAL) 5 MG tablet Take 1 tablet (5 mg total) by mouth every evening.  Marland Kitchen losartan (COZAAR) 25 MG tablet Take 12.5 mg by mouth daily.   Marland Kitchen lovastatin (MEVACOR) 40 MG tablet Take 40 mg by mouth at bedtime.  . Melatonin 5 MG TABS Take 5 mg by mouth at bedtime as needed (for sleep).   . metoprolol succinate (TOPROL-XL) 25 MG 24 hr tablet Take 25 mg by mouth 2 (two) times daily.   . Multiple Vitamin (MULTIVITAMIN) tablet Take 1 tablet by mouth daily.  . tamsulosin (FLOMAX) 0.4 MG CAPS capsule Take 1 capsule (0.4 mg total) by mouth daily.  . traZODone (DESYREL) 50 MG tablet Take 0.5-1 tablets (25-50 mg total) by mouth at bedtime as needed for sleep.     Allergies:   Patient has no known allergies.   Social History   Tobacco Use  . Smoking status: Former Smoker    Packs/day: 0.20    Years: 30.00    Pack years: 6.00    Types: Cigarettes  . Smokeless tobacco: Never Used  Substance Use Topics  . Alcohol use: Yes    Alcohol/week: 2.0 standard drinks    Types: 2 Cans of beer per week    Comment: 2 cans beer per week.  . Drug use: Never     Family Hx: The patient's  family history includes AAA (abdominal aortic aneurysm) in his mother; Cancer in his father.  ROS:   Please see the history of present illness.    As stated in the HPI and negative for all other systems.   Labs/Other Tests and Data Reviewed:    Recent Labs: 11/06/2017: TSH 2.500 02/01/2018: B Natriuretic Peptide 447.9 02/06/2018: Magnesium 1.8 03/11/2018: NT-Pro BNP 2,881 03/27/2018: ALT 23; BUN 37; Creatinine 1.63; Hemoglobin 12.3; Platelets 204; Potassium 5.0; Sodium 138   Recent Lipid Panel Lab Results  Component Value Date/Time   CHOL 183 11/06/2017 08:15 AM   TRIG 86 11/06/2017 08:15 AM   HDL 58 11/06/2017 08:15 AM   CHOLHDL 3.2 11/06/2017 08:15 AM   LDLCALC 108 (H) 11/06/2017 08:15 AM  Wt Readings from Last 3 Encounters:  06/13/18 148 lb (67.1 kg)  06/06/18 148 lb 8 oz (67.4 kg)  05/30/18 150 lb 8 oz (68.3 kg)     Exam:    Vital Signs:  Ht 5\' 4"  (1.626 m)   Wt 148 lb (67.1 kg)   BMI 25.40 kg/m      ASSESSMENT & PLAN:    Severe AS s/p TAVR:   Today I told him that he can stop his Plavix at the end end of the May.   He does not want rehab because he is doing that on his own himself.  I told him that this was fine.       Chronic combined S/D CHF:      Of note his most recent echo was down to 25 - 30%in Dec.  However, he has no overt symptoms and I will not titrate meds until we can see him back in the office.   CKD stage III:   Baseline creat 1-4-1.48.  This was slightly higher in Jan but I think it will be fine, without med titration, to check this again in 2 months.    HTN: BP well controlled.   Continue current therapy.  History of lung cancer:  He will continue follow up per primary care.   CAD s/p CABG: pre TAVR cath showed patent bypass grafts.    COVID-19 Education: The signs and symptoms of COVID-19 were discussed with the patient and how to seek care for testing (follow up with PCP or arrange E-visit).  The importance of social distancing was  discussed today.  Patient Risk:   After full review of this patients clinical status, I feel that they are at least moderate risk at this time.  Time:   Today, I have spent 25 minutes with the patient with telehealth technology discussing .     Medication Adjustments/Labs and Tests Ordered: Current medicines are reviewed at length with the patient today.  Concerns regarding medicines are outlined above.  Tests Ordered: No orders of the defined types were placed in this encounter.  Medication Changes: No orders of the defined types were placed in this encounter.   Disposition:  Two months  Signed, [name], MD  06/13/2018 1:55 PM    [place]

## 2018-06-14 ENCOUNTER — Encounter (HOSPITAL_COMMUNITY): Payer: Medicare Other

## 2018-06-14 ENCOUNTER — Ambulatory Visit (HOSPITAL_COMMUNITY): Payer: Medicare Other

## 2018-06-16 ENCOUNTER — Encounter: Payer: Self-pay | Admitting: Cardiology

## 2018-06-16 DIAGNOSIS — N183 Chronic kidney disease, stage 3 unspecified: Secondary | ICD-10-CM | POA: Insufficient documentation

## 2018-06-16 LAB — NOVEL CORONAVIRUS, NAA: SARS-CoV-2, NAA: NOT DETECTED

## 2018-06-17 ENCOUNTER — Ambulatory Visit (HOSPITAL_COMMUNITY): Payer: Medicare Other

## 2018-06-17 ENCOUNTER — Other Ambulatory Visit: Payer: Self-pay | Admitting: Family Medicine

## 2018-06-17 ENCOUNTER — Encounter (HOSPITAL_COMMUNITY): Payer: Medicare Other

## 2018-06-17 DIAGNOSIS — R05 Cough: Secondary | ICD-10-CM

## 2018-06-17 DIAGNOSIS — R059 Cough, unspecified: Secondary | ICD-10-CM

## 2018-06-17 NOTE — Progress Notes (Signed)
ct 

## 2018-06-17 NOTE — Progress Notes (Signed)
CT

## 2018-06-17 NOTE — Progress Notes (Signed)
CT chest ordered and awaiting approval.

## 2018-06-18 ENCOUNTER — Other Ambulatory Visit: Payer: Self-pay | Admitting: Family Medicine

## 2018-06-18 ENCOUNTER — Ambulatory Visit (HOSPITAL_BASED_OUTPATIENT_CLINIC_OR_DEPARTMENT_OTHER)
Admission: RE | Admit: 2018-06-18 | Discharge: 2018-06-18 | Disposition: A | Discharge status: Home / Self Care | Payer: Medicare Other | Source: Ambulatory Visit | Attending: Family Medicine | Admitting: Family Medicine

## 2018-06-18 ENCOUNTER — Other Ambulatory Visit: Payer: Self-pay

## 2018-06-18 DIAGNOSIS — R05 Cough: Secondary | ICD-10-CM | POA: Diagnosis not present

## 2018-06-18 DIAGNOSIS — R059 Cough, unspecified: Secondary | ICD-10-CM

## 2018-06-18 DIAGNOSIS — R918 Other nonspecific abnormal finding of lung field: Secondary | ICD-10-CM | POA: Diagnosis not present

## 2018-06-18 MED ORDER — AMOXICILLIN-POT CLAVULANATE 875-125 MG PO TABS: 1.0000 | ORAL_TABLET | Freq: Two times a day (BID) | ORAL | 0 refills | Status: DC

## 2018-06-19 ENCOUNTER — Ambulatory Visit (HOSPITAL_COMMUNITY): Payer: Medicare Other

## 2018-06-19 ENCOUNTER — Encounter (HOSPITAL_COMMUNITY): Payer: Medicare Other

## 2018-06-21 ENCOUNTER — Ambulatory Visit (HOSPITAL_COMMUNITY): Payer: Medicare Other

## 2018-06-21 ENCOUNTER — Encounter (HOSPITAL_COMMUNITY): Payer: Medicare Other

## 2018-06-24 ENCOUNTER — Encounter (HOSPITAL_COMMUNITY): Payer: Medicare Other

## 2018-06-24 ENCOUNTER — Telehealth: Payer: Self-pay | Admitting: *Deleted

## 2018-06-24 ENCOUNTER — Ambulatory Visit (HOSPITAL_COMMUNITY): Payer: Medicare Other

## 2018-06-24 NOTE — Telephone Encounter (Signed)
Contacted patient per [facility] current rescheduling guidelines. Spoke with patient/daughter. After reviewing chart, Dr.Kale thinks it is appropriate to reschedule patient appts for lab/MD for 2 months in the future. Informed patient/daughter that scheduling will contact them. They verbalized understanding. Appts for 4/7 cancelled. Schedule msg sent.

## 2018-06-25 ENCOUNTER — Inpatient Hospital Stay: Payer: Medicare Other

## 2018-06-25 ENCOUNTER — Inpatient Hospital Stay: Payer: Medicare Other | Admitting: Hematology

## 2018-06-26 ENCOUNTER — Encounter (HOSPITAL_COMMUNITY): Payer: Medicare Other

## 2018-06-26 ENCOUNTER — Ambulatory Visit (HOSPITAL_COMMUNITY): Payer: Medicare Other

## 2018-06-27 ENCOUNTER — Telehealth: Payer: Self-pay | Admitting: Hematology

## 2018-06-27 NOTE — Telephone Encounter (Signed)
Scheduled per sch msg. Mailed printout.

## 2018-06-28 ENCOUNTER — Ambulatory Visit (HOSPITAL_COMMUNITY): Payer: Medicare Other

## 2018-06-28 ENCOUNTER — Encounter (HOSPITAL_COMMUNITY): Payer: Medicare Other

## 2018-07-01 ENCOUNTER — Encounter (HOSPITAL_COMMUNITY): Payer: Medicare Other

## 2018-07-01 ENCOUNTER — Ambulatory Visit (HOSPITAL_COMMUNITY): Payer: Medicare Other

## 2018-07-03 ENCOUNTER — Encounter (HOSPITAL_COMMUNITY): Payer: Medicare Other

## 2018-07-03 ENCOUNTER — Ambulatory Visit (HOSPITAL_COMMUNITY): Payer: Medicare Other

## 2018-07-05 ENCOUNTER — Encounter (HOSPITAL_COMMUNITY): Payer: Medicare Other

## 2018-07-05 ENCOUNTER — Ambulatory Visit (HOSPITAL_COMMUNITY): Payer: Medicare Other

## 2018-07-08 ENCOUNTER — Encounter (HOSPITAL_COMMUNITY): Payer: Medicare Other

## 2018-07-08 ENCOUNTER — Ambulatory Visit (HOSPITAL_COMMUNITY): Payer: Medicare Other

## 2018-07-10 ENCOUNTER — Ambulatory Visit (HOSPITAL_COMMUNITY): Payer: Medicare Other

## 2018-07-10 ENCOUNTER — Ambulatory Visit: Payer: Medicare Other | Admitting: Family Medicine

## 2018-07-10 ENCOUNTER — Encounter (HOSPITAL_COMMUNITY): Payer: Medicare Other

## 2018-07-12 ENCOUNTER — Encounter (HOSPITAL_COMMUNITY): Payer: Medicare Other

## 2018-07-12 ENCOUNTER — Ambulatory Visit (HOSPITAL_COMMUNITY): Payer: Medicare Other

## 2018-07-15 ENCOUNTER — Ambulatory Visit (HOSPITAL_COMMUNITY): Payer: Medicare Other

## 2018-07-15 ENCOUNTER — Encounter (HOSPITAL_COMMUNITY): Payer: Medicare Other

## 2018-07-17 ENCOUNTER — Encounter (HOSPITAL_COMMUNITY): Payer: Medicare Other

## 2018-07-17 ENCOUNTER — Ambulatory Visit (HOSPITAL_COMMUNITY): Payer: Medicare Other

## 2018-07-18 ENCOUNTER — Ambulatory Visit: Payer: Medicare Other | Admitting: Family Medicine

## 2018-07-19 ENCOUNTER — Encounter (HOSPITAL_COMMUNITY): Payer: Medicare Other

## 2018-07-19 ENCOUNTER — Ambulatory Visit (HOSPITAL_COMMUNITY): Payer: Medicare Other

## 2018-07-22 ENCOUNTER — Encounter (HOSPITAL_COMMUNITY): Payer: Medicare Other

## 2018-07-22 ENCOUNTER — Ambulatory Visit (HOSPITAL_COMMUNITY): Payer: Medicare Other

## 2018-07-24 ENCOUNTER — Ambulatory Visit (HOSPITAL_COMMUNITY): Payer: Medicare Other

## 2018-07-24 ENCOUNTER — Encounter (HOSPITAL_COMMUNITY): Payer: Medicare Other

## 2018-07-26 ENCOUNTER — Ambulatory Visit (HOSPITAL_COMMUNITY): Payer: Medicare Other

## 2018-07-26 ENCOUNTER — Encounter (HOSPITAL_COMMUNITY): Payer: Medicare Other

## 2018-08-15 ENCOUNTER — Telehealth (HOSPITAL_COMMUNITY): Payer: Self-pay

## 2018-08-15 NOTE — Telephone Encounter (Signed)
Phone call made to pt to provide information about virtual cardiac rehab. Pt was responsive and stated he did not want to continue with cardiac rehab. Pt stated he spoke with his doctor and doctor approved discharge from cardiac rehab. Pt stated he is walking 1-2 miles everyday and is feeling fine. Pt will be discharged from cardiac rehab.

## 2018-08-16 NOTE — Progress Notes (Signed)
HEMATOLOGY/ONCOLOGY CLINIC NOTE  Date of Service: 08/19/2018  Patient Care Team: Shelda Pal, DO as PCP - General (Family Medicine)  CHIEF COMPLAINTS/PURPOSE OF CONSULTATION:  History of CLL and NSCLC  Oncologic History:   Austin Lloyd [facility] was diagnosed with CLL in 2012 and was treated with one cycle on Bendamustine and possibly Rituxan (incomplete records) before developing acute kidney injury and then completing 5 cycles of Gazyva and Chlorambucil.   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 Treated from 06/13/16 to 06/23/16 with stereotactic body radiotherapy to right upper lobe, total dose of 5000cGy in 5 fractions   Bladder cancer prior to 2010. Low grade prostate cancer currently.   HISTORY OF PRESENTING ILLNESS:   Austin Lloyd is a wonderful 85 y.o. male who has been referred to [place] by Dr. Riki Sheer for evaluation and management of history of CLL and non-small cell lung cancer. He is accompanied today by his wife. The pt reports that he is doing well overall and has enjoyed his recent move from [place].   The pt notes that he has had stable energy levels. He notes that he has not had any changes in breathing and has not developed any constitutional symptom, denying fevers, chills, night sweats and unexpected weight loss. The pt notes that he has not been able to walk as far as he used to and has to sit down often.   The pt reports that his elevated WBC was incidentally picked up while living in [place] in 2012. The pt notes that his treatment with Bendamustine, and possibly Rituxan, was complicated by CHF and acute kidney injury and he notes that he had two infusions at the time.   The pt notes that his lung cancer was initially  diagnosed in 2015 and was resected. It then recurred in 2018 and was treated with radiation.   The pt has had three squamous cell carcinomas, one under left eye, one under left lear, and on occipital scalp. He also has had a basal cell carcinoma. He will be establishing care with dermatology in October.  The pt notes that his bladder cancer and prostate cancer has received BCG and denies radiation to his prostate. He has begun care with urology Dr. Louis Meckel.   He has just begun seeing Dr. [name] in Cardiology as well for his history of CHF, CAD, and aortic stenosis. The pt had an MI in 2015 with subsequent CABG x2.   Most recent lab results (11/06/17) of CBC and CMP is as follows: all values are WNL except for HGB at 12.9, RDW at 15.6, BUN at 33, Creatinine at 1.47, GFR at 43.  On review of systems, pt reports stable energy levels, and denies fevers, chills, night sweats, unexpected weight loss, noticing any new lumps or bumps, changes in breathing, blood in the urine, abdominal pains, chest pain, and any other symptoms.   On PMHx the pt reports DM type II, CKD stage III, CAD s/p two vessel CABG, CLL, invasive adenocarcinoma. 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 Basal cell carcinoma, high grade bladder cancer, low grade prostate cancer, MI in June 2015, acute decompensated heart failure. On Social Hx the pt reports quit smoking cigarettes more than 45 years ago  Interval History:  Austin Lloyd [facility] returns today for management and evaluation of his CLL and history of NSCLC. The patient's last visit with [place] was on 03/27/18. The pt reports that he is doing well overall.  The pt reports that he has not developed any new concerns in the interim. He denies any present concerns for infections and completed a course of Augmentin with his PCP for suspected aspirational pneumonia. The pt denies any problems swallowing or concern for choking. He has begun  taking levocetrizine in the interim. He has been continuing to walk every day, and notes that he walks slow. He notes that we walks a mile and a half in about an hour. The pt notes that he saw Dr. [name] in Cardiology in the interim.   The pt denies noticing any new lumps or bumps and notes that his weight has been stable. He denies fevers, chills, or night sweats.  Of note since the patient's last visit, pt has had a CT Chest completed on 06/18/18 with results revealing "Stable RIGHT suprahilar mass most consists with post the radiation / surgical therapy change. 2. Waxing waning airspace disease/nodularity in the LEFT lower lobe with increased nodularity. Findings atypical for carcinoma. Consider aspiration pneumonia. Recommend follow-up CT exam in 1-3 months. 3. Stable ground-glass nodules in the upper lobe are indeterminate. Recommend attention on routine oncology surveillance."  Lab results today (08/19/18) of CBC w/diff and CMP is as follows: all values are WNL except for Potassium at 5.2, Glucose at 305, BUN at 48, Creatinine at 1.77, GFR at 34. 08/19/18 LDH at 291  On review of systems, pt reports stable energy levels, walking each day, and denies problems swallowing, concerns for infections, new lumps or bumps, unexpected weight loss, fevers, chills, night sweats, and any other symptoms.   MEDICAL HISTORY:  Past Medical History:  Diagnosis Date  . Aortic stenosis   . Bladder cancer ([place])    T1 status post BCG  . 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 Coronary artery disease with history of myocardial infarction without history of CABG    LIMA to the LAD, SVG to RCA 2016 for treatment of the percent left main stenosis and 80% RCA stenosis.    . Depression   . Diabetes mellitus, type II ([place])   . Hypertension   . Ischemic cardiomyopathy    EF 40% 2019  . Lung cancer ([place])    Adenocarcinoma of the right lung  moderately differentiated  . Lung mass   . PVC's (premature ventricular contractions)   . S/P TAVR (transcatheter aortic valve replacement)     SURGICAL HISTORY: Past Surgical History:  Procedure Laterality Date  . CORONARY ARTERY BYPASS GRAFT     2014  . INGUINAL HERNIA REPAIR    . INTRAOPERATIVE TRANSTHORACIC ECHOCARDIOGRAM  02/05/2018   Procedure: INTRAOPERATIVE TRANSTHORACIC ECHOCARDIOGRAM;  Surgeon: [name], MD;  Location: [facility] OR;  Service: Open Heart Surgery;;  . LUNG REMOVAL, PARTIAL    . RIGHT/LEFT HEART CATH AND CORONARY/GRAFT ANGIOGRAPHY N/A 12/21/2017   Procedure: RIGHT/LEFT HEART CATH AND CORONARY/GRAFT ANGIOGRAPHY;  Surgeon: [name], MD;  Location: [place] CV LAB;  Service: Cardiovascular;  Laterality: N/A;  . TRANSCATHETER AORTIC VALVE REPLACEMENT, TRANSFEMORAL  02/05/2018   TRANSCATHETER AORTIC VALVE REPLACEMENT, TRANSFEMORAL (  . TRANSCATHETER AORTIC VALVE REPLACEMENT, TRANSFEMORAL N/A 02/05/2018   Procedure: TRANSCATHETER AORTIC VALVE REPLACEMENT, TRANSFEMORAL;  Surgeon: [name], MD;  Location: [place];  Service:  Open Heart Surgery;  Laterality: N/A;  . Ulcer surgery      SOCIAL HISTORY: Social History   Socioeconomic History  . Marital status: Married    Spouse name: Not on file  . Number of children: 3  . Years of education: Not on file  . Highest education level: Not on file  Occupational History  . 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 Financial resource strain: Not on file  . Food insecurity:    Worry: Not on file    Inability: Not on file  . Transportation needs:    Medical: Not on file    Non-medical: Not on file  Tobacco Use  . Smoking status: Former Smoker    Packs/day: 0.20    Years: 30.00    Pack years: 6.00    Types: Cigarettes  . Smokeless tobacco: Never Used  Substance and Sexual Activity  . Alcohol use: Yes    Alcohol/week: 2.0 standard drinks     Types: 2 Cans of beer per week    Comment: 2 cans beer per week.  . Drug use: Never  . Sexual activity: Not on file  Lifestyle  . Physical activity:    Days per week: Not on file    Minutes per session: Not on file  . Stress: Not on file  Relationships  . Social connections:    Talks on phone: Not on file    Gets together: Not on file    Attends religious service: Not on file    Active member of club or organization: Not on file    Attends meetings of clubs or organizations: Not on file    Relationship status: Not on file  . Intimate partner violence:    Fear of current or ex partner: Not on file    Emotionally abused: Not on file    Physically abused: Not on file    Forced sexual activity: Not on file  Other Topics Concern  . Not on file  Social History Narrative   Lives with wife daughter.  Three children and one grandchild.     FAMILY HISTORY: Family History  Problem Relation Age of Onset  . AAA (abdominal aortic aneurysm) Mother   . Cancer Father        Brain tumor    ALLERGIES:  has No Known Allergies.  MEDICATIONS:  Current Outpatient Medications  Medication Sig Dispense Refill  . ACCU-CHEK SOFTCLIX LANCETS lancets Test blood sugar three times a day.  DX E11.9 200 each 6  . amoxicillin-clavulanate (AUGMENTIN) 875-125 MG tablet Take 1 tablet by mouth 2 (two) times daily. 20 tablet 0  . aspirin EC 81 MG tablet Take 81 mg by mouth every evening.     . clopidogrel (PLAVIX) 75 MG tablet Take 1 tablet (75 mg total) by mouth daily with breakfast. 90 tablet 1  . ferrous sulfate 325 (65 FE) MG tablet Take 325 mg by mouth daily with breakfast.    . furosemide (LASIX) 40 MG tablet Take 1 tablet (40 mg total) by mouth daily. 90 tablet 3  . glucose blood (ACCU-CHEK AVIVA PLUS) test strip Test blood sugar three times a day.  DX E11.9 200 each 6  . insulin detemir (LEVEMIR) 100 UNIT/ML injection Inject 0.31 mLs (31 Units total) into the skin daily. 10 mL 3  . Insulin Pen  Needle 31G X 8 MM MISC To use with insulin 100 each 3  . levocetirizine (XYZAL)  5 MG tablet Take 1 tablet (5 mg total) by mouth every evening. 30 tablet 2  . losartan (COZAAR) 25 MG tablet Take 12.5 mg by mouth daily.     Marland Kitchen lovastatin (MEVACOR) 40 MG tablet Take 40 mg by mouth at bedtime.    . Melatonin 5 MG TABS Take 5 mg by mouth at bedtime as needed (for sleep).     . metoprolol succinate (TOPROL-XL) 25 MG 24 hr tablet Take 25 mg by mouth 2 (two) times daily.     . Multiple Vitamin (MULTIVITAMIN) tablet Take 1 tablet by mouth daily.    . tamsulosin (FLOMAX) 0.4 MG CAPS capsule Take 1 capsule (0.4 mg total) by mouth daily. 90 capsule 3  . traZODone (DESYREL) 50 MG tablet Take 0.5-1 tablets (25-50 mg total) by mouth at bedtime as needed for sleep. 30 tablet 3   No current facility-administered medications for this visit.     REVIEW OF SYSTEMS:    A 10+ POINT REVIEW OF SYSTEMS WAS OBTAINED including neurology, dermatology, psychiatry, cardiac, respiratory, lymph, extremities, GI, GU, Musculoskeletal, constitutional, breasts, reproductive, HEENT.  All pertinent positives are noted in the HPI.  All others are negative.   PHYSICAL EXAMINATION: ECOG PERFORMANCE STATUS: 2 - Symptomatic, <50% confined to bed  Vitals:   08/19/18 1028  BP: 140/84  Pulse: 73  Resp: 17  Temp: 98.5 F (36.9 C)  SpO2: 98%   Filed Weights   08/19/18 1028  Weight: 150 lb 6.4 oz (68.2 kg)   .Body mass index is 25.82 kg/m.  GENERAL:alert, in no acute distress and comfortable SKIN: no acute rashes, no significant lesions EYES: conjunctiva are pink and non-injected, sclera anicteric OROPHARYNX: MMM, no exudates, no oropharyngeal erythema or ulceration NECK: supple, no JVD LYMPH:  no palpable lymphadenopathy in the cervical, axillary or inguinal regions LUNGS: clear to auscultation b/l with normal respiratory effort HEART: regular rate & rhythm, 3/6 SM aorticarea ABDOMEN:  normoactive bowel sounds , non  tender, not distended. No palpable hepatosplenomegaly.  Extremity: no pedal edema PSYCH: alert & oriented x 3 with fluent speech NEURO: no focal motor/sensory deficits   LABORATORY DATA:  I have reviewed the data as listed  . CBC Latest Ref Rng & Units 08/19/2018 03/27/2018 02/06/2018  WBC 4.0 - 10.5 K/uL 7.0 9.0 8.2  Hemoglobin 13.0 - 17.0 g/dL 13.4 12.3(L) 11.1(L)  Hematocrit 39.0 - 52.0 % 41.7 39.1 34.4(L)  Platelets 150 - 400 K/uL 151 204 149(L)   . CBC    Component Value Date/Time   WBC 7.0 08/19/2018 1005   RBC 4.53 08/19/2018 1005   HGB 13.4 08/19/2018 1005   HGB 13.1 12/17/2017 1306   HCT 41.7 08/19/2018 1005   HCT 39.4 12/17/2017 1306   PLT 151 08/19/2018 1005   PLT 206 12/17/2017 1306   MCV 92.1 08/19/2018 1005   MCV 88 12/17/2017 1306   MCH 29.6 08/19/2018 1005   MCHC 32.1 08/19/2018 1005   RDW 14.1 08/19/2018 1005   RDW 15.0 12/17/2017 1306   LYMPHSABS 1.9 08/19/2018 1005   MONOABS 0.5 08/19/2018 1005   EOSABS 0.1 08/19/2018 1005   BASOSABS 0.0 08/19/2018 1005    . CMP Latest Ref Rng & Units 08/19/2018 03/27/2018 03/11/2018  Glucose 70 - 99 mg/dL 305(H) 369(H) 153(H)  BUN 8 - 23 mg/dL 48(H) 37(H) 32(H)  Creatinine 0.61 - 1.24 mg/dL 1.77(H) 1.63(H) 1.47(H)  Sodium 135 - 145 mmol/L 139 138 140  Potassium 3.5 - 5.1 mmol/L 5.2(H) 5.0 4.6  Chloride 98 - 111 mmol/L 103 104 100  CO2 22 - 32 mmol/L 27 24 24   Calcium 8.9 - 10.3 mg/dL 9.3 9.1 9.3  Total Protein 6.5 - 8.1 g/dL 6.8 6.5 -  Total Bilirubin 0.3 - 1.2 mg/dL 0.6 0.5 -  Alkaline Phos 38 - 126 U/L 107 104 -  AST 15 - 41 U/L 35 24 -  ALT 0 - 44 U/L 36 23 -     RADIOGRAPHIC STUDIES: I have personally reviewed the radiological images as listed and agreed with the findings in the report. No results found.   08/04/17 CT Chest:    ASSESSMENT & PLAN:  85 y.o. male with  1. History of CLL 03/2013 treated with Bendamustine and possibly Rituxan (outside records incomplete) but discontinued after 1 dose due  to acute kidney injury 04/2013 to 08/2013 treated with 5 cycles of Gazyva and Chlorambucil  04/2015 FISH was without chromosomal abnormalities   2. History of Lung Cancer Invasive adenocarcinoma of the right upper lung in 2015, moderately differentiated, initially Stage IB with visceral pleural invasion but resected with clear margins and no lymph node involvement 04/2016 Biopsy confirmed right upper lobe recurrence of non-small cell cancer. Treated with stereotactic body radiotherapy to right upper lobe , total dose of 5000cGy in 5 fractions  01/2017 New right lower lobe nodule initially concerning for recurrence, but then resolved spontaneously  03/22/18 CT Chest revealed "The partially calcified right perihilar mass is stable from available priors dating back to 01/09/2018. Based on the stability and lack of hypermetabolic activity on PET-CT, this is likely postinflammatory or treated tumor. Continued CT follow-up (3-6 months) recommended. 2. New patchy left lower lobe airspace disease, likely inflammatory and potentially early bronchopneumonia. Radiographic follow up recommended. 3. Stable right upper lobe solid and ground-glass opacities can be addressed on subsequent follow-up. 4. Coronary and Aortic Atherosclerosis. Previous T AVR."  3. Prostate cancer low grade Has maintained surveillance with urology and every 6 month PSA checks -on lupron with urology  4. History of high grade bladder cancer Continue follow up with urology  5. History of Squamous cell carcinoma x3 and Basal cell carcinoma -continue f/u with dermatology  6. CKD Stage III - stable . Continue f/u with PCP  7. H/o CAD, CHF, with mod-severe Aortic Stenosis.modMR -continue f/u with cardiology for continued Mx.   8. Bronchopneumonia- seen on 03/22/18 CT Chest, with clinical correlation on 03/27/18  PLAN: -Discussed pt labwork today, 08/19/18; blood counts are normal, chemistries are stable. LDH stable at 291 -Discussed the  06/18/18 CT Chest which revealed "Stable RIGHT suprahilar mass most consists with post the radiation / surgical therapy change. 2. Waxing waning airspace disease/nodularity in the LEFT lower lobe with increased nodularity. Findings atypical for carcinoma. Consider aspiration pneumonia. Recommend follow-up CT exam in 1-3 months. 3. Stable ground-glass nodules in the upper lobe are indeterminate. Recommend attention on routine oncology surveillance." -Advised sitting up straight while eating, and tucking chin to prevent choking. Pt currently denies any problems or difficulty with swallowing. -Will repeat CT chest imaging in 22 weeks -Follow up with PCP for appropriate DM management and blood sugar control -The pt shows no clinical or lab progression of his CLL at this time.  -No indication for further treatment of lung CA at this time.  -Continue follow up with urologist Dr. Louis Meckel for prostate and bladder cancer management  -Continue follow up with Dr. [name] in cardiology -Recommended that the pt continue to eat well, drink at  least 48-64 oz of water each day, and walk 20-30 minutes each day. -Will see the pt back in 24 weeks   Orders Placed This Encounter  Procedures  . CT Chest Wo Contrast    Standing Status:   Future    Standing Expiration Date:   08/19/2019    Order Specific Question:   ** REASON FOR EXAM (FREE TEXT)    Answer:   f/u for recurrent non small cell lung cancer    Order Specific Question:   Preferred imaging location?    Answer:   [facility]    Order Specific Question:   Radiology Contrast Protocol - do NOT remove file path    Answer:   [email or web address]  . CBC with Differential/Platelet    Standing Status:   Future    Standing Expiration Date:   09/23/2019  . CMP ([facility] only)    Standing Status:   Future    Standing Expiration Date:   08/19/2019  . Lactate dehydrogenase    Standing Status:   Future    Standing  Expiration Date:   08/19/2019    CT chest in 22 weeks RTC with Dr Irene Limbo with labs in 24 weeks   All of the patients questions were answered with apparent satisfaction. The patient knows to call the clinic with any problems, questions or concerns.  The total time spent in the appt was 25 minutes and more than 50% was on counseling and direct patient cares.    Sullivan Lone MD MS AAHIVMS [facility] Hematology/Oncology Physician [facility]  (Office):       [phone] (Work cell):  [phone] (Fax):           [phone]  08/19/2018 11:40 AM  I, Baldwin [place], am acting as a scribe for Dr. Sullivan Lone.   .I have reviewed the above documentation for accuracy and completeness, and I agree with the above. Brunetta Genera MD

## 2018-08-19 ENCOUNTER — Other Ambulatory Visit: Payer: Self-pay

## 2018-08-19 ENCOUNTER — Inpatient Hospital Stay: Payer: Medicare Other | Attending: Hematology | Admitting: Hematology

## 2018-08-19 ENCOUNTER — Inpatient Hospital Stay: Payer: Medicare Other

## 2018-08-19 VITALS — BP 140/84 | HR 73 | Temp 98.5°F | Resp 17 | Ht 64.0 in | Wt 150.4 lb

## 2018-08-19 DIAGNOSIS — Z794 Long term (current) use of insulin: Secondary | ICD-10-CM | POA: Insufficient documentation

## 2018-08-19 DIAGNOSIS — Z85828 Personal history of other malignant neoplasm of skin: Secondary | ICD-10-CM | POA: Insufficient documentation

## 2018-08-19 DIAGNOSIS — C9110 Chronic lymphocytic leukemia of B-cell type not having achieved remission: Secondary | ICD-10-CM | POA: Insufficient documentation

## 2018-08-19 DIAGNOSIS — I252 Old myocardial infarction: Secondary | ICD-10-CM | POA: Insufficient documentation

## 2018-08-19 DIAGNOSIS — C61 Malignant neoplasm of prostate: Secondary | ICD-10-CM | POA: Diagnosis not present

## 2018-08-19 DIAGNOSIS — I509 Heart failure, unspecified: Secondary | ICD-10-CM | POA: Diagnosis not present

## 2018-08-19 DIAGNOSIS — Z7982 Long term (current) use of aspirin: Secondary | ICD-10-CM | POA: Diagnosis not present

## 2018-08-19 DIAGNOSIS — Z87891 Personal history of nicotine dependence: Secondary | ICD-10-CM | POA: Insufficient documentation

## 2018-08-19 DIAGNOSIS — E1122 Type 2 diabetes mellitus with diabetic chronic kidney disease: Secondary | ICD-10-CM

## 2018-08-19 DIAGNOSIS — Z8551 Personal history of malignant neoplasm of bladder: Secondary | ICD-10-CM

## 2018-08-19 DIAGNOSIS — Z79899 Other long term (current) drug therapy: Secondary | ICD-10-CM

## 2018-08-19 DIAGNOSIS — Z923 Personal history of irradiation: Secondary | ICD-10-CM | POA: Insufficient documentation

## 2018-08-19 DIAGNOSIS — N183 Chronic kidney disease, stage 3 (moderate): Secondary | ICD-10-CM | POA: Diagnosis not present

## 2018-08-19 DIAGNOSIS — I130 Hypertensive heart and chronic kidney disease with heart failure and stage 1 through stage 4 chronic kidney disease, or unspecified chronic kidney disease: Secondary | ICD-10-CM | POA: Diagnosis not present

## 2018-08-19 DIAGNOSIS — Z951 Presence of aortocoronary bypass graft: Secondary | ICD-10-CM | POA: Insufficient documentation

## 2018-08-19 LAB — CMP (CANCER CENTER ONLY)
ALT: 36 U/L (ref 0–44)
AST: 35 U/L (ref 15–41)
Albumin: 3.7 g/dL (ref 3.5–5.0)
Alkaline Phosphatase: 107 U/L (ref 38–126)
Anion gap: 9 (ref 5–15)
BUN: 48 mg/dL — ABNORMAL HIGH (ref 8–23)
CO2: 27 mmol/L (ref 22–32)
Calcium: 9.3 mg/dL (ref 8.9–10.3)
Chloride: 103 mmol/L (ref 98–111)
Creatinine: 1.77 mg/dL — ABNORMAL HIGH (ref 0.61–1.24)
GFR, Est AFR Am: 39 mL/min — ABNORMAL LOW (ref >60)
GFR, Estimated: 34 mL/min — ABNORMAL LOW (ref >60)
Glucose, Bld: 305 mg/dL — ABNORMAL HIGH (ref 70–99)
Potassium: 5.2 mmol/L — ABNORMAL HIGH (ref 3.5–5.1)
Sodium: 139 mmol/L (ref 135–145)
Total Bilirubin: 0.6 mg/dL (ref 0.3–1.2)
Total Protein: 6.8 g/dL (ref 6.5–8.1)

## 2018-08-19 LAB — CBC WITH DIFFERENTIAL/PLATELET
Abs Immature Granulocytes: 0.04 10*3/uL (ref 0.00–0.07)
Basophils Absolute: 0 10*3/uL (ref 0.0–0.1)
Basophils Relative: 0 %
Eosinophils Absolute: 0.1 10*3/uL (ref 0.0–0.5)
Eosinophils Relative: 1 %
HCT: 41.7 % (ref 39.0–52.0)
Hemoglobin: 13.4 g/dL (ref 13.0–17.0)
Immature Granulocytes: 1 %
Lymphocytes Relative: 27 %
Lymphs Abs: 1.9 10*3/uL (ref 0.7–4.0)
MCH: 29.6 pg (ref 26.0–34.0)
MCHC: 32.1 g/dL (ref 30.0–36.0)
MCV: 92.1 fL (ref 80.0–100.0)
Monocytes Absolute: 0.5 10*3/uL (ref 0.1–1.0)
Monocytes Relative: 8 %
Neutro Abs: 4.4 10*3/uL (ref 1.7–7.7)
Neutrophils Relative %: 63 %
Platelets: 151 10*3/uL (ref 150–400)
RBC: 4.53 MIL/uL (ref 4.22–5.81)
RDW: 14.1 % (ref 11.5–15.5)
WBC: 7 10*3/uL (ref 4.0–10.5)
nRBC: 0 % (ref 0.0–0.2)

## 2018-08-19 LAB — LACTATE DEHYDROGENASE: LDH: 291 U/L — ABNORMAL HIGH (ref 98–192)

## 2018-08-20 ENCOUNTER — Telehealth: Payer: Self-pay | Admitting: Hematology

## 2018-08-20 DIAGNOSIS — E1159 Type 2 diabetes mellitus with other circulatory complications: Secondary | ICD-10-CM | POA: Diagnosis not present

## 2018-08-20 DIAGNOSIS — L84 Corns and callosities: Secondary | ICD-10-CM | POA: Diagnosis not present

## 2018-08-20 DIAGNOSIS — L602 Onychogryphosis: Secondary | ICD-10-CM | POA: Diagnosis not present

## 2018-08-20 NOTE — Telephone Encounter (Signed)
Scheduled appt per 6/1 los. ° °A calendar will be mailed out. °

## 2018-08-20 NOTE — Addendum Note (Signed)
Encounter addended by: Noel Christmas, RN on: 08/20/2018 11:41 AM  Actions taken: Flowsheet data copied forward, Visit Navigator Flowsheet section accepted, Clinical Note Signed

## 2018-08-20 NOTE — Progress Notes (Addendum)
Discharge Progress Report  Patient Details  Name: Austin Lloyd MRN: [id number] Date of Birth: 1929/12/23 Referring Provider:     [facility] from 04/18/2018 in [facility]  Referring Provider  [name] MD       Number of Visits: 17  Reason for Discharge:  Early Exit:  Insurance   Smoking History:  Social History   Tobacco Use  Smoking Status Former Smoker  . Packs/day: 0.20  . Years: 30.00  . Pack years: 6.00  . Types: Cigarettes  Smokeless Tobacco Never Used    Diagnosis:  S/P TAVR (transcatheter aortic valve replacement)  ADL [facility]:   Initial Exercise Prescription: Initial Exercise Prescription - 04/18/18 1400      Date of Initial Exercise RX and Referring Provider   Date  04/18/18    Referring Provider  [name] MD    Expected Discharge Date  07/26/18      NuStep   Level  1    SPM  75    Minutes  10    METs  1.5      Arm Ergometer   Level  1    Watts  5    Minutes  10    METs  1.37      Track   Laps  6    Minutes  10    METs  2      Prescription Details   Frequency (times per week)  3x    Duration  Progress to 30 minutes of continuous aerobic without signs/symptoms of physical distress      Intensity   THRR 40-80% of Max Heartrate  54-107    Ratings of Perceived Exertion  11-13    Perceived Dyspnea  0-4      Progression   Progression  Continue progressive overload as per policy without signs/symptoms or physical distress.      Resistance Training   Training Prescription  Yes    Weight  2lbs    Reps  10-15       Discharge Exercise Prescription (Final Exercise Prescription Changes): Exercise Prescription Changes - 05/31/18 1500      Response to Exercise   Blood Pressure (Admit)  130/80    Blood Pressure (Exercise)  158/84    Blood Pressure (Exit)  110/66    Heart Rate (Admit)  79 bpm    Heart Rate (Exercise)  115 bpm    Heart Rate (Exit)  86 bpm    Rating  of Perceived Exertion (Exercise)  11    Perceived Dyspnea (Exercise)  0    Symptoms  None    Comments  None    Duration  Progress to 30 minutes of  aerobic without signs/symptoms of physical distress    Intensity  THRR unchanged      Progression   Progression  Continue to progress workloads to maintain intensity without signs/symptoms of physical distress.    Average METs  2.3      Resistance Training   Training Prescription  Yes    Weight  2lbs    Reps  10-15    Time  10 Minutes      NuStep   Level  2    SPM  85    Minutes  10    METs  2.2      Arm Ergometer   Level  1    Watts  5    Minutes  10  METs  1.9      Track   Laps  10    Minutes  10    METs  2.76      Home Exercise Plan   Plans to continue exercise at  Home (comment)   Walking   Frequency  Add 3 additional days to program exercise sessions.    Initial Home Exercises Provided  05/15/18       Functional Capacity: 6 Minute Walk    Row Name 04/18/18 1512         6 Minute Walk   Phase  Initial     Distance  897 feet     Walk Time  6 minutes     # of Rest Breaks  0     MPH  1.7     METS  1.3     RPE  12     Perceived Dyspnea   0     VO2 Peak  4.54     Symptoms  No     Resting HR  74 bpm     Resting BP  128/60     Resting Oxygen Saturation   97 %     Exercise Oxygen Saturation  during 6 min walk  95 %     Max Ex. HR  98 bpm     Max Ex. BP  126/74     2 Minute Post BP  122/70        Psychological, QOL, Others - Outcomes: PHQ 2/9: Depression screen PHQ 2/9 04/24/2018  Decreased Interest 0  Down, Depressed, Hopeless 0  PHQ - 2 Score 0    Quality of Life: Quality of Life - 04/18/18 1348      Quality of Life   Select  Quality of Life      Quality of Life Scores   Health/Function Pre  19.39 %    Socioeconomic Pre  25.83 %    Psych/Spiritual Pre  28.93 %    Family Pre  27.6 %    GLOBAL Pre  23.97 %       Personal Goals: Goals established at orientation with interventions  provided to work toward goal. Personal Goals and Risk Factors at Admission - 04/18/18 1514      Core Components/Risk Factors/Patient Goals on Admission    Weight Management  Yes;Weight Loss    Intervention  Weight Management: Develop a combined nutrition and exercise program designed to reach desired caloric intake, while maintaining appropriate intake of nutrient and fiber, sodium and fats, and appropriate energy expenditure required for the weight goal.;Weight Management: Provide education and appropriate resources to help participant work on and attain dietary goals.;Weight Management/Obesity: Establish reasonable short term and long term weight goals.    Admit Weight  149 lb 0.5 oz (67.6 kg)    Goal Weight: Short Term  145 lb (65.8 kg)    Goal Weight: Long Term  140 lb (63.5 kg)    Expected Outcomes  Short Term: Continue to assess and modify interventions until short term weight is achieved;Long Term: Adherence to nutrition and physical activity/exercise program aimed toward attainment of established weight goal;Weight Maintenance: Understanding of the daily nutrition guidelines, which includes 25-35% calories from fat, 7% or less cal from saturated fats, less than 200mg  cholesterol, less than 1.5gm of sodium, & 5 or more servings of fruits and vegetables daily;Weight Loss: Understanding of general recommendations for a balanced deficit meal plan, which promotes 1-2 lb weight loss per week and includes  a negative energy balance of [phone] kcal/d;Understanding recommendations for meals to include 15-35% energy as protein, 25-35% energy from fat, 35-60% energy from carbohydrates, less than 200mg  of dietary cholesterol, 20-35 gm of total fiber daily;Understanding of distribution of calorie intake throughout the day with the consumption of 4-5 meals/snacks    Diabetes  Yes    Intervention  Provide education about signs/symptoms and action to take for hypo/hyperglycemia.;Provide education about proper  nutrition, including hydration, and aerobic/resistive exercise prescription along with prescribed medications to achieve blood glucose in normal ranges: Fasting glucose 65-99 mg/dL    Expected Outcomes  Short Term: Participant verbalizes understanding of the signs/symptoms and immediate care of hyper/hypoglycemia, proper foot care and importance of medication, aerobic/resistive exercise and nutrition plan for blood glucose control.;Long Term: Attainment of HbA1C < 7%.    Heart Failure  Yes    Intervention  Provide a combined exercise and nutrition program that is supplemented with education, support and counseling about heart failure. Directed toward relieving symptoms such as shortness of breath, decreased exercise tolerance, and extremity edema.    Expected Outcomes  Improve functional capacity of life;Short term: Attendance in program 2-3 days a week with increased exercise capacity. Reported lower sodium intake. Reported increased fruit and vegetable intake. Reports medication compliance.;Short term: Daily weights obtained and reported for increase. Utilizing diuretic protocols set by physician.;Long term: Adoption of self-care skills and reduction of barriers for early signs and symptoms recognition and intervention leading to self-care maintenance.    Hypertension  Yes    Intervention  Provide education on lifestyle modifcations including regular physical activity/exercise, weight management, moderate sodium restriction and increased consumption of fresh fruit, vegetables, and low fat dairy, alcohol moderation, and smoking cessation.;Monitor prescription use compliance.    Expected Outcomes  Short Term: Continued assessment and intervention until BP is < 140/68mm HG in hypertensive participants. < 130/60mm HG in hypertensive participants with diabetes, heart failure or chronic kidney disease.;Long Term: Maintenance of blood pressure at goal levels.    Lipids  Yes    Intervention  Provide education and  support for participant on nutrition & aerobic/resistive exercise along with prescribed medications to achieve LDL 70mg , HDL >40mg .    Expected Outcomes  Long Term: Cholesterol controlled with medications as prescribed, with individualized exercise RX and with personalized nutrition plan. Value goals: LDL < 70mg , HDL > 40 mg.;Short Term: Participant states understanding of desired cholesterol values and is compliant with medications prescribed. Participant is following exercise prescription and nutrition guidelines.        Personal Goals Discharge: Goals and Risk Factor Review    Row Name 04/24/18 1534 05/23/18 1417 06/06/18 1615 08/20/18 1129       Core Components/Risk Factors/Patient Goals Review   Personal Goals Review  Weight Management/Obesity;Lipids;Heart Failure;Diabetes;Hypertension  Weight Management/Obesity;Lipids;Heart Failure;Diabetes;Hypertension  Weight Management/Obesity;Lipids;Heart Failure;Diabetes;Hypertension  -    Review  Pt with multiple CAD RFs willing to participate in CR exercise.  Josph Macho would like to increase his walking speed.  Pt with multiple CAD RFs willing to participate in CR exercise.  Josph Macho is tolerating exercise well.  VSS.  He plans to only complete 26 sessions.   Pt continues to tolerate exercise well.  Exercise currently on hold as department is closed per recommended guidelines from federal government to prevent spread of COVID-19.   Pt discharged early from the program after 17 sessions. Josph Macho feels that he will be able to walk at home and does not wish to return when CR opens after being  closed for COVID-19.    Expected Outcomes  Pt will continue to participate in CR exercise, nutrition, and lifestyle modification opportunities.   Pt will continue to participate in CR exercise, nutrition, and lifestyle modification opportunities.   Pt will continue to participate in exercise, nutrition, and lifestyle modification opportunities.   Pt will continue to participate in  exercise, nutrition, and lifestyle modification opportunities.  Fred plans to continue walking.        Exercise Goals and Review: Exercise Goals    Row Name 04/18/18 1338             Exercise Goals   Increase Physical Activity  Yes       Intervention  Provide advice, education, support and counseling about physical activity/exercise needs.;Develop an individualized exercise prescription for aerobic and resistive training based on initial evaluation findings, risk stratification, comorbidities and participant's personal goals.       Expected Outcomes  Long Term: Exercising regularly at least 3-5 days a week.;Short Term: Attend rehab on a regular basis to increase amount of physical activity.;Long Term: Add in home exercise to make exercise part of routine and to increase amount of physical activity.       Increase Strength and Stamina  Yes       Intervention  Provide advice, education, support and counseling about physical activity/exercise needs.;Develop an individualized exercise prescription for aerobic and resistive training based on initial evaluation findings, risk stratification, comorbidities and participant's personal goals.       Expected Outcomes  Short Term: Increase workloads from initial exercise prescription for resistance, speed, and METs.;Short Term: Perform resistance training exercises routinely during rehab and add in resistance training at home;Long Term: Improve cardiorespiratory fitness, muscular endurance and strength as measured by increased METs and functional capacity (6MWT)       Able to understand and use rate of perceived exertion (RPE) scale  Yes       Intervention  Provide education and explanation on how to use RPE scale       Expected Outcomes  Short Term: Able to use RPE daily in rehab to express subjective intensity level;Long Term:  Able to use RPE to guide intensity level when exercising independently       Knowledge and understanding of Target Heart Rate  Range (THRR)  Yes       Intervention  Provide education and explanation of THRR including how the numbers were predicted and where they are located for reference       Expected Outcomes  Short Term: Able to state/look up THRR;Long Term: Able to use THRR to govern intensity when exercising independently;Short Term: Able to use daily as guideline for intensity in rehab       Able to check pulse independently  Yes       Intervention  Provide education and demonstration on how to check pulse in carotid and radial arteries.;Review the importance of being able to check your own pulse for safety during independent exercise       Expected Outcomes  Short Term: Able to explain why pulse checking is important during independent exercise;Long Term: Able to check pulse independently and accurately       Understanding of Exercise Prescription  Yes       Intervention  Provide education, explanation, and written materials on patient's individual exercise prescription       Expected Outcomes  Short Term: Able to explain program exercise prescription;Long Term: Able to explain home exercise prescription to exercise  independently          Exercise Goals Re-Evaluation: Exercise Goals Re-Evaluation    [place] Name 05/16/18 1616 05/16/18 1619           Exercise Goal Re-Evaluation   Exercise Goals Review  Increase Physical Activity;Able to understand and use rate of perceived exertion (RPE) scale;Knowledge and understanding of Target Heart Rate Range (THRR);Understanding of Exercise Prescription;Increase Strength and Stamina;Able to check pulse independently  -      Comments  Reviewed HEP with pt. Also reviewed THRR Range, RPE Scale, endpoints of exercise, NTG use, warmup and cool down.   Reviewed HEP with pt. Also reviewed THRR Range, RPE Scale, endpoints of exercise, NTG use, warmup and cool down.       Expected Outcomes  Pt will continue to walk 5-6 days a week for 45 minutes. Pt will continue to increase  cardiovascular strength. Will continue to monitor and progress pt as tolerated.   -         Nutrition & Weight - Outcomes: Pre Biometrics - 04/18/18 1513      Pre Biometrics   Height  5' 3.75" (1.619 m)    Weight  67.6 kg    Waist Circumference  40 inches    Hip Circumference  39.5 inches    Waist to Hip Ratio  1.01 %    BMI (Calculated)  25.79    Triceps Skinfold  32 mm    % Body Fat  31.1 %    Grip Strength  27 kg    Flexibility  7 in    Single Leg Stand  2.56 seconds        Nutrition: Nutrition Therapy & Goals - 05/01/18 1212      Nutrition Therapy   Diet  carb modified      Personal Nutrition Goals   Nutrition Goal  Pt to identify and limit food sources of saturated fat, trans fat, refined carbohydrates and sodium    Personal Goal #2  Pt to build a healthy plate including vegetables, fruits, whole grains, and low-fat dairy products in a heart healthy meal plan    Personal Goal #3  Pt able to name foods that affect blood glucose    Personal Goal #4  Pt to add in a snack before exercise, and add a protein to breakfast      Intervention Plan   Intervention  Prescribe, educate and counsel regarding individualized specific dietary modifications aiming towards targeted core components such as weight, hypertension, lipid management, diabetes, heart failure and other comorbidities.    Expected Outcomes  Short Term Goal: Understand basic principles of dietary content, such as calories, fat, sodium, cholesterol and nutrients.;Long Term Goal: Adherence to prescribed nutrition plan.       Nutrition Discharge: Nutrition Assessments - 04/19/18 1045      MEDFICTS Scores   Pre Score  21       Education Questionnaire Score: Knowledge Questionnaire Score - 04/18/18 1347      Knowledge Questionnaire Score   Pre Score  20/24       Goals reviewed with patient; copy given to patient.

## 2018-08-28 ENCOUNTER — Ambulatory Visit (INDEPENDENT_AMBULATORY_CARE_PROVIDER_SITE_OTHER): Payer: Medicare Other | Admitting: Family Medicine

## 2018-08-28 ENCOUNTER — Other Ambulatory Visit: Payer: Self-pay

## 2018-08-28 ENCOUNTER — Encounter: Payer: Self-pay | Admitting: Family Medicine

## 2018-08-28 VITALS — BP 120/78 | HR 70 | Temp 97.7°F | Ht 64.0 in | Wt 153.0 lb

## 2018-08-28 DIAGNOSIS — R059 Cough, unspecified: Secondary | ICD-10-CM

## 2018-08-28 DIAGNOSIS — Z794 Long term (current) use of insulin: Secondary | ICD-10-CM

## 2018-08-28 DIAGNOSIS — I10 Essential (primary) hypertension: Secondary | ICD-10-CM

## 2018-08-28 DIAGNOSIS — E785 Hyperlipidemia, unspecified: Secondary | ICD-10-CM | POA: Diagnosis not present

## 2018-08-28 DIAGNOSIS — R05 Cough: Secondary | ICD-10-CM

## 2018-08-28 DIAGNOSIS — E119 Type 2 diabetes mellitus without complications: Secondary | ICD-10-CM

## 2018-08-28 LAB — LIPID PANEL
Cholesterol: 187 mg/dL (ref 0–200)
HDL: 54 mg/dL (ref >39.00)
LDL Cholesterol: 109 mg/dL — ABNORMAL HIGH (ref 0–99)
NonHDL: 132.85
Total CHOL/HDL Ratio: 3
Triglycerides: 121 mg/dL (ref 0.0–149.0)
VLDL: 24.2 mg/dL (ref 0.0–40.0)

## 2018-08-28 LAB — COMPREHENSIVE METABOLIC PANEL
ALT: 26 U/L (ref 0–53)
AST: 29 U/L (ref 0–37)
Albumin: 4 g/dL (ref 3.5–5.2)
Alkaline Phosphatase: 89 U/L (ref 39–117)
BUN: 51 mg/dL — ABNORMAL HIGH (ref 6–23)
CO2: 31 mEq/L (ref 19–32)
Calcium: 9.2 mg/dL (ref 8.4–10.5)
Chloride: 103 mEq/L (ref 96–112)
Creatinine, Ser: 1.59 mg/dL — ABNORMAL HIGH (ref 0.40–1.50)
GFR: 41.43 mL/min — ABNORMAL LOW (ref >60.00)
Glucose, Bld: 70 mg/dL (ref 70–99)
Potassium: 4.9 mEq/L (ref 3.5–5.1)
Sodium: 139 mEq/L (ref 135–145)
Total Bilirubin: 0.6 mg/dL (ref 0.2–1.2)
Total Protein: 5.9 g/dL — ABNORMAL LOW (ref 6.0–8.3)

## 2018-08-28 LAB — HEMOGLOBIN A1C: Hgb A1c MFr Bld: 8.1 % — ABNORMAL HIGH (ref 4.6–6.5)

## 2018-08-28 MED ORDER — LEVOCETIRIZINE DIHYDROCHLORIDE 5 MG PO TABS: 5.0000 mg | ORAL_TABLET | Freq: Every evening | ORAL | 2 refills | Status: DC

## 2018-08-28 NOTE — Progress Notes (Signed)
Subjective:   Chief Complaint  Patient presents with  . Follow-up    Austin Lloyd is a 85 y.o. male here for follow-up of diabetes.   Austin Lloyd self monitored glucose range is 100-200's.   Patient denies hypoglycemic reactions. He checks his glucose levels 1 times per day. Patient does require insulin.   Medications include: insulin Diet is fair, eats whatever he wants. Exercise: some walking  Hypertension Patient presents for hypertension follow up. He does not monitor home blood pressures. He is compliant with medications. Patient has these side effects of medication: none He is usually adhering to a healthy diet overall. Exercise: walking  Hyperlipidemia Patient presents for dyslipidemia follow up. Currently being treated with Mevacor and compliance with treatment thus far has been good. He denies myalgias. He is adhering to a healthy diet. Exercise: walking The patient is known to have coexisting coronary artery disease.  Past Medical History:  Diagnosis Date  . Aortic stenosis   . Bladder cancer ([place])    T1 status post BCG  . 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 Coronary artery disease with history of myocardial infarction without history of CABG    LIMA to the LAD, SVG to RCA 2016 for treatment of the percent left main stenosis and 80% RCA stenosis.    . Depression   . Diabetes mellitus, type II ([place])   . Hypertension   . Ischemic cardiomyopathy    EF 40% 2019  . Lung cancer ([place])    Adenocarcinoma of the right lung moderately differentiated  . Lung mass   . PVC's (premature ventricular contractions)   . S/P TAVR (transcatheter aortic valve replacement)      Related testing: Date of retinal exam: Done Pneumovax: done Flu Shot: done  Review of Systems: Pulmonary:  No SOB Cardiovascular:  No chest pain  Objective:  BP 120/78 (BP Location: Left Arm, Patient Position:  Sitting, Cuff Size: Normal)   Pulse 70   Temp 97.7 F (36.5 C) (Oral)   Ht 5\' 4"  (1.626 m)   Wt 153 lb (69.4 kg)   SpO2 96%   BMI 26.26 kg/m  General:  Well developed, well nourished, in no apparent distress Skin:  Warm, no pallor or diaphoresis Head:  Normocephalic, atraumatic Eyes:  Pupils equal and round, sclera anicteric without injection  Lungs:  CTAB, no access msc use Cardio:  RRR, +SEM, no LE edema Musculoskeletal:  Symmetrical muscle groups noted without atrophy or deformity Psych: Age appropriate judgment and insight  Assessment:   Type 2 diabetes mellitus without complication, with long-term current use of insulin (HCC) - Plan: Lipid panel, Hemoglobin A1c, Comprehensive metabolic panel  Cough - Plan: levocetirizine (XYZAL) 5 MG tablet  Essential hypertension  Hyperlipidemia, unspecified hyperlipidemia type   Plan:   Orders as above. Counseled on diet and exercise. Cont meds, await A1c.  F/u in 6 mo. The patient voiced understanding and agreement to the plan.  [place], DO 08/28/18 11:54 AM

## 2018-08-28 NOTE — Patient Instructions (Signed)
Give us 2-3 business days to get the results of your labs back.   Keep the diet clean and stay active.  Let us know if you need anything. 

## 2018-09-06 DIAGNOSIS — C4441 Basal cell carcinoma of skin of scalp and neck: Secondary | ICD-10-CM | POA: Diagnosis not present

## 2018-09-06 DIAGNOSIS — Z85828 Personal history of other malignant neoplasm of skin: Secondary | ICD-10-CM | POA: Diagnosis not present

## 2018-09-06 DIAGNOSIS — L821 Other seborrheic keratosis: Secondary | ICD-10-CM | POA: Diagnosis not present

## 2018-09-06 DIAGNOSIS — L570 Actinic keratosis: Secondary | ICD-10-CM | POA: Diagnosis not present

## 2018-09-06 DIAGNOSIS — D1801 Hemangioma of skin and subcutaneous tissue: Secondary | ICD-10-CM | POA: Diagnosis not present

## 2018-09-16 DIAGNOSIS — Z8551 Personal history of malignant neoplasm of bladder: Secondary | ICD-10-CM | POA: Diagnosis not present

## 2018-09-16 DIAGNOSIS — C61 Malignant neoplasm of prostate: Secondary | ICD-10-CM | POA: Diagnosis not present

## 2018-09-24 ENCOUNTER — Other Ambulatory Visit: Payer: Self-pay | Admitting: Family Medicine

## 2018-10-25 DIAGNOSIS — L84 Corns and callosities: Secondary | ICD-10-CM | POA: Diagnosis not present

## 2018-10-25 DIAGNOSIS — L602 Onychogryphosis: Secondary | ICD-10-CM | POA: Diagnosis not present

## 2018-10-25 DIAGNOSIS — E1159 Type 2 diabetes mellitus with other circulatory complications: Secondary | ICD-10-CM | POA: Diagnosis not present

## 2018-11-22 ENCOUNTER — Encounter: Payer: Self-pay | Admitting: Family Medicine

## 2018-11-22 DIAGNOSIS — Z23 Encounter for immunization: Secondary | ICD-10-CM | POA: Diagnosis not present

## 2018-11-26 ENCOUNTER — Other Ambulatory Visit: Payer: Self-pay | Admitting: Family Medicine

## 2018-11-26 MED ORDER — LOVASTATIN 40 MG PO TABS: 40.0000 mg | ORAL_TABLET | Freq: Every day | ORAL | 0 refills | Status: DC

## 2018-11-26 MED ORDER — LOSARTAN POTASSIUM 25 MG PO TABS: 12.5000 mg | ORAL_TABLET | Freq: Every day | ORAL | 0 refills | Status: DC

## 2018-11-26 MED ORDER — METOPROLOL SUCCINATE ER 25 MG PO TB24: 25.0000 mg | ORAL_TABLET | Freq: Two times a day (BID) | ORAL | 0 refills | Status: DC

## 2018-11-26 NOTE — Telephone Encounter (Signed)
Medication Refill - Medication: losartan (COZAAR) 25 MG tablet/lovastatin (MEVACOR) 40 MG tablet/metoprolol succinate (TOPROL-XL) 25 MG 24 hr tablet/Pt requesting refills stating he will run out of medication on Thursday 11/28/18. Requesting CB if there are any issues.  Has the patient contacted their pharmacy? Yes.   (Agent: If no, request that the patient contact the pharmacy for the refill.) (Agent: If yes, when and what did the pharmacy advise?)  Preferred Pharmacy (with phone number or street name):  [id number] - [place], [place]. AT [address] [phone] (Phone) [phone] (Fax)     Agent: Please be advised that RX refills may take up to 3 business days. We ask that you follow-up with your pharmacy.

## 2018-12-18 NOTE — Progress Notes (Signed)
Cardiology Office Note   Date:  12/20/2018   ID:  Austin Lloyd, DOB 28-Sep-1929, MRN [id number]  PCP:  Shelda Pal, DO  Cardiologist:   No primary care provider on file.   Chief Complaint  Patient presents with  . Shortness of Breath      History of Present Illness: Austin Lloyd is a 85 y.o. male who presents for evaluation of CAD s/p 2V CABG in 2016 in [place].  He had ischemic cardiomyopathy.  He had severe AS and had successful TAVR with a78mm Edwards Sapien 3 THV via the TF approach on 02/05/18. Post operative echoshowed EF 40-45%, normally functioning TAVR with trivial PVL; mean gradient 8 mm Hg.  However, follow-up echo in December demonstrated that the EF was now reduced at 25 to 30%.  He had normal valve function.  Since I last saw him since him he has done well.  He denies any new cardiovascular symptoms.  He walks every day.  He denies any new shortness of breath although he has got some chronic dyspnea with exertion.  He is not really noticed this is much as his weight goes.  He thinks he does fine.  He is limited a little bit by hip pain.  Denies any chest pressure, neck or arm discomfort.  He has had no new palpitations, presyncope or syncope.  Is not describing PND or orthopnea.  No fevers or chills.   Past Medical History:  Diagnosis Date  . Aortic stenosis   . Bladder cancer ([place])    T1 status post BCG  . 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 Coronary artery disease with history of myocardial infarction without history of CABG    LIMA to the LAD, SVG to RCA 2016 for treatment of the percent left main stenosis and 80% RCA stenosis.    . Depression   . Diabetes mellitus, type II ([place])   . Hypertension   . Ischemic cardiomyopathy    EF 40% 2019  . Lung cancer ([place])    Adenocarcinoma of the right lung moderately differentiated  . Lung mass   . PVC's (premature  ventricular contractions)   . S/P TAVR (transcatheter aortic valve replacement)     Past Surgical History:  Procedure Laterality Date  . CORONARY ARTERY BYPASS GRAFT     2014  . INGUINAL HERNIA REPAIR    . INTRAOPERATIVE TRANSTHORACIC ECHOCARDIOGRAM  02/05/2018   Procedure: INTRAOPERATIVE TRANSTHORACIC ECHOCARDIOGRAM;  Surgeon: [name], MD;  Location: [facility];  Service: Open Heart Surgery;;  . LUNG REMOVAL, PARTIAL    . RIGHT/LEFT HEART CATH AND CORONARY/GRAFT ANGIOGRAPHY N/A 12/21/2017   Procedure: RIGHT/LEFT HEART CATH AND CORONARY/GRAFT ANGIOGRAPHY;  Surgeon: [name], MD;  Location: [place] CV LAB;  Service: Cardiovascular;  Laterality: N/A;  . TRANSCATHETER AORTIC VALVE REPLACEMENT, TRANSFEMORAL  02/05/2018   TRANSCATHETER AORTIC VALVE REPLACEMENT, TRANSFEMORAL (  . TRANSCATHETER AORTIC VALVE REPLACEMENT, TRANSFEMORAL N/A 02/05/2018   Procedure: TRANSCATHETER AORTIC VALVE REPLACEMENT, TRANSFEMORAL;  Surgeon: [name], MD;  Location: [place];  Service: Open Heart Surgery;  Laterality: N/A;  . Ulcer surgery       Current Outpatient Medications  Medication Sig Dispense Refill  . ACCU-CHEK SOFTCLIX LANCETS lancets Test blood sugar three times a day.  DX E11.9 200 each 6  . aspirin EC 81 MG tablet Take 81 mg by mouth every evening.     . clopidogrel (PLAVIX)  75 MG tablet Take 1 tablet (75 mg total) by mouth daily with breakfast. 90 tablet 1  . ferrous sulfate 325 (65 FE) MG tablet Take 325 mg by mouth daily with breakfast.    . furosemide (LASIX) 40 MG tablet Take 1 tablet (40 mg total) by mouth daily. 90 tablet 3  . glucose blood (ACCU-CHEK AVIVA PLUS) test strip Test blood sugar three times a day.  DX E11.9 200 each 6  . Insulin Pen Needle 31G X 8 MM MISC To use with insulin 100 each 3  . LEVEMIR FLEXTOUCH 100 UNIT/ML Pen INJECT 31 UNITS INTO THE SKIN DAILY. 15 mL 6  . levocetirizine (XYZAL) 5 MG tablet Take 1 tablet (5 mg total) by mouth  every evening. 90 tablet 2  . losartan (COZAAR) 25 MG tablet Take 1 tablet (25 mg total) by mouth daily. 90 tablet 3  . lovastatin (MEVACOR) 40 MG tablet Take 1 tablet (40 mg total) by mouth at bedtime. 90 tablet 0  . Melatonin 5 MG TABS Take 5 mg by mouth at bedtime as needed (for sleep).     . metoprolol succinate (TOPROL-XL) 25 MG 24 hr tablet Take 1 tablet (25 mg total) by mouth 2 (two) times daily. 180 tablet 0  . Multiple Vitamin (MULTIVITAMIN) tablet Take 1 tablet by mouth daily.    . tamsulosin (FLOMAX) 0.4 MG CAPS capsule Take 1 capsule (0.4 mg total) by mouth daily. 90 capsule 3  . traZODone (DESYREL) 50 MG tablet Take 0.5-1 tablets (25-50 mg total) by mouth at bedtime as needed for sleep. 30 tablet 3   No current facility-administered medications for this visit.     Allergies:   Patient has no known allergies.    ROS:  Please see the history of present illness.   Otherwise, review of systems are positive for none.   All other systems are reviewed and negative.    PHYSICAL EXAM: VS:  BP (!) 169/80   Pulse 80   Temp (!) 96.8 F (36 C)   Ht 5\' 4"  (1.626 m)   Wt 156 lb 6.4 oz (70.9 kg)   SpO2 93%   BMI 26.85 kg/m  , BMI Body mass index is 26.85 kg/m. GENERAL:  Well appearing NECK:  No jugular venous distention, waveform within normal limits, carotid upstroke brisk and symmetric, no bruits, no thyromegaly LUNGS:  Clear to auscultation bilaterally CHEST:  Well healed sternotomy scar.  HEART:  PMI not displaced or sustained,S1 and S2 within normal limits, no S3, no S4, no clicks, no rubs, 2 out of 6 apical systolic murmur slightly radiating, no diastolic murmurs ABD:  Flat, positive bowel sounds normal in frequency in pitch, no bruits, no rebound, no guarding, no midline pulsatile mass, no hepatomegaly, no splenomegaly EXT:  2 plus pulses throughout, no edema, no cyanosis no clubbing   EKG:  EKG is ordered today. The ekg ordered today demonstrates sinus rhythm, rate 80,  axis within normal limits, borderline interventricular conduction delay, premature ventricular contractions in a bigeminal pattern   Recent Labs: 02/01/2018: B Natriuretic Peptide 447.9 02/06/2018: Magnesium 1.8 03/11/2018: NT-Pro BNP 2,881 08/19/2018: Hemoglobin 13.4; Platelets 151 08/28/2018: ALT 26; BUN 51; Creatinine, Ser 1.59; Potassium 4.9; Sodium 139    Lipid Panel    Component Value Date/Time   CHOL 187 08/28/2018 1033   CHOL 183 11/06/2017 0815   TRIG 121.0 08/28/2018 1033   HDL 54.00 08/28/2018 1033   HDL 58 11/06/2017 0815   CHOLHDL 3 08/28/2018 1033  VLDL 24.2 08/28/2018 1033   LDLCALC 109 (H) 08/28/2018 1033   LDLCALC 108 (H) 11/06/2017 0815      Wt Readings from Last 3 Encounters:  12/20/18 156 lb 6.4 oz (70.9 kg)  08/28/18 153 lb (69.4 kg)  08/19/18 150 lb 6.4 oz (68.2 kg)      Other studies Reviewed: Additional studies/ records that were reviewed today include: Labs. Review of the above records demonstrates:  Please see elsewhere in the note.     ASSESSMENT AND PLAN:  Severe AS s/p TAVR:   I am going to arrange to follow-up yearly in the structural heart clinic.  He has had normal heart valve function.  We will make med changes as below.  Chronic combined S/D CHF:      His EF is reduced.  He was on a higher dose of Cozaar years ago before he moved here.  He said it was stopped because of some other noncardiac symptoms he thought was related.  He does agree to try to go up to 25 mg of the Cozaar.  We could slowly try to titrate this over time as he tolerates.   CKD stage III:   Baseline creat 1.59.  I will check a basic metabolic profile today.\  HTN:BP is elevated as above but this was unusually high.  I repeated it in and it was 148/70.  He agrees to the increase Cozaar.   CAD s/p CABG: Pre TAVR cath showed patent bypass grafts.  No change in therapy.    Current medicines are reviewed at length with the patient today.  The patient does not have  concerns regarding medicines.  The following changes have been made:  As above  Labs/ tests ordered today include:   Orders Placed This Encounter  Procedures  . Basic metabolic panel  . EKG 12-Lead     Disposition:   FU with me in June, six months after the Structural appt.     Signed, [name], MD  12/20/2018 9:31 AM    [facility]

## 2018-12-20 ENCOUNTER — Encounter: Payer: Self-pay | Admitting: Cardiology

## 2018-12-20 ENCOUNTER — Other Ambulatory Visit: Payer: Self-pay | Admitting: Family Medicine

## 2018-12-20 ENCOUNTER — Ambulatory Visit (INDEPENDENT_AMBULATORY_CARE_PROVIDER_SITE_OTHER): Payer: Medicare Other | Admitting: Cardiology

## 2018-12-20 ENCOUNTER — Other Ambulatory Visit: Payer: Self-pay

## 2018-12-20 VITALS — BP 169/80 | HR 80 | Temp 96.8°F | Ht 64.0 in | Wt 156.4 lb

## 2018-12-20 DIAGNOSIS — I2510 Atherosclerotic heart disease of native coronary artery without angina pectoris: Secondary | ICD-10-CM | POA: Diagnosis not present

## 2018-12-20 DIAGNOSIS — I5042 Chronic combined systolic (congestive) and diastolic (congestive) heart failure: Secondary | ICD-10-CM | POA: Diagnosis not present

## 2018-12-20 DIAGNOSIS — Z952 Presence of prosthetic heart valve: Secondary | ICD-10-CM

## 2018-12-20 MED ORDER — LOSARTAN POTASSIUM 25 MG PO TABS: 25.0000 mg | ORAL_TABLET | Freq: Every day | ORAL | 3 refills | Status: DC

## 2018-12-20 NOTE — Patient Instructions (Addendum)
Medication Instructions:  Increase your Cozar to 25mg  daily.  If you need a refill on your cardiac medications before your next appointment, please call your pharmacy.   Lab work: BMET If you have labs (blood work) drawn today and your tests are completely normal, you will receive your results only by: [place] (if you have MyChart) OR A paper copy in the mail If you have any lab test that is abnormal or we need to change your treatment, we will call you to review the results.  Testing/Procedures: NONE  Follow-Up: At [facility], you and your health needs are our priority.  As part of our continuing mission to provide you with exceptional heart care, we have created designated Provider Care Teams.  These Care Teams include your primary Cardiologist (physician) and Advanced Practice Providers (APPs -  Physician Assistants and Nurse Practitioners) who all work together to provide you with the care you need, when you need it. You will need a follow up appointment in 8 months.  Please call our office 2 months in advance to schedule this appointment.  You may see [name] or one of the following Advanced Practice Providers on your designated Care Team:   Rosaria Ferries, PA-C Jory Sims, DNP, ANP  Any Other Special Instructions Will Be Listed Below (If Applicable). You will receive a call about making an appointment in December at the structural heart clinic.

## 2018-12-21 LAB — BASIC METABOLIC PANEL
BUN/Creatinine Ratio: 25 — ABNORMAL HIGH (ref 10–24)
BUN: 43 mg/dL — ABNORMAL HIGH (ref 8–27)
CO2: 23 mmol/L (ref 20–29)
Calcium: 9 mg/dL (ref 8.6–10.2)
Chloride: 103 mmol/L (ref 96–106)
Creatinine, Ser: 1.75 mg/dL — ABNORMAL HIGH (ref 0.76–1.27)
GFR calc Af Amer: 40 mL/min/{1.73_m2} — ABNORMAL LOW (ref >59)
GFR calc non Af Amer: 34 mL/min/{1.73_m2} — ABNORMAL LOW (ref >59)
Glucose: 219 mg/dL — ABNORMAL HIGH (ref 65–99)
Potassium: 5 mmol/L (ref 3.5–5.2)
Sodium: 142 mmol/L (ref 134–144)

## 2018-12-23 ENCOUNTER — Telehealth: Payer: Self-pay

## 2018-12-23 DIAGNOSIS — N183 Chronic kidney disease, stage 3 unspecified: Secondary | ICD-10-CM

## 2018-12-23 NOTE — Telephone Encounter (Signed)
-----   Message from [name], MD sent at 12/22/2018  7:07 PM EDT ----- Creatinine is slightly increased.  Repeat BMET in two weeks.  Call Mr. Vivianne Master with the results and send results to Shelda Pal, DO

## 2018-12-23 NOTE — Telephone Encounter (Signed)
Called results to pt. Verbalized understanding. Advised pt to come in two weeks to get labs redrawn. Put in order. Routed to PCP.

## 2018-12-30 DIAGNOSIS — L602 Onychogryphosis: Secondary | ICD-10-CM | POA: Diagnosis not present

## 2018-12-30 DIAGNOSIS — E1159 Type 2 diabetes mellitus with other circulatory complications: Secondary | ICD-10-CM | POA: Diagnosis not present

## 2018-12-30 DIAGNOSIS — L84 Corns and callosities: Secondary | ICD-10-CM | POA: Diagnosis not present

## 2019-01-06 DIAGNOSIS — N183 Chronic kidney disease, stage 3 unspecified: Secondary | ICD-10-CM | POA: Diagnosis not present

## 2019-01-07 LAB — BASIC METABOLIC PANEL
BUN/Creatinine Ratio: 25 — ABNORMAL HIGH (ref 10–24)
BUN: 46 mg/dL — ABNORMAL HIGH (ref 8–27)
CO2: 26 mmol/L (ref 20–29)
Calcium: 9.5 mg/dL (ref 8.6–10.2)
Chloride: 104 mmol/L (ref 96–106)
Creatinine, Ser: 1.85 mg/dL — ABNORMAL HIGH (ref 0.76–1.27)
GFR calc Af Amer: 37 mL/min/{1.73_m2} — ABNORMAL LOW (ref >59)
GFR calc non Af Amer: 32 mL/min/{1.73_m2} — ABNORMAL LOW (ref >59)
Glucose: 134 mg/dL — ABNORMAL HIGH (ref 65–99)
Potassium: 5.6 mmol/L — ABNORMAL HIGH (ref 3.5–5.2)
Sodium: 142 mmol/L (ref 134–144)

## 2019-01-08 ENCOUNTER — Telehealth: Payer: Self-pay

## 2019-01-08 DIAGNOSIS — I350 Nonrheumatic aortic (valve) stenosis: Secondary | ICD-10-CM

## 2019-01-08 NOTE — Telephone Encounter (Signed)
-----   Message from [name], MD sent at 01/07/2019  5:29 PM EDT ----- I don't think that he is tolerating the Cozaar so I would stop this and repeat a BMET in 10 days.  He needs to return a BP diary in two weeks.  Call Mr. Vivianne Master with the results and send results to Shelda Pal, DO

## 2019-01-08 NOTE — Telephone Encounter (Signed)
Spoke to pt wife per DPR. Verbalized understanding to stop Cozaar and to get repeat labs in 10 days. Recommended getting [facility] for BP diary. Pt wife verbalized understanding.

## 2019-01-13 ENCOUNTER — Other Ambulatory Visit: Payer: Self-pay | Admitting: Physician Assistant

## 2019-01-13 DIAGNOSIS — Z952 Presence of prosthetic heart valve: Secondary | ICD-10-CM

## 2019-01-16 ENCOUNTER — Telehealth: Payer: Self-pay | Admitting: Cardiology

## 2019-01-16 NOTE — Telephone Encounter (Signed)
New message   Pt c/o BP issue: STAT if pt c/o blurred vision, one-sided weakness or slurred speech  1. What are your last 5 BP readings? 131/69 133/70 119/70 129/62 128/63 2. Are you having any other symptoms (ex. Dizziness, headache, blurred vision, passed out)? Patient's wife states that he is weak and pulse low   3. What is your BP issue? Patient has a history of high blood pressure

## 2019-01-16 NOTE — Telephone Encounter (Signed)
Spoke with pt wife who states pt has been 'weak/lethargic' and irritable  and HR running low. Nurse can hear pt in the background communicating with wife. She reports that pt BP and HR readings are as follows:  10/26 135/66 HR 39 10/26 129/72 HR 39 10/27 131/69 HR 42 10/28 118/70 HR 39 10/28 129/62 HR 38  Pt on Lasix 40 mg daily, metoprolol 25 mg BID, and was on losartan 25 mg daily until 10/21 when he was advised to hold losartan d/t ser. Creat. And return for repeat BMP in 10 days. Pt wife states pt has had issues with kidneys in the past. Recommended that pt come in for lab work before an appt with an APP tomorrow. Protocol to hold BB with bradycardia and consult a provider  Consulted DOD Dr. Gardiner Rhyme who recommended that pt hold metoprolol for now d/t bradycardia, schedule f/u appt with APP tomorrow, and have pt get repeat BMP done tomorrow as well. Recommended that pt report to ED with continued/additional symptoms such as lightheadedness, confusion, syncope, SOB. Pt wife updated with recommendations. She will monitor pt and have pt present for lab work tomorrow. Appt scheduled with Kerin Ransom, PA-C for 10/30 at 2:45pm

## 2019-01-17 ENCOUNTER — Other Ambulatory Visit: Payer: Self-pay

## 2019-01-17 ENCOUNTER — Encounter: Payer: Self-pay | Admitting: Cardiology

## 2019-01-17 ENCOUNTER — Ambulatory Visit (INDEPENDENT_AMBULATORY_CARE_PROVIDER_SITE_OTHER): Payer: Medicare Other | Admitting: Cardiology

## 2019-01-17 VITALS — BP 145/82 | HR 62 | Temp 97.9°F | Ht 64.0 in | Wt 156.0 lb

## 2019-01-17 DIAGNOSIS — R001 Bradycardia, unspecified: Secondary | ICD-10-CM | POA: Diagnosis not present

## 2019-01-17 DIAGNOSIS — Z952 Presence of prosthetic heart valve: Secondary | ICD-10-CM | POA: Diagnosis not present

## 2019-01-17 DIAGNOSIS — I2510 Atherosclerotic heart disease of native coronary artery without angina pectoris: Secondary | ICD-10-CM | POA: Diagnosis not present

## 2019-01-17 DIAGNOSIS — N1831 Chronic kidney disease, stage 3a: Secondary | ICD-10-CM

## 2019-01-17 DIAGNOSIS — I429 Cardiomyopathy, unspecified: Secondary | ICD-10-CM

## 2019-01-17 DIAGNOSIS — Z951 Presence of aortocoronary bypass graft: Secondary | ICD-10-CM | POA: Diagnosis not present

## 2019-01-17 DIAGNOSIS — R531 Weakness: Secondary | ICD-10-CM | POA: Diagnosis not present

## 2019-01-17 DIAGNOSIS — I10 Essential (primary) hypertension: Secondary | ICD-10-CM | POA: Diagnosis not present

## 2019-01-17 LAB — BASIC METABOLIC PANEL
BUN/Creatinine Ratio: 25 — ABNORMAL HIGH (ref 10–24)
BUN: 40 mg/dL — ABNORMAL HIGH (ref 8–27)
CO2: 19 mmol/L — ABNORMAL LOW (ref 20–29)
Calcium: 9.3 mg/dL (ref 8.6–10.2)
Chloride: 104 mmol/L (ref 96–106)
Creatinine, Ser: 1.62 mg/dL — ABNORMAL HIGH (ref 0.76–1.27)
GFR calc Af Amer: 43 mL/min/{1.73_m2} — ABNORMAL LOW (ref >59)
GFR calc non Af Amer: 38 mL/min/{1.73_m2} — ABNORMAL LOW (ref >59)
Glucose: 158 mg/dL — ABNORMAL HIGH (ref 65–99)
Potassium: 4.8 mmol/L (ref 3.5–5.2)
Sodium: 141 mmol/L (ref 134–144)

## 2019-01-17 MED ORDER — METOPROLOL SUCCINATE ER 25 MG PO TB24: 25.0000 mg | ORAL_TABLET | Freq: Every day | ORAL | 2 refills | Status: DC

## 2019-01-17 NOTE — Progress Notes (Signed)
Cardiology Office Note:    Date:  01/17/2019   ID:  Austin Lloyd, DOB Jun 19, 1929, MRN [id number]  PCP:  Shelda Pal, DO  Cardiologist:  Dr Percival Spanish  Electrophysiologist:  None   Referring MD: Shelda Pal*   No chief complaint on file. Low heart rate on home monitor  History of Present Illness:    Austin Lloyd is a 85 y.o. male with a hx of CAD and AS. The patient had CABG x 2 with an LIMA-LAD and SVG to RCA in 2016 in [place].  He had [facility]R at [facility] in November 2019. Post op echo showed an EF of 40-45%, however f/u echo in Dec 2019 showed his EF to be 25-30%.   Other medical issues include IDDM, HTN, CRI-3, and a history of CLL and lung cancer.  In 2012 he was treated with chemotherapy for his CLL and in feb 2015 he had RUL lobectomy for lung cancer.   The patient saw Dr Percival Spanish in the office 12/20/2018.  Dr Percival Spanish suggested increasing the pt's Cozaar to to 25 mg. Labs and a f/u echo were ordered.  The patient was to follow his B/P at home. A few days later his wife called concerned secondary to the patient complaining of "weakness".  It was decided to stop his Cozaar thinking that was the cause of the patients symptoms.  Two weeks later the patients wife called concerned because of low HR reading on his B/P monitor- HR 30-40.  His Toprol 25 mg BID was stopped and he was added to my scheduled for further evaluation.    The patient says he is still weak- no change in his symptoms of generalized weakness (he seems pretty stoic). He denies syncope or near syncope.  On reviewing his chart and old EKGs he has frequent PVCs.  His HR today is 80- B/P 150/72.  I suspect his low heart rate readings are secondary to PVCs.   Past Medical History:  Diagnosis Date  . Aortic stenosis   . Bladder cancer ([place])    T1 status post BCG  . 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 Coronary artery  disease with history of myocardial infarction without history of CABG    LIMA to the LAD, SVG to RCA 2016 for treatment of the percent left main stenosis and 80% RCA stenosis.    . Depression   . Diabetes mellitus, type II ([place])   . Hypertension   . Ischemic cardiomyopathy    EF 40% 2019  . Lung cancer ([place])    Adenocarcinoma of the right lung moderately differentiated  . Lung mass   . PVC's (premature ventricular contractions)   . S/P TAVR (transcatheter aortic valve replacement)     Past Surgical History:  Procedure Laterality Date  . CORONARY ARTERY BYPASS GRAFT     2014  . INGUINAL HERNIA REPAIR    . INTRAOPERATIVE TRANSTHORACIC ECHOCARDIOGRAM  02/05/2018   Procedure: INTRAOPERATIVE TRANSTHORACIC ECHOCARDIOGRAM;  Surgeon: [name], MD;  Location: [facility] OR;  Service: Open Heart Surgery;;  . LUNG REMOVAL, PARTIAL    . RIGHT/LEFT HEART CATH AND CORONARY/GRAFT ANGIOGRAPHY N/A 12/21/2017   Procedure: RIGHT/LEFT HEART CATH AND CORONARY/GRAFT ANGIOGRAPHY;  Surgeon: [name], MD;  Location: [place] CV LAB;  Service: Cardiovascular;  Laterality: N/A;  . TRANSCATHETER AORTIC VALVE REPLACEMENT, TRANSFEMORAL  02/05/2018   TRANSCATHETER AORTIC VALVE REPLACEMENT, TRANSFEMORAL (  . TRANSCATHETER AORTIC VALVE REPLACEMENT,  TRANSFEMORAL N/A 02/05/2018   Procedure: TRANSCATHETER AORTIC VALVE REPLACEMENT, TRANSFEMORAL;  Surgeon: [name], MD;  Location: [place];  Service: Open Heart Surgery;  Laterality: N/A;  . Ulcer surgery      Current Medications: Current Meds  Medication Sig  . ACCU-CHEK SOFTCLIX LANCETS lancets Test blood sugar three times a day.  DX E11.9  . aspirin EC 81 MG tablet Take 81 mg by mouth every evening.   . clopidogrel (PLAVIX) 75 MG tablet Take 1 tablet (75 mg total) by mouth daily with breakfast.  . ferrous sulfate 325 (65 FE) MG tablet Take 325 mg by mouth daily with breakfast.  . furosemide (LASIX) 40 MG tablet Take 1 tablet (40  mg total) by mouth daily.  Marland Kitchen glucose blood (ACCU-CHEK AVIVA PLUS) test strip Test blood sugar three times a day.  DX E11.9  . Insulin Pen Needle 31G X 8 MM MISC To use with insulin  . LEVEMIR FLEXTOUCH 100 UNIT/ML Pen INJECT 31 UNITS INTO THE SKIN DAILY.  Marland Kitchen levocetirizine (XYZAL) 5 MG tablet Take 1 tablet (5 mg total) by mouth every evening.  . lovastatin (MEVACOR) 40 MG tablet Take 1 tablet (40 mg total) by mouth at bedtime.  . Melatonin 5 MG TABS Take 5 mg by mouth at bedtime as needed (for sleep).   . Multiple Vitamin (MULTIVITAMIN) tablet Take 1 tablet by mouth daily.  . tamsulosin (FLOMAX) 0.4 MG CAPS capsule TAKE 1 CAPSULE BY MOUTH EVERY DAY  . traZODone (DESYREL) 50 MG tablet Take 0.5-1 tablets (25-50 mg total) by mouth at bedtime as needed for sleep.  . [DISCONTINUED] metoprolol succinate (TOPROL-XL) 25 MG 24 hr tablet Take 1 tablet (25 mg total) by mouth 2 (two) times daily.     Allergies:   Patient has no known allergies.   Social History   Socioeconomic History  . Marital status: Married    Spouse name: Not on file  . Number of children: 3  . Years of education: Not on file  . Highest education level: Not on file  Occupational History  . 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 Financial resource strain: Not on file  . Food insecurity    Worry: Not on file    Inability: Not on file  . Transportation needs    Medical: Not on file    Non-medical: Not on file  Tobacco Use  . Smoking status: Former Smoker    Packs/day: 0.20    Years: 30.00    Pack years: 6.00    Types: Cigarettes  . Smokeless tobacco: Never Used  Substance and Sexual Activity  . Alcohol use: Yes    Alcohol/week: 2.0 standard drinks    Types: 2 Cans of beer per week    Comment: 2 cans beer per week.  . Drug use: Never  . Sexual activity: Not on file  Lifestyle  . Physical activity    Days per week: Not on file    Minutes per session: Not on file  .  Stress: Not on file  Relationships  . Social Herbalist on phone: Not on file    Gets together: Not on file    Attends religious service: Not on file    Active member of club or organization: Not on file    Attends meetings of clubs or organizations: Not on file    Relationship status: Not on file  Other Topics Concern  .  Not on file  Social History Narrative   Lives with wife daughter.  Three children and one grandchild.      Family History: The patient's family history includes AAA (abdominal aortic aneurysm) in his mother; Cancer in his father.  ROS:   Please see the history of present illness.     All other systems reviewed and are negative.  EKGs/Labs/Other Studies Reviewed:    The following studies were reviewed today: Echo dec 2019  EKG:  EKG is ordered today.  The ekg ordered today demonstrates NSR- HR 75  Recent Labs: 02/01/2018: B Natriuretic Peptide 447.9 02/06/2018: Magnesium 1.8 03/11/2018: NT-Pro BNP 2,881 08/19/2018: Hemoglobin 13.4; Platelets 151 08/28/2018: ALT 26 01/06/2019: BUN 46; Creatinine, Ser 1.85; Potassium 5.6; Sodium 142  Recent Lipid Panel    Component Value Date/Time   CHOL 187 08/28/2018 1033   CHOL 183 11/06/2017 0815   TRIG 121.0 08/28/2018 1033   HDL 54.00 08/28/2018 1033   HDL 58 11/06/2017 0815   CHOLHDL 3 08/28/2018 1033   VLDL 24.2 08/28/2018 1033   LDLCALC 109 (H) 08/28/2018 1033   LDLCALC 108 (H) 11/06/2017 0815    Physical Exam:    VS:  BP (!) 145/82 (BP Location: Left Arm)   Pulse 62   Temp 97.9 F (36.6 C)   Ht 5\' 4"  (1.626 m)   Wt 156 lb (70.8 kg)   SpO2 92%   BMI 26.78 kg/m     Wt Readings from Last 3 Encounters:  01/17/19 156 lb (70.8 kg)  12/20/18 156 lb 6.4 oz (70.9 kg)  08/28/18 153 lb (69.4 kg)     GEN:  Well nourished, well developed in no acute distress HEENT: Normal NECK: No JVD; No carotid bruits LYMPHATICS: No lymphadenopathy CARDIAC: RRR 2/6 systolic murmur AOV, no rubs,  gallops RESPIRATORY: basilar crackles,   without rales, wheezing or rhonchi  ABDOMEN: Soft, non-tender, non-distended MUSCULOSKELETAL:  No edema; No deformity  SKIN: Warm and dry NEUROLOGIC:  Alert and oriented x 3 PSYCHIATRIC:  Normal affect   ASSESSMENT:    Bradycardia I suspect this a false reading on home B/P machine secondary to PVCs Resume Toprol at 25 mg daily  Cardiomyopathy ([place]) EF 40-45% immediately post TAVR but echo Dec 2019 showed EF 25-30%  S/P TAVR (transcatheter aortic valve replacement) TAVR Nov 2019  Hx of CABG CABG x 2 (LIMA-LAD-SVG-RCA) 2016 in [place] grafts at cath Oct 2019  Essential hypertension Losartan stopped for perceived side effects (weakness), his B/P is now running a little high  CKD (chronic kidney disease), stage III ([place]) Today's lab is pending  Weakness ? etiology  PLAN:    Resume Toprol 25 mg daily.  Keep f/u with dr Percival Spanish as scheduled 11/5.    Medication Adjustments/Labs and Tests Ordered: Current medicines are reviewed at length with the patient today.  Concerns regarding medicines are outlined above.  Orders Placed This Encounter  Procedures  . EKG 12-Lead   Meds ordered this encounter  Medications  . metoprolol succinate (TOPROL XL) 25 MG 24 hr tablet    Sig: Take 1 tablet (25 mg total) by mouth daily.    Dispense:  30 tablet    Refill:  2    Patient Instructions  Medication Instructions:  RESUME Metoprolol Succinate to 1 tablet once a day  *If you need a refill on your cardiac medications before your next appointment, please call your pharmacy*  Lab Work: None  If you have labs (blood work) drawn today and your  tests are completely normal, you will receive your results only by: [place] MyChart Message (if you have MyChart) OR . A paper copy in the mail If you have any lab test that is abnormal or we need to change your treatment, we will call you to review the results.  Testing/Procedures: None    Follow-Up: At [facility], you and your health needs are our priority.  As part of our continuing mission to provide you with exceptional heart care, we have created designated Provider Care Teams.  These Care Teams include your primary Cardiologist (physician) and Advanced Practice Providers (APPs -  Physician Assistants and Nurse Practitioners) who all work together to provide you with the care you need, when you need it.  Your next appointment:   AS SCHEDULED   The format for your next appointment:   In Person  Provider:   [name], MD  Other Instructions     Signed, Austin Ransom, Austin Lloyd  01/17/2019 3:40 PM    [place]

## 2019-01-17 NOTE — Assessment & Plan Note (Addendum)
I suspect this a false reading on home B/P machine secondary to PVCs Resume Toprol at 25 mg daily

## 2019-01-17 NOTE — Assessment & Plan Note (Signed)
Today's lab is pending

## 2019-01-17 NOTE — Assessment & Plan Note (Signed)
TAVR Nov 2019

## 2019-01-17 NOTE — Assessment & Plan Note (Signed)
Losartan stopped for perceived side effects (weakness), his B/P is now running a little high

## 2019-01-17 NOTE — Assessment & Plan Note (Signed)
?   etiology

## 2019-01-17 NOTE — Assessment & Plan Note (Signed)
CABG x 2 (LIMA-LAD-SVG-RCA) 2016 in [place] grafts at cath Oct 2019

## 2019-01-17 NOTE — Patient Instructions (Signed)
Medication Instructions:  RESUME Metoprolol Succinate to 1 tablet once a day  *If you need a refill on your cardiac medications before your next appointment, please call your pharmacy*  Lab Work: None  If you have labs (blood work) drawn today and your tests are completely normal, you will receive your results only by: Marland Kitchen MyChart Message (if you have MyChart) OR . A paper copy in the mail If you have any lab test that is abnormal or we need to change your treatment, we will call you to review the results.  Testing/Procedures: None   Follow-Up: At [facility], you and your health needs are our priority.  As part of our continuing mission to provide you with exceptional heart care, we have created designated Provider Care Teams.  These Care Teams include your primary Cardiologist (physician) and Advanced Practice Providers (APPs -  Physician Assistants and Nurse Practitioners) who all work together to provide you with the care you need, when you need it.  Your next appointment:   AS SCHEDULED   The format for your next appointment:   In Person  Provider:   [name], MD  Other Instructions

## 2019-01-17 NOTE — Assessment & Plan Note (Signed)
EF 40-45% immediately post TAVR but echo Dec 2019 showed EF 25-30%

## 2019-01-20 ENCOUNTER — Ambulatory Visit (HOSPITAL_COMMUNITY)
Admission: RE | Admit: 2019-01-20 | Discharge: 2019-01-20 | Disposition: A | Discharge status: Home / Self Care | Payer: Medicare Other | Source: Ambulatory Visit | Attending: Hematology | Admitting: Hematology

## 2019-01-20 ENCOUNTER — Other Ambulatory Visit: Payer: Self-pay

## 2019-01-20 DIAGNOSIS — R918 Other nonspecific abnormal finding of lung field: Secondary | ICD-10-CM | POA: Diagnosis not present

## 2019-01-20 DIAGNOSIS — C9110 Chronic lymphocytic leukemia of B-cell type not having achieved remission: Secondary | ICD-10-CM | POA: Diagnosis not present

## 2019-01-22 ENCOUNTER — Other Ambulatory Visit: Payer: Self-pay

## 2019-01-22 ENCOUNTER — Ambulatory Visit (HOSPITAL_COMMUNITY): Payer: Medicare Other | Attending: Cardiovascular Disease

## 2019-01-22 DIAGNOSIS — Z952 Presence of prosthetic heart valve: Secondary | ICD-10-CM | POA: Insufficient documentation

## 2019-01-22 DIAGNOSIS — I2510 Atherosclerotic heart disease of native coronary artery without angina pectoris: Secondary | ICD-10-CM | POA: Insufficient documentation

## 2019-01-22 NOTE — Progress Notes (Addendum)
Cardiology Office Note   Date:  01/23/2019   ID:  Austin Lloyd, DOB 17-Feb-1930, MRN [id number]  PCP:  Shelda Pal, DO  Cardiologist:   [name], MD   Chief Complaint  Patient presents with  . Bradycardia      History of Present Illness: Austin Lloyd is a 85 y.o. male who presents for evaluation of CAD s/p 2V CABG in 2016 in [place].  He had ischemic cardiomyopathy.  He had severe AS and had successful TAVR with a51mm Edwards Sapien 3 THV via the TF approach on 02/05/18. Post operative echoshowed EF 40-45%, normally functioning TAVR with trivial PVL; mean gradient 8 mm Hg.  However, follow-up echo in December demonstrated that the EF was now reduced at 25 to 30%.  He had normal valve function.  He had a repeat echo yesterday and the EF was unchanged at 30 - 35%.  He had stable TAVR.    Recently his wife noticed that his heart rate was going down.  He did not have any presyncope or syncope.  However, his machine was often seen at 38 or 40 bpm.  He is actually been feeling relatively well.  He does get short of breath if he walks a moderate distance but this is not any different.  He is not having any resting shortness of breath.  He does not have any PND or orthopnea.  He has no weight gain or edema.  Recently I checked his creatinine it was rising.  Took him off of his ARB and followed up and his creatinine came back down to baseline.  Blood pressures have been reasonably well controlled.  Past Medical History:  Diagnosis Date  . Aortic stenosis   . Bladder cancer ([place])    T1 status post BCG  . 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 Coronary artery disease with history of myocardial infarction without history of CABG    LIMA to the LAD, SVG to RCA 2016 for treatment of the percent left main stenosis and 80% RCA stenosis.    . Depression   . Diabetes mellitus, type II ([place])   .  Hypertension   . Ischemic cardiomyopathy    EF 40% 2019  . Lung cancer ([place])    Adenocarcinoma of the right lung moderately differentiated  . Lung mass   . PVC's (premature ventricular contractions)   . S/P TAVR (transcatheter aortic valve replacement)     Past Surgical History:  Procedure Laterality Date  . CORONARY ARTERY BYPASS GRAFT     2014  . INGUINAL HERNIA REPAIR    . INTRAOPERATIVE TRANSTHORACIC ECHOCARDIOGRAM  02/05/2018   Procedure: INTRAOPERATIVE TRANSTHORACIC ECHOCARDIOGRAM;  Surgeon: [name], MD;  Location: [facility] OR;  Service: Open Heart Surgery;;  . LUNG REMOVAL, PARTIAL    . RIGHT/LEFT HEART CATH AND CORONARY/GRAFT ANGIOGRAPHY N/A 12/21/2017   Procedure: RIGHT/LEFT HEART CATH AND CORONARY/GRAFT ANGIOGRAPHY;  Surgeon: [name], MD;  Location: [facility];  Service: Cardiovascular;  Laterality: N/A;  . TRANSCATHETER AORTIC VALVE REPLACEMENT, TRANSFEMORAL  02/05/2018   TRANSCATHETER AORTIC VALVE REPLACEMENT, TRANSFEMORAL (  . TRANSCATHETER AORTIC VALVE REPLACEMENT, TRANSFEMORAL N/A 02/05/2018   Procedure: TRANSCATHETER AORTIC VALVE REPLACEMENT, TRANSFEMORAL;  Surgeon: [name], MD;  Location: [facility];  Service: Open Heart Surgery;  Laterality: N/A;  . Ulcer surgery       Current Outpatient Medications  Medication Sig Dispense Refill  .  ACCU-CHEK SOFTCLIX LANCETS lancets Test blood sugar three times a day.  DX E11.9 200 each 6  . aspirin EC 81 MG tablet Take 81 mg by mouth every evening.     . furosemide (LASIX) 40 MG tablet Take 1 tablet (40 mg total) by mouth daily. 90 tablet 3  . glucose blood (ACCU-CHEK AVIVA PLUS) test strip Test blood sugar three times a day.  DX E11.9 200 each 6  . Insulin Pen Needle 31G X 8 MM MISC To use with insulin 100 each 3  . LEVEMIR FLEXTOUCH 100 UNIT/ML Pen INJECT 31 UNITS INTO THE SKIN DAILY. 15 mL 6  . levocetirizine (XYZAL) 5 MG tablet Take 1 tablet (5 mg total) by mouth every evening.  90 tablet 2  . lovastatin (MEVACOR) 40 MG tablet Take 1 tablet (40 mg total) by mouth at bedtime. 90 tablet 0  . metoprolol succinate (TOPROL XL) 25 MG 24 hr tablet Take 1 tablet (25 mg total) by mouth daily. 30 tablet 2  . Multiple Vitamin (MULTIVITAMIN) tablet Take 1 tablet by mouth daily.    . tamsulosin (FLOMAX) 0.4 MG CAPS capsule TAKE 1 CAPSULE BY MOUTH EVERY DAY 90 capsule 3  . ferrous sulfate 325 (65 FE) MG tablet Take 325 mg by mouth daily with breakfast.    . Melatonin 5 MG TABS Take 5 mg by mouth at bedtime as needed (for sleep).     . traZODone (DESYREL) 50 MG tablet Take 0.5-1 tablets (25-50 mg total) by mouth at bedtime as needed for sleep. (Patient not taking: Reported on 01/23/2019) 30 tablet 3   No current facility-administered medications for this visit.     Allergies:   Patient has no known allergies.    ROS:  Please see the history of present illness.   Otherwise, review of systems are positive for none.   All other systems are reviewed and negative.    PHYSICAL EXAM: VS:  BP (!) 147/66   Pulse 79   Temp 97.7 F (36.5 C)   Ht 5\' 5"  (1.651 m)   Wt 158 lb (71.7 kg)   SpO2 97%   BMI 26.29 kg/m  , BMI Body mass index is 26.29 kg/m. GENERAL:  Well appearing NECK:  No jugular venous distention, waveform within normal limits, carotid upstroke brisk and symmetric, no bruits, no thyromegaly LUNGS:  Clear to auscultation bilaterally CHEST:  Well healed sternotomy scar. HEART:  PMI not displaced or sustained,S1 and S2 within normal limits, no S3, no S4, no clicks, no rubs, 2 out of 6 apical systolic murmur radiating slightly at the aortic outflow tract, no diastolic murmurs ABD:  Flat, positive bowel sounds normal in frequency in pitch, no bruits, no rebound, no guarding, no midline pulsatile mass, no hepatomegaly, no splenomegaly EXT:  2 plus pulses throughout, no edema, no cyanosis no clubbing   EKG:  EKG is  ordered today. The ekg ordered today demonstrates sinus  rhythm, rate 70, axis within normal limits, borderline interventricular conduction delay, premature ventricular contractions in a bigeminal pattern   Recent Labs: 02/01/2018: B Natriuretic Peptide 447.9 02/06/2018: Magnesium 1.8 03/11/2018: NT-Pro BNP 2,881 08/19/2018: Hemoglobin 13.4; Platelets 151 08/28/2018: ALT 26 01/17/2019: BUN 40; Creatinine, Ser 1.62; Potassium 4.8; Sodium 141    Lipid Panel    Component Value Date/Time   CHOL 187 08/28/2018 1033   CHOL 183 11/06/2017 0815   TRIG 121.0 08/28/2018 1033   HDL 54.00 08/28/2018 1033   HDL 58 11/06/2017 0815   CHOLHDL  3 08/28/2018 1033   VLDL 24.2 08/28/2018 1033   LDLCALC 109 (H) 08/28/2018 1033   LDLCALC 108 (H) 11/06/2017 0815      Wt Readings from Last 3 Encounters:  01/23/19 158 lb (71.7 kg)  01/17/19 156 lb (70.8 kg)  12/20/18 156 lb 6.4 oz (70.9 kg)      Other studies Reviewed: Additional studies/ records that were reviewed today include: echo. Review of the above records demonstrates:  Please see elsewhere in the note.     ASSESSMENT AND PLAN:  Severe AS s/p TAVR:  He has stable valve replacement as above.  He is not having any overt symptoms.  No change in follow-up.  Chronic combined S/D CHF:      His EF is moderately reduced but may be slightly better than previous.  He is tolerating his medications.  He cannot tolerate ACE or ARB.  He is tolerating low-dose beta-blocker which was just restarted now continue this.   CKD stage III:     Creat was most recently back down as above.    HTN:BP is mildly elevated.  In the future I will consider adding hydralazine nitrates based on future blood pressure readings.   CAD s/p CABG: Pre TAVR cath showed patent bypass grafts.  Bigeminy: He does not have a low blood pressure as noted.  He is not particularly perfusing his PVCs and certainly this is why his heart rate is under counted but I do not think he is having significant symptoms with this and so no further  change in therapy is indicated.  I had a conversation with his wife about this and she understands the reasoning for the low heart rate recordings on pulse ox.    Current medicines are reviewed at length with the patient today.  The patient does not have concerns regarding medicines.  The following changes have been made:  As above  Labs/ tests ordered today include:   None  Orders Placed This Encounter  Procedures  . EKG 12-Lead     Disposition:   FU with me in six months   Signed, [name], MD  01/23/2019 5:09 PM    [facility]  ADDENDUM:   Patient is 1 year s/p TAVR.  [facility] Cardiomyopathy Questionnaire  KCCQ-12 01/27/2019  1 a. Ability to shower/bathe Not at all limited  1 b. Ability to walk 1 block Slightly limited  1 c. Ability to hurry/jog Other, Did not do  2. Edema feet/ankles/legs Never over the past 2 weeks  3. Limited by fatigue At least once a day  4. Limited by dyspnea 3+ times a week, not every day  5. Sitting up / on 3+ pillows Never over the past 2 weeks  6. Limited enjoyment of life Slightly limited  7. Rest of life w/ symptoms Mostly satisfied  8 a. Participation in hobbies N/A, did not do for other reasons  8 b. Participation in chores Slightly limited  8 c. Visiting family/friends N/A, did not do for other reasons    He has NYHA class II symptoms.   Angelena Form PA-C  [facility]

## 2019-01-23 ENCOUNTER — Ambulatory Visit (INDEPENDENT_AMBULATORY_CARE_PROVIDER_SITE_OTHER): Payer: Medicare Other | Admitting: Cardiology

## 2019-01-23 ENCOUNTER — Encounter: Payer: Self-pay | Admitting: Cardiology

## 2019-01-23 VITALS — BP 147/66 | HR 79 | Temp 97.7°F | Ht 65.0 in | Wt 158.0 lb

## 2019-01-23 DIAGNOSIS — I10 Essential (primary) hypertension: Secondary | ICD-10-CM

## 2019-01-23 DIAGNOSIS — Z952 Presence of prosthetic heart valve: Secondary | ICD-10-CM | POA: Diagnosis not present

## 2019-01-23 DIAGNOSIS — I2510 Atherosclerotic heart disease of native coronary artery without angina pectoris: Secondary | ICD-10-CM | POA: Diagnosis not present

## 2019-01-23 DIAGNOSIS — I429 Cardiomyopathy, unspecified: Secondary | ICD-10-CM

## 2019-01-23 NOTE — Patient Instructions (Signed)
Medication Instructions:  Your physician recommends that you continue on your current medications as directed. Please refer to the Current Medication list given to you today.  *If you need a refill on your cardiac medications before your next appointment, please call your pharmacy*  Lab Work: NONE ordered at this time of appointment   If you have labs (blood work) drawn today and your tests are completely normal, you will receive your results only by: Marland Kitchen MyChart Message (if you have MyChart) OR . A paper copy in the mail If you have any lab test that is abnormal or we need to change your treatment, we will call you to review the results.  Testing/Procedures: NONE ordered at this time of appointment   Follow-Up: At [facility], you and your health needs are our priority.  As part of our continuing mission to provide you with exceptional heart care, we have created designated Provider Care Teams.  These Care Teams include your primary Cardiologist (physician) and Advanced Practice Providers (APPs -  Physician Assistants and Nurse Practitioners) who all work together to provide you with the care you need, when you need it.  Your next appointment:   6 months  The format for your next appointment:   In Person  Provider:   You may see [name], MD or one of the following Advanced Practice Providers on your designated Care Team:    Rosaria Ferries, PA-C  Jory Sims, DNP, ANP  Cadence Kathlen Mody, NP  Other Instructions

## 2019-02-03 ENCOUNTER — Other Ambulatory Visit: Payer: Self-pay

## 2019-02-03 ENCOUNTER — Inpatient Hospital Stay: Payer: Medicare Other | Attending: Hematology

## 2019-02-03 ENCOUNTER — Telehealth: Payer: Self-pay | Admitting: Hematology

## 2019-02-03 ENCOUNTER — Inpatient Hospital Stay (HOSPITAL_BASED_OUTPATIENT_CLINIC_OR_DEPARTMENT_OTHER): Payer: Medicare Other | Admitting: Hematology

## 2019-02-03 VITALS — BP 136/70 | HR 83 | Temp 98.2°F | Resp 18 | Ht 65.0 in | Wt 154.5 lb

## 2019-02-03 DIAGNOSIS — I2510 Atherosclerotic heart disease of native coronary artery without angina pectoris: Secondary | ICD-10-CM

## 2019-02-03 DIAGNOSIS — C3400 Malignant neoplasm of unspecified main bronchus: Secondary | ICD-10-CM | POA: Diagnosis not present

## 2019-02-03 DIAGNOSIS — C9110 Chronic lymphocytic leukemia of B-cell type not having achieved remission: Secondary | ICD-10-CM | POA: Diagnosis not present

## 2019-02-03 LAB — CMP (CANCER CENTER ONLY)
ALT: 35 U/L (ref 0–44)
AST: 37 U/L (ref 15–41)
Albumin: 3.9 g/dL (ref 3.5–5.0)
Alkaline Phosphatase: 112 U/L (ref 38–126)
Anion gap: 10 (ref 5–15)
BUN: 39 mg/dL — ABNORMAL HIGH (ref 8–23)
CO2: 28 mmol/L (ref 22–32)
Calcium: 9.4 mg/dL (ref 8.9–10.3)
Chloride: 103 mmol/L (ref 98–111)
Creatinine: 1.57 mg/dL — ABNORMAL HIGH (ref 0.61–1.24)
GFR, Est AFR Am: 45 mL/min — ABNORMAL LOW (ref >60)
GFR, Estimated: 39 mL/min — ABNORMAL LOW (ref >60)
Glucose, Bld: 137 mg/dL — ABNORMAL HIGH (ref 70–99)
Potassium: 4.7 mmol/L (ref 3.5–5.1)
Sodium: 141 mmol/L (ref 135–145)
Total Bilirubin: 0.5 mg/dL (ref 0.3–1.2)
Total Protein: 6.8 g/dL (ref 6.5–8.1)

## 2019-02-03 LAB — CBC WITH DIFFERENTIAL/PLATELET
Abs Immature Granulocytes: 0.03 10*3/uL (ref 0.00–0.07)
Basophils Absolute: 0 10*3/uL (ref 0.0–0.1)
Basophils Relative: 1 %
Eosinophils Absolute: 0.1 10*3/uL (ref 0.0–0.5)
Eosinophils Relative: 1 %
HCT: 42.7 % (ref 39.0–52.0)
Hemoglobin: 14 g/dL (ref 13.0–17.0)
Immature Granulocytes: 0 %
Lymphocytes Relative: 31 %
Lymphs Abs: 2.6 10*3/uL (ref 0.7–4.0)
MCH: 30.2 pg (ref 26.0–34.0)
MCHC: 32.8 g/dL (ref 30.0–36.0)
MCV: 92.2 fL (ref 80.0–100.0)
Monocytes Absolute: 0.8 10*3/uL (ref 0.1–1.0)
Monocytes Relative: 9 %
Neutro Abs: 4.9 10*3/uL (ref 1.7–7.7)
Neutrophils Relative %: 58 %
Platelets: 170 10*3/uL (ref 150–400)
RBC: 4.63 MIL/uL (ref 4.22–5.81)
RDW: 14.3 % (ref 11.5–15.5)
WBC: 8.3 10*3/uL (ref 4.0–10.5)
nRBC: 0 % (ref 0.0–0.2)

## 2019-02-03 LAB — LACTATE DEHYDROGENASE: LDH: 323 U/L — ABNORMAL HIGH (ref 98–192)

## 2019-02-03 NOTE — Progress Notes (Signed)
HEMATOLOGY/ONCOLOGY CLINIC NOTE  Date of Service: 02/03/2019  Patient Care Team: Shelda Pal, DO as PCP - General (Family Medicine) [name], MD as PCP - Cardiology (Cardiology)  CHIEF COMPLAINTS/PURPOSE OF CONSULTATION:  History of CLL and NSCLC  Oncologic History:   Austin Lloyd was diagnosed with CLL in 2012 and was treated with one cycle on Bendamustine and possibly Rituxan (incomplete records) before developing acute kidney injury and then completing 5 cycles of Gazyva and Chlorambucil.   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 Treated from 06/13/16 to 06/23/16 with stereotactic body radiotherapy to right upper lobe, total dose of 5000cGy in 5 fractions   Bladder cancer prior to 2010. Low grade prostate cancer currently.   HISTORY OF PRESENTING ILLNESS:   Austin Lloyd is a wonderful 85 y.o. male who has been referred to [place] by Dr. Riki Sheer for evaluation and management of history of CLL and non-small cell lung cancer. He is accompanied today by his wife. The pt reports that he is doing well overall and has enjoyed his recent move from [place].   The pt notes that he has had stable energy levels. He notes that he has not had any changes in breathing and has not developed any constitutional symptom, denying fevers, chills, night sweats and unexpected weight loss. The pt notes that he has not been able to walk as far as he used to and has to sit down often.   The pt reports that his elevated WBC was incidentally picked up while living in [place] in 2012. The pt notes that his treatment with Bendamustine, and possibly Rituxan, was complicated by CHF and acute kidney injury and he notes that he had two infusions at the  time.   The pt notes that his lung cancer was initially diagnosed in 2015 and was resected. It then recurred in 2018 and was treated with radiation.   The pt has had three squamous cell carcinomas, one under left eye, one under left lear, and on occipital scalp. He also has had a basal cell carcinoma. He will be establishing care with dermatology in October.  The pt notes that his bladder cancer and prostate cancer has received BCG and denies radiation to his prostate. He has begun care with urology Dr. Louis Meckel.   He has just begun seeing Dr. [name] in Cardiology as well for his history of CHF, CAD, and aortic stenosis. The pt had an MI in 2015 with subsequent CABG x2.   Most recent lab results (11/06/17) of CBC and CMP is as follows: all values are WNL except for HGB at 12.9, RDW at 15.6, BUN at 33, Creatinine at 1.47, GFR at 43.  On review of systems, pt reports stable energy levels, and denies fevers, chills, night sweats, unexpected weight loss, noticing any new lumps or bumps, changes in breathing, blood in the urine, abdominal pains, chest pain, and any other symptoms.   On PMHx the pt reports DM type II, CKD stage III, CAD s/p two vessel CABG, CLL, invasive adenocarcinoma. 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 Basal cell carcinoma, high grade bladder cancer, low grade prostate cancer, MI in June 2015, acute decompensated heart failure. On Social Hx the pt reports quit smoking cigarettes more than  45 years ago  Interval History:   Austin Lloyd returns today for management and evaluation of his CLL and history of NSCLC. The patient's last visit with [place] was on 08/19/2018. The pt reports that he is doing well overall.   The pt reports that he has been well in the interim and has been taking safety precautions. To help with the isolation he walks 1-2 miles everyday. He denies any major medication changes. Pt has continued to see Dr.  Louis Meckel for prostate and bladder cancer management and states that it is stable at this time. He continues to wear compression socks.   Of note since the patient's last visit, pt has had CT Chest ([id number]) completed on 01/20/2019 with results revealing "1. No significant change in partially calcified right suprahilar mass consistent with treated lung cancer. 2. No evidence of local recurrence or metastatic disease. 3. Stable small right upper lobe nodules. The nodular left lower lobe airspace disease has resolved. 4. Diffuse atherosclerosis status post CABG and aortic valve replacement. Aortic Atherosclerosis (ICD10-I70.0). 5. Cholelithiasis."  Lab results today (02/03/19) of CBC w/diff and CMP is as follows: all values are WNL except for Glucose at 137, BUN at 39, Creatinine at 1.57, GFR Est Non Af Am at 39. 02/03/2019 LDH at 323  On review of systems, pt denies any other symptoms.   MEDICAL HISTORY:  Past Medical History:  Diagnosis Date   Aortic stenosis    Bladder cancer ([place])    T1 status post BCG   Chronic lymphocytic leukemia (HCC)    The cell prolymphocytic leukemia without chromosomal abnormalities   Coronary artery disease with history of myocardial infarction without history of CABG    LIMA to the LAD, SVG to RCA 2016 for treatment of the percent left main stenosis and 80% RCA stenosis.     Depression    Diabetes mellitus, type II ([place])    Hypertension    Ischemic cardiomyopathy    EF 40% 2019   Lung cancer ([place])    Adenocarcinoma of the right lung moderately differentiated   Lung mass    PVC's (premature ventricular contractions)    S/P TAVR (transcatheter aortic valve replacement)     SURGICAL HISTORY: Past Surgical History:  Procedure Laterality Date   CORONARY ARTERY BYPASS GRAFT     2014   INGUINAL HERNIA REPAIR     INTRAOPERATIVE TRANSTHORACIC ECHOCARDIOGRAM  02/05/2018   Procedure: INTRAOPERATIVE TRANSTHORACIC ECHOCARDIOGRAM;   Surgeon: [name], MD;  Location: [facility];  Service: Open Heart Surgery;;   LUNG REMOVAL, PARTIAL     RIGHT/LEFT HEART CATH AND CORONARY/GRAFT ANGIOGRAPHY N/A 12/21/2017   Procedure: RIGHT/LEFT HEART CATH AND CORONARY/GRAFT ANGIOGRAPHY;  Surgeon: [name], MD;  Location: [facility];  Service: Cardiovascular;  Laterality: N/A;   TRANSCATHETER AORTIC VALVE REPLACEMENT, TRANSFEMORAL  02/05/2018   TRANSCATHETER AORTIC VALVE REPLACEMENT, TRANSFEMORAL (   TRANSCATHETER AORTIC VALVE REPLACEMENT, TRANSFEMORAL N/A 02/05/2018   Procedure: TRANSCATHETER AORTIC VALVE REPLACEMENT, TRANSFEMORAL;  Surgeon: [name], MD;  Location: [facility];  Service: Open Heart Surgery;  Laterality: N/A;   Ulcer surgery      SOCIAL HISTORY: Social History   Socioeconomic History   Marital status: Married    Spouse name: Not on file   Number of children: 3   Years of education: Not on file   Highest education level: Not on file  Occupational History   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   Drug use: Never   Sexual activity: Not on file  Lifestyle   Physical activity    Days per week: Not on file    Minutes per session: Not on file   Stress: Not on file  Relationships   Social connections    Talks on phone: Not on file    Gets together: Not on file    Attends religious service: Not on file    Active member  of club or organization: Not on file    Attends meetings of clubs or organizations: Not on file    Relationship status: Not on file   Intimate partner violence    Fear of current or ex partner: Not on file    Emotionally abused: Not on file    Physically abused: Not on file    Forced sexual activity: Not on file  Other Topics Concern   Not on file  Social History Narrative   Lives with wife daughter.  Three children and one grandchild.     FAMILY HISTORY: Family History  Problem Relation Age of Onset   AAA (abdominal aortic aneurysm) Mother    Cancer Father        Brain tumor    ALLERGIES:  has No Known Allergies.  MEDICATIONS:  Current Outpatient Medications  Medication Sig Dispense Refill   ACCU-CHEK SOFTCLIX LANCETS lancets Test blood sugar three times a day.  DX E11.9 200 each 6   aspirin EC 81 MG tablet Take 81 mg by mouth every evening.      ferrous sulfate 325 (65 FE) MG tablet Take 325 mg by mouth daily with breakfast.     furosemide (LASIX) 40 MG tablet Take 1 tablet (40 mg total) by mouth daily. 90 tablet 3   glucose blood (ACCU-CHEK AVIVA PLUS) test strip Test blood sugar three times a day.  DX E11.9 200 each 6   Insulin Pen Needle 31G X 8 MM MISC To use with insulin 100 each 3   LEVEMIR FLEXTOUCH 100 UNIT/ML Pen INJECT 31 UNITS INTO THE SKIN DAILY. 15 mL 6   levocetirizine (XYZAL) 5 MG tablet Take 1 tablet (5 mg total) by mouth every evening. 90 tablet 2   lovastatin (MEVACOR) 40 MG tablet Take 1 tablet (40 mg total) by mouth at bedtime. 90 tablet 0   Melatonin 5 MG TABS Take 5 mg by mouth at bedtime as needed (for sleep).      metoprolol succinate (TOPROL XL) 25 MG 24 hr tablet Take 1 tablet (25 mg total) by mouth daily. 30 tablet 2   Multiple Vitamin (MULTIVITAMIN) tablet Take 1 tablet by mouth daily.     tamsulosin (FLOMAX) 0.4 MG CAPS capsule TAKE 1 CAPSULE BY MOUTH EVERY DAY 90 capsule 3   traZODone (DESYREL) 50 MG tablet Take  0.5-1 tablets  (25-50 mg total) by mouth at bedtime as needed for sleep. (Patient not taking: Reported on 01/23/2019) 30 tablet 3   No current facility-administered medications for this visit.     REVIEW OF SYSTEMS:   A 10+ POINT REVIEW OF SYSTEMS WAS OBTAINED including neurology, dermatology, psychiatry, cardiac, respiratory, lymph, extremities, GI, GU, Musculoskeletal, constitutional, breasts, reproductive, HEENT.  All pertinent positives are noted in the HPI.  All others are negative.   PHYSICAL EXAMINATION: ECOG PERFORMANCE STATUS: 2 - Symptomatic, <50% confined to bed  Vitals:   02/03/19 1312  BP: 136/70  Pulse: 83  Resp: 18  Temp: 98.2 F (36.8 C)  SpO2: 95%   Filed Weights   02/03/19 1312  Weight: 154 lb 8 oz (70.1 kg)   .Body mass index is 25.71 kg/m.  Exam was given in a chair   GENERAL:alert, in no acute distress and comfortable SKIN: no acute rashes, no significant lesions EYES: conjunctiva are pink and non-injected, sclera anicteric OROPHARYNX: MMM, no exudates, no oropharyngeal erythema or ulceration NECK: supple, no JVD LYMPH:  no palpable lymphadenopathy in the cervical, axillary or inguinal regions LUNGS: clear to auscultation b/l with normal respiratory effort HEART: regular rate & rhythm ,3/6 SM aorticarea ABDOMEN:  normoactive bowel sounds , non tender, not distended. No palpable hepatosplenomegaly.  Extremity: no pedal edema PSYCH: alert & oriented x 3 with fluent speech NEURO: no focal motor/sensory deficits  LABORATORY DATA:  I have reviewed the data as listed  . CBC Latest Ref Rng & Units 02/03/2019 08/19/2018 03/27/2018  WBC 4.0 - 10.5 K/uL 8.3 7.0 9.0  Hemoglobin 13.0 - 17.0 g/dL 14.0 13.4 12.3(L)  Hematocrit 39.0 - 52.0 % 42.7 41.7 39.1  Platelets 150 - 400 K/uL 170 151 204   . CBC    Component Value Date/Time   WBC 8.3 02/03/2019 1142   RBC 4.63 02/03/2019 1142   HGB 14.0 02/03/2019 1142   HGB 13.1 12/17/2017 1306   HCT 42.7 02/03/2019 1142   HCT  39.4 12/17/2017 1306   PLT 170 02/03/2019 1142   PLT 206 12/17/2017 1306   MCV 92.2 02/03/2019 1142   MCV 88 12/17/2017 1306   MCH 30.2 02/03/2019 1142   MCHC 32.8 02/03/2019 1142   RDW 14.3 02/03/2019 1142   RDW 15.0 12/17/2017 1306   LYMPHSABS 2.6 02/03/2019 1142   MONOABS 0.8 02/03/2019 1142   EOSABS 0.1 02/03/2019 1142   BASOSABS 0.0 02/03/2019 1142    . CMP Latest Ref Rng & Units 02/03/2019 01/17/2019 01/06/2019  Glucose 70 - 99 mg/dL 137(H) 158(H) 134(H)  BUN 8 - 23 mg/dL 39(H) 40(H) 46(H)  Creatinine 0.61 - 1.24 mg/dL 1.57(H) 1.62(H) 1.85(H)  Sodium 135 - 145 mmol/L 141 141 142  Potassium 3.5 - 5.1 mmol/L 4.7 4.8 5.6(H)  Chloride 98 - 111 mmol/L 103 104 104  CO2 22 - 32 mmol/L 28 19(L) 26  Calcium 8.9 - 10.3 mg/dL 9.4 9.3 9.5  Total Protein 6.5 - 8.1 g/dL 6.8 - -  Total Bilirubin 0.3 - 1.2 mg/dL 0.5 - -  Alkaline Phos 38 - 126 U/L 112 - -  AST 15 - 41 U/L 37 - -  ALT 0 - 44 U/L 35 - -     RADIOGRAPHIC STUDIES: I have personally reviewed the radiological images as listed and agreed with the findings in the report. Ct Chest Wo Contrast  Result Date: 01/20/2019 CLINICAL DATA:  Non-small-cell lung cancer diagnosed in 2015. Radiation therapy completed in 2017  per patient. EXAM: CT CHEST WITHOUT CONTRAST TECHNIQUE: Multidetector CT imaging of the chest was performed following the standard protocol without IV contrast. COMPARISON:  Chest CT 06/18/2018 and 03/22/2018. PET-CT 01/18/2018. FINDINGS: Cardiovascular: There is diffuse atherosclerosis of the aorta, great vessels and coronary arteries status post median sternotomy, CABG and aortic valve replacement. There is stable mild cardiomegaly. No significant pericardial effusion. Mediastinum/Nodes: Small calcified right hilar lymph nodes are stable. There are no enlarged mediastinal, hilar or axillary lymph nodes. Stable small hiatal hernia. The thyroid gland and trachea appear unremarkable. Lungs/Pleura: There is no pleural  effusion or pneumothorax. The partially calcified right suprahilar mass chronically occludes the anterior segment of the right upper lobe bronchus, but is similar in appearance to the previous studies, measuring up to 4.2 x 3.1 cm on image 60/7. There are small adjacent fiducial markers. Stable solid 5 mm right upper lobe nodule on image 35/7 and ill-defined right upper lobe ground-glass density on image 30/7. The nodular airspace disease in the left lower lobe on the most recent study has resolved. No new or enlarging pulmonary nodules. Upper abdomen: The visualized upper abdomen appears stable without acute findings. There is a large calcified gallstone. Postsurgical changes are present in the proximal stomach. Musculoskeletal/Chest wall: There is no chest wall mass or suspicious osseous finding. IMPRESSION: 1. No significant change in partially calcified right suprahilar mass consistent with treated lung cancer. 2. No evidence of local recurrence or metastatic disease. 3. Stable small right upper lobe nodules. The nodular left lower lobe airspace disease has resolved. 4. Diffuse atherosclerosis status post CABG and aortic valve replacement. Aortic Atherosclerosis (ICD10-I70.0). 5. Cholelithiasis. Electronically Signed   By: Richardean Sale M.D.   On: 01/20/2019 11:42     08/04/17 CT Chest:    ASSESSMENT & PLAN:  85 y.o. male with  1. History of CLL 03/2013 treated with Bendamustine and possibly Rituxan (outside records incomplete) but discontinued after 1 dose due to acute kidney injury 04/2013 to 08/2013 treated with 5 cycles of Gazyva and Chlorambucil  04/2015 FISH was without chromosomal abnormalities   2. History of Lung Cancer Invasive adenocarcinoma of the right upper lung in 2015, moderately differentiated, initially Stage IB with visceral pleural invasion but resected with clear margins and no lymph node involvement 04/2016 Biopsy confirmed right upper lobe recurrence of non-small cell  cancer. Treated with stereotactic body radiotherapy to right upper lobe , total dose of 5000cGy in 5 fractions  01/2017 New right lower lobe nodule initially concerning for recurrence, but then resolved spontaneously  03/22/18 CT Chest revealed "The partially calcified right perihilar mass is stable from available priors dating back to 01/09/2018. Based on the stability and lack of hypermetabolic activity on PET-CT, this is likely postinflammatory or treated tumor. Continued CT follow-up (3-6 months) recommended. 2. New patchy left lower lobe airspace disease, likely inflammatory and potentially early bronchopneumonia. Radiographic follow up recommended. 3. Stable right upper lobe solid and ground-glass opacities can be addressed on subsequent follow-up. 4. Coronary and Aortic Atherosclerosis. Previous T AVR."  06/18/18 CT Chest revealed "Stable RIGHT suprahilar mass most consists with post the radiation / surgical therapy change. 2. Waxing waning airspace disease/nodularity in the LEFT lower lobe with increased nodularity. Findings atypical for carcinoma. Consider aspiration pneumonia. Recommend follow-up CT exam in 1-3 months. 3. Stable ground-glass nodules in the upper lobe are indeterminate. Recommend attention on routine oncology surveillance."  3. Prostate cancer low grade Has maintained surveillance with urology and every 6 month PSA checks -on  lupron with urology  4. History of high grade bladder cancer Continue follow up with urology  5. History of Squamous cell carcinoma x3 and Basal cell carcinoma -continue f/u with dermatology  6. CKD Stage III - stable . Continue f/u with PCP  7. H/o CAD, CHF, with mod-severe Aortic Stenosis.modMR -continue f/u with cardiology for continued Mx.   8. Bronchopneumonia- seen on 03/22/18 CT Chest, with clinical correlation on 03/27/18  PLAN: -Discussed pt labwork today, 02/03/19; all values are WNL except for Glucose at 137, BUN at 39, Creatinine at 1.57,  GFR Est Non Af Am at 39. -Discussed 02/03/2019 LDH is high at 323 but has not regularly correlated with his CLL  -The pt shows no clinical or lab progression of his CLL at this time.  -No indication for further treatment of lung CA at this time.  -Continue follow up with urologist Dr. Louis Meckel for prostate and bladder cancer management  -Continue follow up with Dr. [name] in cardiology -Will see back in 6 months with labs    Orders Placed This Encounter  Procedures   CBC with Differential/Platelet    Standing Status:   Future    Standing Expiration Date:   03/09/2020   CMP ([facility] only)    Standing Status:   Future    Standing Expiration Date:   02/03/2020   Lactate dehydrogenase    Standing Status:   Future    Standing Expiration Date:   02/03/2020    FOLLOW UP: RTC with Dr Irene Limbo with labs in 6 months  The total time spent in the appt was 15 minutes and more than 50% was on counseling and direct patient cares.  All of the patient's questions were answered with apparent satisfaction. The patient knows to call the clinic with any problems, questions or concerns.    Sullivan Lone MD [place] AAHIVMS [facility] Hematology/Oncology Physician [facility]  (Office):       [phone] (Work cell):  [phone] (Fax):           [phone]  02/03/2019 5:08 PM  I, Yevette Edwards, am acting as a scribe for Dr. Sullivan Lone.   .I have reviewed the above documentation for accuracy and completeness, and I agree with the above. Brunetta Genera MD

## 2019-02-03 NOTE — Telephone Encounter (Signed)
Scheduled appt per 11/16 los. ° °Printed calendar and avs. °

## 2019-02-24 ENCOUNTER — Other Ambulatory Visit: Payer: Self-pay | Admitting: Physician Assistant

## 2019-02-28 ENCOUNTER — Telehealth: Payer: Self-pay

## 2019-02-28 ENCOUNTER — Ambulatory Visit: Payer: Medicare Other | Admitting: Family Medicine

## 2019-02-28 NOTE — Telephone Encounter (Signed)
OK to place referral. Ty.  

## 2019-02-28 NOTE — Telephone Encounter (Signed)
Copied from [facility] [id number]. Topic: General - Other >> Feb 28, 2019  1:29 PM Celene Kras wrote: Reason for CRM: Pts wife called and is requesting to have a referral sent to Dr. Renato Shin for endocrinology. Pts wife states pt is going to sugar shock because his sugar is too low. Please advise.   [phone]

## 2019-03-03 ENCOUNTER — Other Ambulatory Visit: Payer: Self-pay | Admitting: Family Medicine

## 2019-03-03 DIAGNOSIS — L602 Onychogryphosis: Secondary | ICD-10-CM | POA: Diagnosis not present

## 2019-03-03 DIAGNOSIS — E119 Type 2 diabetes mellitus without complications: Secondary | ICD-10-CM

## 2019-03-03 DIAGNOSIS — Z794 Long term (current) use of insulin: Secondary | ICD-10-CM

## 2019-03-03 DIAGNOSIS — E1159 Type 2 diabetes mellitus with other circulatory complications: Secondary | ICD-10-CM | POA: Diagnosis not present

## 2019-03-03 DIAGNOSIS — L84 Corns and callosities: Secondary | ICD-10-CM | POA: Diagnosis not present

## 2019-03-03 NOTE — Telephone Encounter (Signed)
done

## 2019-03-05 ENCOUNTER — Ambulatory Visit: Payer: Medicare Other | Admitting: Family Medicine

## 2019-03-10 DIAGNOSIS — C61 Malignant neoplasm of prostate: Secondary | ICD-10-CM | POA: Diagnosis not present

## 2019-03-11 ENCOUNTER — Other Ambulatory Visit: Payer: Self-pay

## 2019-03-11 ENCOUNTER — Ambulatory Visit: Payer: Medicare Other | Admitting: Family Medicine

## 2019-03-11 ENCOUNTER — Ambulatory Visit (INDEPENDENT_AMBULATORY_CARE_PROVIDER_SITE_OTHER): Payer: Medicare Other | Admitting: Family Medicine

## 2019-03-11 ENCOUNTER — Encounter: Payer: Self-pay | Admitting: Family Medicine

## 2019-03-11 VITALS — BP 110/72 | HR 74 | Temp 98.0°F | Ht 64.0 in | Wt 157.4 lb

## 2019-03-11 DIAGNOSIS — E119 Type 2 diabetes mellitus without complications: Secondary | ICD-10-CM | POA: Diagnosis not present

## 2019-03-11 DIAGNOSIS — Z794 Long term (current) use of insulin: Secondary | ICD-10-CM | POA: Diagnosis not present

## 2019-03-11 LAB — HEMOGLOBIN A1C: Hgb A1c MFr Bld: 6.2 % (ref 4.6–6.5)

## 2019-03-11 LAB — MICROALBUMIN / CREATININE URINE RATIO
Creatinine,U: 60.3 mg/dL
Microalb Creat Ratio: 4.6 mg/g (ref 0.0–30.0)
Microalb, Ur: 2.8 mg/dL — ABNORMAL HIGH (ref 0.0–1.9)

## 2019-03-11 NOTE — Patient Instructions (Addendum)
Give [place] 2-3 business days to get the results of your labs back.   Keep the diet clean and stay active.  Let's dial your insulin down to 28 units daily. I would rather your sugar be too high than too low.   Let us know if you need anything.

## 2019-03-11 NOTE — Progress Notes (Signed)
Subjective:   Chief Complaint  Patient presents with  . Follow-up    Austin Lloyd is a 85 y.o. male here for follow-up of diabetes.   Austin Lloyd has been having some low sugars. Does not mind his diet.  Patient does require insulin. He is on 31 units of Levemir daily.   Medications include: insulin only Exercise: Walking daily  Past Medical History:  Diagnosis Date  . Aortic stenosis   . Bladder cancer ([place])    T1 status post BCG  . 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 Coronary artery disease with history of myocardial infarction without history of CABG    LIMA to the LAD, SVG to RCA 2016 for treatment of the percent left main stenosis and 80% RCA stenosis.    . Depression   . Diabetes mellitus, type II ([place])   . Hypertension   . Ischemic cardiomyopathy    EF 40% 2019  . Lung cancer ([place])    Adenocarcinoma of the right lung moderately differentiated  . PVC's (premature ventricular contractions)   . S/P TAVR (transcatheter aortic valve replacement)      Related testing: Date of retinal exam: Due Pneumovax: done Flu Shot: done  Review of Systems: Pulmonary:  No SOB Cardiovascular:  No chest pain  Objective:  BP 110/72 (BP Location: Left Arm, Patient Position: Sitting, Cuff Size: Normal)   Pulse 74   Temp 98 F (36.7 C) (Temporal)   Ht 5\' 4"  (1.626 m)   Wt 157 lb 6 oz (71.4 kg)   SpO2 93%   BMI 27.01 kg/m  General:  Well developed, well nourished, in no apparent distress Eyes:  Pupils equal and round, sclera anicteric without injection  Lungs:  CTAB, no access msc use Cardio:  RRR, no bruits, no LE edema Psych: Age appropriate judgment and insight  Assessment:   Type 2 diabetes mellitus without complication, with long-term current use of insulin (HCC) - Plan: HgB A1c, Urine Microalbumin w/creat. ratio   Plan:   Decrease Levemir to 28 units/d from 31. Has appt w endo next mo.  Counseled  on diet and exercise. F/u in 6 mo. The patient voiced understanding and agreement to the plan.  Bern, DO 03/11/19 1:26 PM

## 2019-03-26 DIAGNOSIS — D485 Neoplasm of uncertain behavior of skin: Secondary | ICD-10-CM | POA: Diagnosis not present

## 2019-03-26 DIAGNOSIS — L570 Actinic keratosis: Secondary | ICD-10-CM | POA: Diagnosis not present

## 2019-03-26 DIAGNOSIS — C4441 Basal cell carcinoma of skin of scalp and neck: Secondary | ICD-10-CM | POA: Diagnosis not present

## 2019-03-26 DIAGNOSIS — Z85828 Personal history of other malignant neoplasm of skin: Secondary | ICD-10-CM | POA: Diagnosis not present

## 2019-03-26 DIAGNOSIS — L821 Other seborrheic keratosis: Secondary | ICD-10-CM | POA: Diagnosis not present

## 2019-03-26 DIAGNOSIS — D1801 Hemangioma of skin and subcutaneous tissue: Secondary | ICD-10-CM | POA: Diagnosis not present

## 2019-04-01 ENCOUNTER — Other Ambulatory Visit: Payer: Self-pay | Admitting: Family Medicine

## 2019-04-01 ENCOUNTER — Encounter: Payer: Self-pay | Admitting: Family Medicine

## 2019-04-01 ENCOUNTER — Other Ambulatory Visit: Payer: Self-pay

## 2019-04-01 MED ORDER — INSULIN PEN NEEDLE 31G X 8 MM MISC: 3 refills | Status: DC

## 2019-04-01 NOTE — Telephone Encounter (Signed)
Medication Refill - Medication:  BD Short Pen needles-Pt is out of pen needles  Has the patient contacted their pharmacy? Yes.   (Agent: If no, request that the patient contact the pharmacy for the refill.) (Agent: If yes, when and what did the pharmacy advise?)  Preferred Pharmacy (with phone number or street name):  [id number] - [place], [place]. [address] Phone:  [phone]  Fax:  [phone]      Agent: Please be advised that RX refills may take up to 3 business days. We ask that you follow-up with your pharmacy.

## 2019-04-03 ENCOUNTER — Encounter: Payer: Self-pay | Admitting: Endocrinology

## 2019-04-03 ENCOUNTER — Ambulatory Visit (INDEPENDENT_AMBULATORY_CARE_PROVIDER_SITE_OTHER): Payer: Medicare Other | Admitting: Endocrinology

## 2019-04-03 VITALS — BP 140/82 | HR 86 | Ht 64.0 in | Wt 158.0 lb

## 2019-04-03 DIAGNOSIS — N1831 Chronic kidney disease, stage 3a: Secondary | ICD-10-CM | POA: Diagnosis not present

## 2019-04-03 DIAGNOSIS — E119 Type 2 diabetes mellitus without complications: Secondary | ICD-10-CM

## 2019-04-03 DIAGNOSIS — Z794 Long term (current) use of insulin: Secondary | ICD-10-CM

## 2019-04-03 DIAGNOSIS — E1122 Type 2 diabetes mellitus with diabetic chronic kidney disease: Secondary | ICD-10-CM

## 2019-04-03 DIAGNOSIS — E1121 Type 2 diabetes mellitus with diabetic nephropathy: Secondary | ICD-10-CM

## 2019-04-03 NOTE — Patient Instructions (Addendum)
good diet and exercise significantly improve the control of your diabetes.  please let me know if you wish to be referred to a dietician.  high blood sugar is very risky to your health.  you should see an eye doctor and dentist every year.  It is very important to get all recommended vaccinations.  Controlling your blood pressure and cholesterol drastically reduces the damage diabetes does to your body.  Those who smoke should quit.  Please discuss these with your doctor.   check your blood sugar twice a day.  vary the time of day when you check, between before the 3 meals, and at bedtime.  also check if you have symptoms of your blood sugar being too high or too low.  please keep a record of the readings and bring it to your next appointment here (or you can bring the meter itself).  You can write it on any piece of paper.  please call us sooner if your blood sugar goes below 70, or if you have a lot of readings over 200.   Please continue the same Levemir for now.   Please come back for a follow-up appointment in 6 weeks.   You have the option of adding a different non-insulin medication, and reducing the insulin.  We'll address this later.

## 2019-04-03 NOTE — Progress Notes (Signed)
Subjective:    Patient ID: Austin Lloyd, male    DOB: December 07, 1929, 86 y.o.   MRN: [id number]  HPI pt is referred by Dr [name], for diabetes.  Pt states DM was dx'ed in 8841; it is complicated by CAD and renal insuff; he has been on insulin since soon after dx; pt says his diet is not good, but exercise is good; he has never had pancreatitis, pancreatic surgery, he has had several episodes of severe hypoglycemia (last was approx 2015);  He has never had DKA.  He denies hypoglycemia since levemir was decreased to 28 units qam, 3 weeks ago.   Past Medical History:  Diagnosis Date  . Aortic stenosis   . Bladder cancer ([place])    T1 status post BCG  . 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 Coronary artery disease with history of myocardial infarction without history of CABG    LIMA to the LAD, SVG to RCA 2016 for treatment of the percent left main stenosis and 80% RCA stenosis.    . Depression   . Diabetes mellitus, type II ([place])   . Hypertension   . Ischemic cardiomyopathy    EF 40% 2019  . Lung cancer ([place])    Adenocarcinoma of the right lung moderately differentiated  . PVC's (premature ventricular contractions)   . S/P TAVR (transcatheter aortic valve replacement)     Past Surgical History:  Procedure Laterality Date  . CORONARY ARTERY BYPASS GRAFT     2014  . INGUINAL HERNIA REPAIR    . INTRAOPERATIVE TRANSTHORACIC ECHOCARDIOGRAM  02/05/2018   Procedure: INTRAOPERATIVE TRANSTHORACIC ECHOCARDIOGRAM;  Surgeon: [name], MD;  Location: [facility] OR;  Service: Open Heart Surgery;;  . LUNG REMOVAL, PARTIAL    . RIGHT/LEFT HEART CATH AND CORONARY/GRAFT ANGIOGRAPHY N/A 12/21/2017   Procedure: RIGHT/LEFT HEART CATH AND CORONARY/GRAFT ANGIOGRAPHY;  Surgeon: [name], MD;  Location: [place] [facility];  Service: Cardiovascular;  Laterality: N/A;  . TRANSCATHETER AORTIC VALVE REPLACEMENT,  TRANSFEMORAL  02/05/2018   TRANSCATHETER AORTIC VALVE REPLACEMENT, TRANSFEMORAL (  . TRANSCATHETER AORTIC VALVE REPLACEMENT, TRANSFEMORAL N/A 02/05/2018   Procedure: TRANSCATHETER AORTIC VALVE REPLACEMENT, TRANSFEMORAL;  Surgeon: [name], MD;  Location: [place];  Service: Open Heart Surgery;  Laterality: N/A;  . Ulcer surgery      Social History   Socioeconomic History  . Marital status: Married    Spouse name: Not on file  . Number of children: 3  . Years of education: Not on file  . Highest education level: Not on file  Occupational History  . 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 Smoking status: Former Smoker    Packs/day: 0.20    Years: 30.00    Pack years: 6.00    Types: Cigarettes  . Smokeless tobacco: Never Used  Substance and Sexual Activity  . Alcohol use: Yes    Alcohol/week: 2.0 standard drinks    Types: 2 Cans of beer per week    Comment: 2 cans beer per week.  . Drug use: Never  . Sexual activity: Not on file  Other Topics Concern  . Not on file  Social History Narrative   Lives with wife daughter.  Three children and one grandchild.    Social Determinants of Health   Financial Resource Strain:   . Difficulty of Paying Living Expenses: Not on file  Food Insecurity:   .  Worried About Charity fundraiser in the Last Year: Not on file  . Ran Out of Food in the Last Year: Not on file  Transportation Needs:   . Lack of Transportation (Medical): Not on file  . Lack of Transportation (Non-Medical): Not on file  Physical Activity:   . Days of Exercise per Week: Not on file  . Minutes of Exercise per Session: Not on file  Stress:   . Feeling of Stress : Not on file  Social Connections:   . Frequency of Communication with Friends and Family: Not on file  . Frequency of Social Gatherings with Friends and Family: Not on file  . Attends Religious Services: Not on file  . Active Member of Clubs or  Organizations: Not on file  . Attends Archivist Meetings: Not on file  . Marital Status: Not on file  Intimate Partner Violence:   . Fear of Current or Ex-Partner: Not on file  . Emotionally Abused: Not on file  . Physically Abused: Not on file  . Sexually Abused: Not on file    Current Outpatient Medications on File Prior to Visit  Medication Sig Dispense Refill  . ACCU-CHEK SOFTCLIX LANCETS lancets Test blood sugar three times a day.  DX E11.9 (Patient taking differently: Check qd.  DX E11.9) 200 each 6  . aspirin EC 81 MG tablet Take 81 mg by mouth every evening.     . ferrous sulfate 325 (65 FE) MG tablet Take 325 mg by mouth daily with breakfast.    . furosemide (LASIX) 40 MG tablet TAKE 1 TABLET BY MOUTH EVERY DAY 90 tablet 3  . glucose blood (ACCU-CHEK AVIVA PLUS) test strip Test blood sugar three times a day.  DX E11.9 (Patient taking differently: Check qd.  DX E11.9) 200 each 6  . Insulin Detemir (LEVEMIR FLEXTOUCH) 100 UNIT/ML Pen INJECT 28 UNITS INTO THE SKIN DAILY. 15 mL 6  . Insulin Pen Needle 31G X 8 MM MISC To use with insulin 100 each 3  . levocetirizine (XYZAL) 5 MG tablet Take 1 tablet (5 mg total) by mouth every evening. 90 tablet 2  . losartan (COZAAR) 25 MG tablet Take 25 mg by mouth daily.    Marland Kitchen lovastatin (MEVACOR) 40 MG tablet Take 1 tablet (40 mg total) by mouth at bedtime. 90 tablet 0  . metoprolol succinate (TOPROL XL) 25 MG 24 hr tablet Take 1 tablet (25 mg total) by mouth daily. 30 tablet 2  . Multiple Vitamin (MULTIVITAMIN) tablet Take 1 tablet by mouth daily.    . tamsulosin (FLOMAX) 0.4 MG CAPS capsule TAKE 1 CAPSULE BY MOUTH EVERY DAY 90 capsule 3   No current facility-administered medications on file prior to visit.    No Known Allergies  Family History  Problem Relation Age of Onset  . AAA (abdominal aortic aneurysm) Mother   . Cancer Father        Brain tumor  . Diabetes Neg Hx     BP 140/82 (BP Location: Right Arm, Patient  Position: Sitting, Cuff Size: Normal)   Pulse 86   Ht 5\' 4"  (1.626 m)   Wt 158 lb (71.7 kg)   SpO2 98%   BMI 27.12 kg/m   Review of Systems denies weight loss, blurry vision, headache, n/v, urinary frequency, muscle cramps, excessive diaphoresis, depression, and cold intolerance.  He has mild memory loss and doe.       Objective:   Physical Exam VS: see vs page GEN:  no distress HEAD: head: no deformity eyes: no periorbital swelling, no proptosis external nose and ears are normal NECK: supple, thyroid is not enlarged CHEST WALL: no deformity LUNGS: clear to auscultation CV: reg rate and rhythm, no murmur.   MUSCULOSKELETAL: muscle bulk and strength are grossly normal.  no obvious joint swelling.  gait is normal and steady EXTEMITIES: no deformity.  no ulcer on the feet.  feet are of normal color and temp.  1+ bilat leg edema PULSES: dorsalis pedis intact bilat.  no carotid bruit NEURO:  cn 2-12 grossly intact.   readily moves all 4's.  sensation is intact to touch on the feet SKIN:  Normal texture and temperature.  No rash or suspicious lesion is visible.  Old healed surgical scars (vein harvest) on both feet.  NODES:  None palpable at the neck PSYCH: alert, well-oriented.  Does not appear anxious nor depressed.  Lab Results  Component Value Date   HGBA1C 6.2 03/11/2019   Lab Results  Component Value Date   CREATININE 1.57 (H) 02/03/2019   BUN 39 (H) 02/03/2019   NA 141 02/03/2019   K 4.7 02/03/2019   CL 103 02/03/2019   CO2 28 02/03/2019   I have reviewed outside records, and summarized:  Pt was noted to have DM, and referred here.  Insulin was reduce due to severe hypoglycemia.    I personally reviewed electrocardiogram tracing (01/23/19): Indication: f/u cardiomyopathy Impression: NSR, with PVC's.  No MI.  No hypertrophy.  Compared to 01/17/19: PVC's are new.      Assessment & Plan:  Insulin-requiring type 2 DM, with CAD: this is the best control this pt should  aim for, given this regimen, which does match insulin to his changing needs throughout the day. SDOH: frail elderly state: he is not a candidate for aggressive glycemic control.  Hypoglycemia, severe: this limits aggressiveness of glycemic control.  Renal failure: This limits rx options.   Patient Instructions  good diet and exercise significantly improve the control of your diabetes.  please let me know if you wish to be referred to a dietician.  high blood sugar is very risky to your health.  you should see an eye doctor and dentist every year.  It is very important to get all recommended vaccinations.  Controlling your blood pressure and cholesterol drastically reduces the damage diabetes does to your body.  Those who smoke should quit.  Please discuss these with your doctor.   check your blood sugar twice a day.  vary the time of day when you check, between before the 3 meals, and at bedtime.  also check if you have symptoms of your blood sugar being too high or too low.  please keep a record of the readings and bring it to your next appointment here (or you can bring the meter itself).  You can write it on any piece of paper.  please call us sooner if your blood sugar goes below 70, or if you have a lot of readings over 200.   Please continue the same Levemir for now.   Please come back for a follow-up appointment in 6 weeks.   You have the option of adding a different non-insulin medication, and reducing the insulin.  We'll address this later.

## 2019-04-05 DIAGNOSIS — E119 Type 2 diabetes mellitus without complications: Secondary | ICD-10-CM | POA: Insufficient documentation

## 2019-04-11 IMAGING — CT CT ANGIO CHEST
1 of 6 series · 1 of 28 positions shown · IV contrast (iopamidol)
Comparison: No priors.

CLINICAL DATA: 88-year-old male with history of severe aortic
stenosis. Preprocedural study prior to potential transcatheter
aortic valve replacement (TAVR) procedure.

EXAM:
CT ANGIOGRAPHY CHEST, ABDOMEN AND PELVIS
TECHNIQUE: Multidetector CT imaging through the chest, abdomen and pelvis was
performed using the standard protocol during bolus administration of
intravenous contrast. Multiplanar reconstructed images and MIPs were
obtained and reviewed to evaluate the vascular anatomy.
CONTRAST:  100mL OJZAO8-X6H IOPAMIDOL (OJZAO8-X6H) INJECTION 76%

[Series 259: — · 0.18mm/px · 1 of 7 slices shown]
[im 5/7]
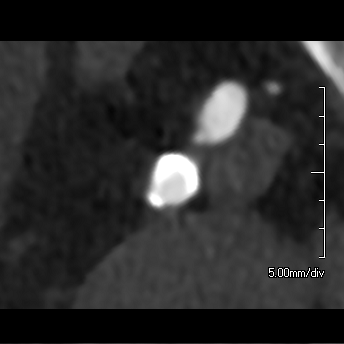

[1 of 28 positions shown; findings below may reference images not displayed]

FINDINGS: CTA CHEST FINDINGS

Cardiovascular: Heart size is enlarged with left ventricular
dilatation. There is no significant pericardial fluid, thickening or
pericardial calcification. There is aortic atherosclerosis, as well
as atherosclerosis of the great vessels of the mediastinum and the
coronary arteries, including calcified atherosclerotic plaque in the
left main, left anterior descending, left circumflex and right
coronary arteries. Status post median sternotomy for CABG including
[REDACTED] to the LAD. Thickening calcification of the aortic valve.

Mediastinum/Lymph Nodes: No pathologically enlarged mediastinal or
hilar lymph nodes. Esophagus is unremarkable in appearance. No
axillary lymphadenopathy.

Lungs/Pleura: In the central aspect of the right upper lobe there is
a perihilar mass measuring 5.2 x 2.6 x 3.2 cm (axial image 40 of
series 14 and coronal image 84 of series 16) which is macrolobulated
with spiculated margins and retraction of adjacent structures,
including spiculations extending to the overlying pleura which is
thickened. Internal areas of potential calcification, and likely
multiple internal collateral vessels. This abuts the hilum where
there are nonenlarged lymph nodes. Small right pleural effusion
lying dependently, likely malignant. A few other scattered tiny
pulmonary nodules are noted in the lungs, nonspecific. No acute
consolidative airspace disease. Trace left pleural effusion lying
dependently.

Musculoskeletal/Soft Tissues: Median sternotomy. There are no
aggressive appearing lytic or blastic lesions noted in the
visualized portions of the skeleton.

CTA ABDOMEN AND PELVIS FINDINGS

Hepatobiliary: No suspicious cystic or solid hepatic lesions. No
intra or extrahepatic biliary ductal dilatation. 2 cm calcified
gallstone in the gallbladder. No findings to suggest an acute
cholecystitis at this time.

Pancreas: No pancreatic mass. No pancreatic ductal dilatation. No
pancreatic or peripancreatic fluid or inflammatory changes.

Spleen: Unremarkable.

Adrenals/Urinary Tract: 2.6 cm low-attenuation lesion in the lower
pole the left kidney is compatible with a simple cyst. No suspicious
renal lesions. Multifocal cortical thinning in the kidneys
bilaterally, most compatible with mild scarring. Bilateral adrenal
glands are normal in appearance. No hydroureteronephrosis. Urinary
bladder is normal in appearance.

Stomach/Bowel: Postoperative changes of gastrojejunostomy. No
pathologic dilatation of small bowel or colon. Numerous colonic
diverticulae are noted, without surrounding inflammatory changes to
suggest an acute diverticulitis at this time. Normal appendix.

Vascular/Lymphatic: Aortic atherosclerosis, without evidence of
aneurysm or dissection in the abdominal or pelvic vasculature.
Vascular findings and measurements pertinent to potential TAVR
procedure, as detailed below. Retroaortic left renal vein (normal
anatomical variant) incidentally noted. No lymphadenopathy noted in
the abdomen or pelvis.

Reproductive: Prostate gland and seminal vesicles are unremarkable
in appearance.

Other: No significant volume of ascites.  No pneumoperitoneum.

Musculoskeletal: There are no aggressive appearing lytic or blastic
lesions noted in the visualized portions of the skeleton.

VASCULAR MEASUREMENTS PERTINENT TO TAVR:

AORTA:

Minimal Aortic Viameter-C8 x 14 mm

Severity of Aortic Calcification-severe

RIGHT PELVIS:

Right Common Iliac Artery -

Minimal Liameter-4.H x 6.1 mm

Tortuosity-mild

Calcification-moderate to severe

Right External Iliac Artery -

Minimal Wiameter-3.V x 4.3 mm

Tortuosity-moderate

Calcification-mild-to-moderate

Right Common Femoral Artery -

Minimal Diameter-P.M x 4.9 mm

Tortuosity-mild

Calcification-moderate

LEFT PELVIS:

Left Common Iliac Artery -

Minimal Riameter-R.M x 5.0 mm

Tortuosity-mild

Calcification-moderate to severe

Left External Iliac Artery -

Minimal 3iameter-Z.M x 4.8 mm

Tortuosity-mild

Calcification-mild-to-moderate

Left Common Femoral Artery -

Minimal Aiameter-9.Q x 2.9 mm

Tortuosity-mild

Calcification-moderate

Review of the MIP images confirms the above findings.
IMPRESSION: 1. Vascular findings and measurements pertinent to potential TAVR
procedure, as detailed above.
2. Thickening calcification of the aortic valve, compatible with the
reported clinical history of aortic stenosis.
3. Aggressive appearing perihilar mass in the right upper lobe
measuring 5.2 x 2.6 x 3.2 cm, highly concerning for primary
bronchogenic carcinoma. Further evaluation with PET-CT is
recommended in the near future for staging purposes.
4. Cholelithiasis without evidence of acute cholecystitis at this
time.
5. Colonic diverticulosis without evidence of acute diverticulitis
at this time.
6. Aortic atherosclerosis, in addition to left main and 3 vessel
coronary artery disease. Status post median sternotomy for CABG
including [REDACTED] to the LAD.
7. Additional incidental findings, as above.

These results will be called to the ordering clinician or
representative by the Radiologist Assistant, and communication
documented in the PACS or zVision Dashboard.

## 2019-05-05 DIAGNOSIS — L602 Onychogryphosis: Secondary | ICD-10-CM | POA: Diagnosis not present

## 2019-05-05 DIAGNOSIS — L84 Corns and callosities: Secondary | ICD-10-CM | POA: Diagnosis not present

## 2019-05-05 DIAGNOSIS — E1159 Type 2 diabetes mellitus with other circulatory complications: Secondary | ICD-10-CM | POA: Diagnosis not present

## 2019-05-14 ENCOUNTER — Other Ambulatory Visit: Payer: Self-pay

## 2019-05-15 ENCOUNTER — Ambulatory Visit (INDEPENDENT_AMBULATORY_CARE_PROVIDER_SITE_OTHER): Payer: Medicare Other | Admitting: Endocrinology

## 2019-05-15 ENCOUNTER — Encounter: Payer: Self-pay | Admitting: Endocrinology

## 2019-05-15 VITALS — BP 120/88 | HR 77 | Ht 64.0 in | Wt 156.0 lb

## 2019-05-15 DIAGNOSIS — Z794 Long term (current) use of insulin: Secondary | ICD-10-CM

## 2019-05-15 DIAGNOSIS — E119 Type 2 diabetes mellitus without complications: Secondary | ICD-10-CM

## 2019-05-15 LAB — POCT GLYCOSYLATED HEMOGLOBIN (HGB A1C): Hemoglobin A1C: 6.1 % — AB (ref 4.0–5.6)

## 2019-05-15 MED ORDER — LEVEMIR FLEXTOUCH 100 UNIT/ML ~~LOC~~ SOPN: 24.0000 [IU] | PEN_INJECTOR | SUBCUTANEOUS | 6 refills | Status: DC

## 2019-05-15 NOTE — Patient Instructions (Addendum)
check your blood sugar twice a day.  vary the time of day when you check, between before the 3 meals, and at bedtime.  also check if you have symptoms of your blood sugar being too high or too low.  please keep a record of the readings and bring it to your next appointment here (or you can bring the meter itself).  You can write it on any piece of paper.  please call us sooner if your blood sugar goes below 70, or if you have a lot of readings over 200.   Please reduce the Levemir to 24 units each morning.   Please come back for a follow-up appointment in 2 months.   You have the option of adding a different non-insulin medication, and further reducing the insulin.  We'll address this later.

## 2019-05-15 NOTE — Progress Notes (Signed)
Subjective:    Patient ID: Austin Lloyd, male    DOB: 1929-08-28, 86 y.o.   MRN: [id number]  HPI Pt returns for f/u of diabetes mellitus: DM type: Insulin-requiring type 2 Dx'ed: 4709 Complications: CAD and renal insuff Therapy: insulin since soon after dx DKA: never Severe hypoglycemia: last episode was aprox 2015 Pancreatitis: never Pancreatic imaging: normal on 2019 CT.   SDOH: none Other: frail elderly state: he is not a candidate for aggressive glycemic control, or for multiple daily injections Interval history: He brings a record of his cbg's which I have reviewed today.  cbg varies from 71-186.  pt states he feels well in general. Past Medical History:  Diagnosis Date  . Aortic stenosis   . Bladder cancer ([place])    T1 status post BCG  . 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 Coronary artery disease with history of myocardial infarction without history of CABG    LIMA to the LAD, SVG to RCA 2016 for treatment of the percent left main stenosis and 80% RCA stenosis.    . Depression   . Diabetes mellitus, type II ([place])   . Hypertension   . Ischemic cardiomyopathy    EF 40% 2019  . Lung cancer ([place])    Adenocarcinoma of the right lung moderately differentiated  . PVC's (premature ventricular contractions)   . S/P TAVR (transcatheter aortic valve replacement)     Past Surgical History:  Procedure Laterality Date  . CORONARY ARTERY BYPASS GRAFT     2014  . INGUINAL HERNIA REPAIR    . INTRAOPERATIVE TRANSTHORACIC ECHOCARDIOGRAM  02/05/2018   Procedure: INTRAOPERATIVE TRANSTHORACIC ECHOCARDIOGRAM;  Surgeon: [name], MD;  Location: [facility] OR;  Service: Open Heart Surgery;;  . LUNG REMOVAL, PARTIAL    . RIGHT/LEFT HEART CATH AND CORONARY/GRAFT ANGIOGRAPHY N/A 12/21/2017   Procedure: RIGHT/LEFT HEART CATH AND CORONARY/GRAFT ANGIOGRAPHY;  Surgeon: [name], MD;  Location: [place] [facility];  Service: Cardiovascular;  Laterality: N/A;  . TRANSCATHETER AORTIC VALVE REPLACEMENT, TRANSFEMORAL  02/05/2018   TRANSCATHETER AORTIC VALVE REPLACEMENT, TRANSFEMORAL (  . TRANSCATHETER AORTIC VALVE REPLACEMENT, TRANSFEMORAL N/A 02/05/2018   Procedure: TRANSCATHETER AORTIC VALVE REPLACEMENT, TRANSFEMORAL;  Surgeon: [name], MD;  Location: [place];  Service: Open Heart Surgery;  Laterality: N/A;  . Ulcer surgery      Social History   Socioeconomic History  . Marital status: Married    Spouse name: Not on file  . Number of children: 3  . Years of education: Not on file  . Highest education level: Not on file  Occupational History  . 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 Smoking status: Former Smoker    Packs/day: 0.20    Years: 30.00    Pack years: 6.00    Types: Cigarettes  . Smokeless tobacco: Never Used  Substance and Sexual Activity  . Alcohol use: Yes    Alcohol/week: 2.0 standard drinks    Types: 2 Cans of beer per week    Comment: 2 cans beer per week.  . Drug use: Never  . Sexual activity: Not on file  Other Topics Concern  . Not on file  Social History Narrative   Lives with wife daughter.  Three children and one grandchild.    Social Determinants of Health   Financial Resource Strain:   . Difficulty of Paying Living Expenses: Not on file  Food Insecurity:   . Worried About Charity fundraiser in the Last Year: Not on file  . Ran Out of Food in the Last Year: Not on file  Transportation Needs:   . Lack of Transportation (Medical): Not on file  . Lack of Transportation (Non-Medical): Not on file  Physical Activity:   . Days of Exercise per Week: Not on file  . Minutes of Exercise per Session: Not on file  Stress:   . Feeling of Stress : Not on file  Social Connections:   . Frequency of Communication with Friends and Family: Not on file  . Frequency of Social Gatherings with Friends  and Family: Not on file  . Attends Religious Services: Not on file  . Active Member of Clubs or Organizations: Not on file  . Attends Archivist Meetings: Not on file  . Marital Status: Not on file  Intimate Partner Violence:   . Fear of Current or Ex-Partner: Not on file  . Emotionally Abused: Not on file  . Physically Abused: Not on file  . Sexually Abused: Not on file    Current Outpatient Medications on File Prior to Visit  Medication Sig Dispense Refill  . ACCU-CHEK SOFTCLIX LANCETS lancets Test blood sugar three times a day.  DX E11.9 (Patient taking differently: Check qd.  DX E11.9) 200 each 6  . aspirin EC 81 MG tablet Take 81 mg by mouth every evening.     . ferrous sulfate 325 (65 FE) MG tablet Take 325 mg by mouth daily with breakfast.    . furosemide (LASIX) 40 MG tablet TAKE 1 TABLET BY MOUTH EVERY DAY 90 tablet 3  . glucose blood (ACCU-CHEK AVIVA PLUS) test strip Test blood sugar three times a day.  DX E11.9 (Patient taking differently: Check qd.  DX E11.9) 200 each 6  . levocetirizine (XYZAL) 5 MG tablet Take 1 tablet (5 mg total) by mouth every evening. 90 tablet 2  . losartan (COZAAR) 25 MG tablet Take 25 mg by mouth daily.    Marland Kitchen lovastatin (MEVACOR) 40 MG tablet Take 1 tablet (40 mg total) by mouth at bedtime. 90 tablet 0  . metoprolol succinate (TOPROL XL) 25 MG 24 hr tablet Take 1 tablet (25 mg total) by mouth daily. 30 tablet 2  . Multiple Vitamin (MULTIVITAMIN) tablet Take 1 tablet by mouth daily.    . tamsulosin (FLOMAX) 0.4 MG CAPS capsule TAKE 1 CAPSULE BY MOUTH EVERY DAY 90 capsule 3   No current facility-administered medications on file prior to visit.    No Known Allergies  Family History  Problem Relation Age of Onset  . AAA (abdominal aortic aneurysm) Mother   . Cancer Father        Brain tumor  . Diabetes Neg Hx     BP 120/88 (BP Location: Left Arm, Patient Position: Sitting, Cuff Size: Normal)   Pulse 77   Ht 5\' 4"  (1.626 m)   Wt 156  lb (70.8 kg)   SpO2 90%   BMI 26.78 kg/m   Review of Systems He denies hypoglycemia    Objective:   Physical Exam VITAL SIGNS:  See vs page GENERAL: no distress Pulses: dorsalis pedis intact bilat.   MSK: no deformity of the feet CV: 1+ bilat leg edema Skin:  no ulcer on the feet.  normal color and temp on the feet. Neuro: sensation is intact to touch on the feet.   A1c=6.1%  Lab Results  Component Value Date  CREATININE 1.57 (H) 02/03/2019   BUN 39 (H) 02/03/2019   NA 141 02/03/2019   K 4.7 02/03/2019   CL 103 02/03/2019   CO2 28 02/03/2019       Assessment & Plan:  Type 2 DM: overcontrolled.  Frail elderly state: he is not a candidate for aggressive glycemic control Renal failure: he is not a candidate for a slower time-release qam insulin.  Patient Instructions  check your blood sugar twice a day.  vary the time of day when you check, between before the 3 meals, and at bedtime.  also check if you have symptoms of your blood sugar being too high or too low.  please keep a record of the readings and bring it to your next appointment here (or you can bring the meter itself).  You can write it on any piece of paper.  please call us sooner if your blood sugar goes below 70, or if you have a lot of readings over 200.   Please reduce the Levemir to 24 units each morning.   Please come back for a follow-up appointment in 2 months.   You have the option of adding a different non-insulin medication, and further reducing the insulin.  We'll address this later.

## 2019-05-17 DIAGNOSIS — Z23 Encounter for immunization: Secondary | ICD-10-CM | POA: Diagnosis not present

## 2019-05-20 ENCOUNTER — Other Ambulatory Visit: Payer: Self-pay | Admitting: Family Medicine

## 2019-05-20 ENCOUNTER — Other Ambulatory Visit: Payer: Self-pay | Admitting: Physician Assistant

## 2019-05-20 DIAGNOSIS — R059 Cough, unspecified: Secondary | ICD-10-CM

## 2019-05-20 DIAGNOSIS — R05 Cough: Secondary | ICD-10-CM

## 2019-06-07 DIAGNOSIS — Z23 Encounter for immunization: Secondary | ICD-10-CM | POA: Diagnosis not present

## 2019-06-17 ENCOUNTER — Telehealth: Payer: Self-pay | Admitting: Family Medicine

## 2019-06-17 ENCOUNTER — Other Ambulatory Visit: Payer: Self-pay

## 2019-06-17 NOTE — Telephone Encounter (Signed)
Called left message to call back 

## 2019-06-17 NOTE — Telephone Encounter (Signed)
OK to take 

## 2019-06-17 NOTE — Telephone Encounter (Signed)
Please advise if ok to take OTC recommended by pharmacist

## 2019-06-17 NOTE — Telephone Encounter (Signed)
Caller : Patient's wife  [facility] : levocetirizine (XYZAL) 5 MG tablet [[id number]]   Pharmacist recommend something over the counter   levocetirizine dihydrochloride 5 mg  Please advise if patient would be ok taking this medication that pharmacist recommend.

## 2019-06-18 ENCOUNTER — Encounter: Payer: Self-pay | Admitting: Family Medicine

## 2019-06-18 ENCOUNTER — Ambulatory Visit (INDEPENDENT_AMBULATORY_CARE_PROVIDER_SITE_OTHER): Payer: Medicare Other | Admitting: Family Medicine

## 2019-06-18 VITALS — BP 122/80 | HR 63 | Temp 96.4°F | Ht 64.0 in | Wt 155.2 lb

## 2019-06-18 DIAGNOSIS — R05 Cough: Secondary | ICD-10-CM | POA: Diagnosis not present

## 2019-06-18 DIAGNOSIS — M545 Low back pain, unspecified: Secondary | ICD-10-CM

## 2019-06-18 DIAGNOSIS — R059 Cough, unspecified: Secondary | ICD-10-CM

## 2019-06-18 MED ORDER — LEVOCETIRIZINE DIHYDROCHLORIDE 5 MG PO TABS: 5.0000 mg | ORAL_TABLET | Freq: Every evening | ORAL | 2 refills | Status: AC

## 2019-06-18 MED ORDER — PREDNISONE 20 MG PO TABS: 40.0000 mg | ORAL_TABLET | Freq: Every day | ORAL | 0 refills | Status: AC

## 2019-06-18 NOTE — Progress Notes (Signed)
Musculoskeletal Exam  Patient: [facility] DOB: 02/19/1930  DOS: 06/18/2019  SUBJECTIVE:  Chief Complaint:   Chief Complaint  Patient presents with  . Back Pain    Jabreel Chimento is a 86 y.o.  male for evaluation and treatment of his back pain.   Onset:  2 weeks ago.  No inj or change in acitivity.  Location: lower, b/l Character:  sharp  Progression of issue:  has worsened Associated symptoms: pain has caused balance issues; no bruising, redness or swelling, rash Usually walking makes it worse and standing for periods of time.  Denies bowel/bladder incontinence or weakness Treatment: to date has been: Topical Voltaren.   Neurovascular symptoms: no  Pt w hx of cough 2/2 upper airway cough syndrome.  He takes levocetirizine.  He is requesting a refill of this.  He is wondering if over-the-counter options are just as good.  The medicine is not causing him any side effects and is effective.  Past Medical History:  Diagnosis Date  . Aortic stenosis   . Bladder cancer ([place])    T1 status post BCG  . 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 Coronary artery disease with history of myocardial infarction without history of CABG    LIMA to the LAD, SVG to RCA 2016 for treatment of the percent left main stenosis and 80% RCA stenosis.    . Depression   . Diabetes mellitus, type II ([place])   . Hypertension   . Ischemic cardiomyopathy    EF 40% 2019  . 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 PVC's (premature ventricular contractions)   . S/P TAVR (transcatheter aortic valve replacement)     Objective:  VITAL SIGNS: BP 122/80 (BP Location: Left Arm, Patient Position: Sitting, Cuff Size: Normal)   Pulse 63   Temp (!) 96.4 F (35.8 C) (Temporal)   Ht 5\' 4"  (1.626 m)   Wt 155 lb 4 oz (70.4 kg)   SpO2 93%   BMI 26.65 kg/m  Constitutional: Well formed, well  developed. No acute distress. HENT: Normocephalic, atraumatic.  Thorax & Lungs:  No accessory muscle use Skin: Warm. Dry. No erythema. No rash.  Musculoskeletal: low back.   Tenderness to palpation: yes over lumbar parasp msc b/l Deformity: no Ecchymosis: no Straight leg test: negative for Very poor hamstring flexibility b/l. Neurologic: Normal sensory function. No focal deficits noted. DTR's equal and symmetric in LE's. No clonus. Psychiatric: Normal mood. Age appropriate judgment and insight. Alert & oriented x 3.    Assessment:  Acute bilateral low back pain without sciatica - Plan: predniSONE (DELTASONE) 20 MG tablet  Cough - Plan: levocetirizine (XYZAL) 5 MG tablet  Plan: Orders as above. Stretches/exercises, heat, ice, Tylenol, IBU prednisone burst. F/u in 4 weeks prn. The patient voiced understanding and agreement to the plan.   D[place], DO 06/18/19  12:20 PM

## 2019-06-18 NOTE — Telephone Encounter (Signed)
Patient has appt today and will discuss

## 2019-06-18 NOTE — Patient Instructions (Signed)
OK to take Tylenol 1000 mg (2 extra strength tabs) or 975 mg (3 regular strength tabs) every 6 hours as needed.  Ice/cold pack over area for 10-15 min twice daily.  Heat (pad or rice pillow in microwave) over affected area, 10-15 minutes twice daily.   EXERCISES  RANGE OF MOTION (ROM) AND STRETCHING EXERCISES - Low Back Pain Most people with lower back pain will find that their symptoms get worse with excessive bending forward (flexion) or arching at the lower back (extension). The exercises that will help resolve your symptoms will focus on the opposite motion.  If you have pain, numbness or tingling which travels down into your buttocks, leg or foot, the goal of the therapy is for these symptoms to move closer to your back and eventually resolve. Sometimes, these leg symptoms will get better, but your lower back pain may worsen. This is often an indication of progress in your rehabilitation. Be very alert to any changes in your symptoms and the activities in which you participated in the 24 hours prior to the change. Sharing this information with your caregiver will allow him or her to most efficiently treat your condition. These exercises may help you when beginning to rehabilitate your injury. Your symptoms may resolve with or without further involvement from your physician, physical therapist or athletic trainer. While completing these exercises, remember:   Restoring tissue flexibility helps normal motion to return to the joints. This allows healthier, less painful movement and activity.  An effective stretch should be held for at least 30 seconds.  A stretch should never be painful. You should only feel a gentle lengthening or release in the stretched tissue. FLEXION RANGE OF MOTION AND STRETCHING EXERCISES:  STRETCH - Flexion, Single Knee to Chest   Lie on a firm bed or floor with both legs extended in front of you.  Keeping one leg in contact with the floor, bring your opposite knee  to your chest. Hold your leg in place by either grabbing behind your thigh or at your knee.  Pull until you feel a gentle stretch in your low back. Hold 30 seconds.  Slowly release your grasp and repeat the exercise with the opposite side. Repeat 2 times. Complete this exercise 3 times per week.   STRETCH - Flexion, Double Knee to Chest  Lie on a firm bed or floor with both legs extended in front of you.  Keeping one leg in contact with the floor, bring your opposite knee to your chest.  Tense your stomach muscles to support your back and then lift your other knee to your chest. Hold your legs in place by either grabbing behind your thighs or at your knees.  Pull both knees toward your chest until you feel a gentle stretch in your low back. Hold 30 seconds.  Tense your stomach muscles and slowly return one leg at a time to the floor. Repeat 2 times. Complete this exercise 3 times per week.   STRETCH - Low Trunk Rotation  Lie on a firm bed or floor. Keeping your legs in front of you, bend your knees so they are both pointed toward the ceiling and your feet are flat on the floor.  Extend your arms out to the side. This will stabilize your upper body by keeping your shoulders in contact with the floor.  Gently and slowly drop both knees together to one side until you feel a gentle stretch in your low back. Hold for 30 seconds.  Tense  your stomach muscles to support your lower back as you bring your knees back to the starting position. Repeat the exercise to the other side. Repeat 2 times. Complete this exercise at least 3 times per week.   EXTENSION RANGE OF MOTION AND FLEXIBILITY EXERCISES:  STRETCH - Extension, Prone on Elbows   Lie on your stomach on the floor, a bed will be too soft. Place your palms about shoulder width apart and at the height of your head.  Place your elbows under your shoulders. If this is too painful, stack pillows under your chest.  Allow your body to  relax so that your hips drop lower and make contact more completely with the floor.  Hold this position for 30 seconds.  Slowly return to lying flat on the floor. Repeat 2 times. Complete this exercise 3 times per week.   RANGE OF MOTION - Extension, Prone Press Ups  Lie on your stomach on the floor, a bed will be too soft. Place your palms about shoulder width apart and at the height of your head.  Keeping your back as relaxed as possible, slowly straighten your elbows while keeping your hips on the floor. You may adjust the placement of your hands to maximize your comfort. As you gain motion, your hands will come more underneath your shoulders.  Hold this position 30 seconds.  Slowly return to lying flat on the floor. Repeat 2 times. Complete this exercise 3 times per week.   RANGE OF MOTION- Quadruped, Neutral Spine   Assume a hands and knees position on a firm surface. Keep your hands under your shoulders and your knees under your hips. You may place padding under your knees for comfort.  Drop your head and point your tailbone toward the ground below you. This will round out your lower back like an angry cat. Hold this position for 30 seconds.  Slowly lift your head and release your tail bone so that your back sags into a large arch, like an old horse.  Hold this position for 30 seconds.  Repeat this until you feel limber in your low back.  Now, find your "sweet spot." This will be the most comfortable position somewhere between the two previous positions. This is your neutral spine. Once you have found this position, tense your stomach muscles to support your low back.  Hold this position for 30 seconds. Repeat 2 times. Complete this exercise 3 times per week.   STRENGTHENING EXERCISES - Low Back Sprain These exercises may help you when beginning to rehabilitate your injury. These exercises should be done near your "sweet spot." This is the neutral, low-back arch, somewhere  between fully rounded and fully arched, that is your least painful position. When performed in this safe range of motion, these exercises can be used for people who have either a flexion or extension based injury. These exercises may resolve your symptoms with or without further involvement from your physician, physical therapist or athletic trainer. While completing these exercises, remember:   Muscles can gain both the endurance and the strength needed for everyday activities through controlled exercises.  Complete these exercises as instructed by your physician, physical therapist or athletic trainer. Increase the resistance and repetitions only as guided.  You may experience muscle soreness or fatigue, but the pain or discomfort you are trying to eliminate should never worsen during these exercises. If this pain does worsen, stop and make certain you are following the directions exactly. If the pain is still  present after adjustments, discontinue the exercise until you can discuss the trouble with your caregiver.  STRENGTHENING - Deep Abdominals, Pelvic Tilt   Lie on a firm bed or floor. Keeping your legs in front of you, bend your knees so they are both pointed toward the ceiling and your feet are flat on the floor.  Tense your lower abdominal muscles to press your low back into the floor. This motion will rotate your pelvis so that your tail bone is scooping upwards rather than pointing at your feet or into the floor. With a gentle tension and even breathing, hold this position for 3 seconds. Repeat 2 times. Complete this exercise 3 times per week.   STRENGTHENING - Abdominals, Crunches   Lie on a firm bed or floor. Keeping your legs in front of you, bend your knees so they are both pointed toward the ceiling and your feet are flat on the floor. Cross your arms over your chest.  Slightly tip your chin down without bending your neck.  Tense your abdominals and slowly lift your trunk high  enough to just clear your shoulder blades. Lifting higher can put excessive stress on the lower back and does not further strengthen your abdominal muscles.  Control your return to the starting position. Repeat 2 times. Complete this exercise 3 times per week.   STRENGTHENING - Quadruped, Opposite UE/LE Lift   Assume a hands and knees position on a firm surface. Keep your hands under your shoulders and your knees under your hips. You may place padding under your knees for comfort.  Find your neutral spine and gently tense your abdominal muscles so that you can maintain this position. Your shoulders and hips should form a rectangle that is parallel with the floor and is not twisted.  Keeping your trunk steady, lift your right hand no higher than your shoulder and then your left leg no higher than your hip. Make sure you are not holding your breath. Hold this position for 30 seconds.  Continuing to keep your abdominal muscles tense and your back steady, slowly return to your starting position. Repeat with the opposite arm and leg. Repeat 2 times. Complete this exercise 3 times per week.   STRENGTHENING - Abdominals and Quadriceps, Straight Leg Raise   Lie on a firm bed or floor with both legs extended in front of you.  Keeping one leg in contact with the floor, bend the other knee so that your foot can rest flat on the floor.  Find your neutral spine, and tense your abdominal muscles to maintain your spinal position throughout the exercise.  Slowly lift your straight leg off the floor about 6 inches for a count of 3, making sure to not hold your breath.  Still keeping your neutral spine, slowly lower your leg all the way to the floor. Repeat this exercise with each leg 2 times. Complete this exercise 3 times per week.  POSTURE AND BODY MECHANICS CONSIDERATIONS - Low Back Sprain Keeping correct posture when sitting, standing or completing your activities will reduce the stress put on  different body tissues, allowing injured tissues a chance to heal and limiting painful experiences. The following are general guidelines for improved posture.  While reading these guidelines, remember:  The exercises prescribed by your provider will help you have the flexibility and strength to maintain correct postures.  The correct posture provides the best environment for your joints to work. All of your joints have less wear and tear when properly  supported by a spine with good posture. This means you will experience a healthier, less painful body.  Correct posture must be practiced with all of your activities, especially prolonged sitting and standing. Correct posture is as important when doing repetitive low-stress activities (typing) as it is when doing a single heavy-load activity (lifting).  RESTING POSITIONS Consider which positions are most painful for you when choosing a resting position. If you have pain with flexion-based activities (sitting, bending, stooping, squatting), choose a position that allows you to rest in a less flexed posture. You would want to avoid curling into a fetal position on your side. If your pain worsens with extension-based activities (prolonged standing, working overhead), avoid resting in an extended position such as sleeping on your stomach. Most people will find more comfort when they rest with their spine in a more neutral position, neither too rounded nor too arched. Lying on a non-sagging bed on your side with a pillow between your knees, or on your back with a pillow under your knees will often provide some relief. Keep in mind, being in any one position for a prolonged period of time, no matter how correct your posture, can still lead to stiffness.  PROPER SITTING POSTURE In order to minimize stress and discomfort on your spine, you must sit with correct posture. Sitting with good posture should be effortless for a healthy body. Returning to good posture is  a gradual process. Many people can work toward this most comfortably by using various supports until they have the flexibility and strength to maintain this posture on their own. When sitting with proper posture, your ears will fall over your shoulders and your shoulders will fall over your hips. You should use the back of the chair to support your upper back. Your lower back will be in a neutral position, just slightly arched. You may place a small pillow or folded towel at the base of your lower back for  support.  When working at a desk, create an environment that supports good, upright posture. Without extra support, muscles tire, which leads to excessive strain on joints and other tissues. Keep these recommendations in mind:  CHAIR:  A chair should be able to slide under your desk when your back makes contact with the back of the chair. This allows you to work closely.  The chair's height should allow your eyes to be level with the upper part of your monitor and your hands to be slightly lower than your elbows.  BODY POSITION  Your feet should make contact with the floor. If this is not possible, use a foot rest.  Keep your ears over your shoulders. This will reduce stress on your neck and low back.  INCORRECT SITTING POSTURES  If you are feeling tired and unable to assume a healthy sitting posture, do not slouch or slump. This puts excessive strain on your back tissues, causing more damage and pain. Healthier options include:  Using more support, like a lumbar pillow.  Switching tasks to something that requires you to be upright or walking.  Talking a brief walk.  Lying down to rest in a neutral-spine position.  PROLONGED STANDING WHILE SLIGHTLY LEANING FORWARD  When completing a task that requires you to lean forward while standing in one place for a long time, place either foot up on a stationary 2-4 inch high object to help maintain the best posture. When both feet are on the  ground, the lower back tends to lose its  slight inward curve. If this curve flattens (or becomes too large), then the back and your other joints will experience too much stress, tire more quickly, and can cause pain.  CORRECT STANDING POSTURES Proper standing posture should be assumed with all daily activities, even if they only take a few moments, like when brushing your teeth. As in sitting, your ears should fall over your shoulders and your shoulders should fall over your hips. You should keep a slight tension in your abdominal muscles to brace your spine. Your tailbone should point down to the ground, not behind your body, resulting in an over-extended swayback posture.   INCORRECT STANDING POSTURES  Common incorrect standing postures include a forward head, locked knees and/or an excessive swayback. WALKING Walk with an upright posture. Your ears, shoulders and hips should all line-up.  PROLONGED ACTIVITY IN A FLEXED POSITION When completing a task that requires you to bend forward at your waist or lean over a low surface, try to find a way to stabilize 3 out of 4 of your limbs. You can place a hand or elbow on your thigh or rest a knee on the surface you are reaching across. This will provide you more stability, so that your muscles do not tire as quickly. By keeping your knees relaxed, or slightly bent, you will also reduce stress across your lower back. CORRECT LIFTING TECHNIQUES  DO :  Assume a wide stance. This will provide you more stability and the opportunity to get as close as possible to the object which you are lifting.  Tense your abdominals to brace your spine. Bend at the knees and hips. Keeping your back locked in a neutral-spine position, lift using your leg muscles. Lift with your legs, keeping your back straight.  Test the weight of unknown objects before attempting to lift them.  Try to keep your elbows locked down at your sides in order get the best strength from your  shoulders when carrying an object.     Always ask for help when lifting heavy or awkward objects. INCORRECT LIFTING TECHNIQUES DO NOT:   Lock your knees when lifting, even if it is a small object.  Bend and twist. Pivot at your feet or move your feet when needing to change directions.  Assume that you can safely pick up even a paperclip without proper posture.

## 2019-07-09 DIAGNOSIS — L84 Corns and callosities: Secondary | ICD-10-CM | POA: Diagnosis not present

## 2019-07-09 DIAGNOSIS — L602 Onychogryphosis: Secondary | ICD-10-CM | POA: Diagnosis not present

## 2019-07-09 DIAGNOSIS — E1159 Type 2 diabetes mellitus with other circulatory complications: Secondary | ICD-10-CM | POA: Diagnosis not present

## 2019-07-15 ENCOUNTER — Other Ambulatory Visit: Payer: Self-pay

## 2019-07-17 ENCOUNTER — Encounter: Payer: Self-pay | Admitting: Endocrinology

## 2019-07-17 ENCOUNTER — Ambulatory Visit (INDEPENDENT_AMBULATORY_CARE_PROVIDER_SITE_OTHER): Payer: Medicare Other | Admitting: Endocrinology

## 2019-07-17 ENCOUNTER — Other Ambulatory Visit: Payer: Self-pay

## 2019-07-17 VITALS — BP 120/70 | HR 78 | Ht 64.0 in | Wt 150.0 lb

## 2019-07-17 DIAGNOSIS — E119 Type 2 diabetes mellitus without complications: Secondary | ICD-10-CM

## 2019-07-17 DIAGNOSIS — Z794 Long term (current) use of insulin: Secondary | ICD-10-CM

## 2019-07-17 LAB — POCT GLYCOSYLATED HEMOGLOBIN (HGB A1C): Hemoglobin A1C: 6.5 % — AB (ref 4.0–5.6)

## 2019-07-17 MED ORDER — LEVEMIR FLEXTOUCH 100 UNIT/ML ~~LOC~~ SOPN: 20.0000 [IU] | PEN_INJECTOR | SUBCUTANEOUS | 6 refills | Status: DC

## 2019-07-17 NOTE — Progress Notes (Signed)
Subjective:    Patient ID: Austin Lloyd, male    DOB: Aug 06, 1929, 86 y.o.   MRN: [id number]  HPI Pt returns for f/u of diabetes mellitus: DM type: Insulin-requiring type 2 Dx'ed: 7124 Complications: CAD and CRI Therapy: insulin since soon after dx DKA: never Severe hypoglycemia: last episode was aprox 2015 Pancreatitis: never Pancreatic imaging: normal on 2019 CT.   SDOH: none Other: frail elderly state: he is not a candidate for aggressive glycemic control, or for multiple daily injections Interval history: He brings a record of his cbg's which I have reviewed today.  cbg varies from 86-195.  There is no trend throughout the day.  pt states he feels well in general.  Past Medical History:  Diagnosis Date  . Aortic stenosis   . Bladder cancer ([place])    T1 status post BCG  . 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 Coronary artery disease with history of myocardial infarction without history of CABG    LIMA to the LAD, SVG to RCA 2016 for treatment of the percent left main stenosis and 80% RCA stenosis.    . Depression   . Diabetes mellitus, type II ([place])   . Hypertension   . Ischemic cardiomyopathy    EF 40% 2019  . Lung cancer ([place])    Adenocarcinoma of the right lung moderately differentiated  . PVC's (premature ventricular contractions)   . S/P TAVR (transcatheter aortic valve replacement)     Past Surgical History:  Procedure Laterality Date  . CORONARY ARTERY BYPASS GRAFT     2014  . INGUINAL HERNIA REPAIR    . INTRAOPERATIVE TRANSTHORACIC ECHOCARDIOGRAM  02/05/2018   Procedure: INTRAOPERATIVE TRANSTHORACIC ECHOCARDIOGRAM;  Surgeon: [name], MD;  Location: [facility];  Service: Open Heart Surgery;;  . LUNG REMOVAL, PARTIAL    . RIGHT/LEFT HEART CATH AND CORONARY/GRAFT ANGIOGRAPHY N/A 12/21/2017   Procedure: RIGHT/LEFT HEART CATH AND CORONARY/GRAFT ANGIOGRAPHY;  Surgeon: [name], MD;  Location: [place] [facility];  Service: Cardiovascular;  Laterality: N/A;  . TRANSCATHETER AORTIC VALVE REPLACEMENT, TRANSFEMORAL  02/05/2018   TRANSCATHETER AORTIC VALVE REPLACEMENT, TRANSFEMORAL (  . TRANSCATHETER AORTIC VALVE REPLACEMENT, TRANSFEMORAL N/A 02/05/2018   Procedure: TRANSCATHETER AORTIC VALVE REPLACEMENT, TRANSFEMORAL;  Surgeon: [name], MD;  Location: [place];  Service: Open Heart Surgery;  Laterality: N/A;  . Ulcer surgery      Social History   Socioeconomic History  . Marital status: Married    Spouse name: Not on file  . Number of children: 3  . Years of education: Not on file  . Highest education level: Not on file  Occupational History  . 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 Smoking status: Former Smoker    Packs/day: 0.20    Years: 30.00    Pack years: 6.00    Types: Cigarettes  . Smokeless tobacco: Never Used  Substance and Sexual Activity  . Alcohol use: Yes    Alcohol/week: 2.0 standard drinks    Types: 2 Cans of beer per week    Comment: 2 cans beer per week.  . Drug use: Never  . Sexual activity: Not on file  Other Topics Concern  . Not on file  Social History Narrative   Lives with wife daughter.  Three children and one grandchild.    Social Determinants of Health   Financial Resource Strain:   .  Difficulty of Paying Living Expenses:   Food Insecurity:   . Worried About Charity fundraiser in the Last Year:   . Arboriculturist in the Last Year:   Transportation Needs:   . Film/video editor (Medical):   Marland Kitchen Lack of Transportation (Non-Medical):   Physical Activity:   . Days of Exercise per Week:   . Minutes of Exercise per Session:   Stress:   . Feeling of Stress :   Social Connections:   . Frequency of Communication with Friends and Family:   . Frequency of Social Gatherings with Friends and Family:   . Attends Religious Services:   . Active Member of  Clubs or Organizations:   . Attends Archivist Meetings:   Marland Kitchen Marital Status:   Intimate Partner Violence:   . Fear of Current or Ex-Partner:   . Emotionally Abused:   Marland Kitchen Physically Abused:   . Sexually Abused:     Current Outpatient Medications on File Prior to Visit  Medication Sig Dispense Refill  . aspirin EC 81 MG tablet Take 81 mg by mouth every evening.     . ferrous sulfate 325 (65 FE) MG tablet Take 325 mg by mouth daily with breakfast.    . furosemide (LASIX) 40 MG tablet TAKE 1 TABLET BY MOUTH EVERY DAY 90 tablet 3  . levocetirizine (XYZAL) 5 MG tablet Take 1 tablet (5 mg total) by mouth every evening. 90 tablet 2  . losartan (COZAAR) 25 MG tablet Take 25 mg by mouth daily.    Marland Kitchen lovastatin (MEVACOR) 40 MG tablet Take 1 tablet (40 mg total) by mouth at bedtime. 90 tablet 1  . metoprolol succinate (TOPROL XL) 25 MG 24 hr tablet Take 1 tablet (25 mg total) by mouth daily. 30 tablet 2  . Multiple Vitamin (MULTIVITAMIN) tablet Take 1 tablet by mouth daily.    . tamsulosin (FLOMAX) 0.4 MG CAPS capsule TAKE 1 CAPSULE BY MOUTH EVERY DAY 90 capsule 3   No current facility-administered medications on file prior to visit.    No Known Allergies  Family History  Problem Relation Age of Onset  . AAA (abdominal aortic aneurysm) Mother   . Cancer Father        Brain tumor  . Diabetes Neg Hx     BP 120/70   Pulse 78   Ht 5\' 4"  (1.626 m)   Wt 150 lb (68 kg)   SpO2 97%   BMI 25.75 kg/m    Review of Systems He denies hypoglycemia    Objective:   Physical Exam VITAL SIGNS:  See vs page GENERAL: no distress Pulses: dorsalis pedis intact bilat.   MSK: no deformity of the feet CV: trace bilat leg edema Skin:  no ulcer on the feet.  normal color and temp on the feet. Neuro: sensation is intact to touch on the feet   Lab Results  Component Value Date   HGBA1C 6.5 (A) 07/17/2019       Assessment & Plan:  Insulin-requiring type 2 DM, with CAD Frail elderly  state: he is not a candidate for aggressive glycemic control CRI: we discussed poss going off insulin in favor of oral rx, but this would limit rx options  Patient Instructions  check your blood sugar twice a day.  vary the time of day when you check, between before the 3 meals, and at bedtime.  also check if you have symptoms of your blood sugar being too high or  too low.  please keep a record of the readings and bring it to your next appointment here (or you can bring the meter itself).  You can write it on any piece of paper.  please call us sooner if your blood sugar goes below 70, or if you have a lot of readings over 200.   Please reduce the Levemir to 20 units each morning.   Please come back for a follow-up appointment in 2 months.   You have the option of adding a different non-insulin medication, and further reducing the insulin.  We'll address this later.

## 2019-07-17 NOTE — Patient Instructions (Addendum)
check your blood sugar twice a day.  vary the time of day when you check, between before the 3 meals, and at bedtime.  also check if you have symptoms of your blood sugar being too high or too low.  please keep a record of the readings and bring it to your next appointment here (or you can bring the meter itself).  You can write it on any piece of paper.  please call us sooner if your blood sugar goes below 70, or if you have a lot of readings over 200.   Please reduce the Levemir to 20 units each morning.   Please come back for a follow-up appointment in 2 months.   You have the option of adding a different non-insulin medication, and further reducing the insulin.  We'll address this later.

## 2019-07-18 ENCOUNTER — Other Ambulatory Visit: Payer: Self-pay | Admitting: Family Medicine

## 2019-07-23 DIAGNOSIS — Z7189 Other specified counseling: Secondary | ICD-10-CM | POA: Insufficient documentation

## 2019-07-23 NOTE — Progress Notes (Signed)
Cardiology Office Note   Date:  07/24/2019   ID:  Austin Lloyd, DOB March 31, 1929, MRN [id number]  PCP:  Shelda Pal, DO  Cardiologist:   [name], MD   Chief Complaint  Patient presents with  . TAVR      History of Present Illness: Austin Lloyd is a 86 y.o. male who presents for evaluation of CAD s/p 2V CABG in 2016 in [place].  He had ischemic cardiomyopathy.  He had severe AS and had successful TAVR with a8mm Edwards Sapien 3 THV via the TF approach on 02/05/18. Post operative echoshowed EF 40-45%, normally functioning TAVR with trivial PVL; mean gradient 8 mm Hg.  However, follow-up echo in December demonstrated that the EF was now reduced at 25 to 30%.  He had normal valve function.  He had a repeat echo in Nov and the EF was unchanged at 30 - 35%.  He had stable TAVR.   Since he was last seen he has had no new cardiovascular complaints.  He walks about a mile or 2 every day.  He walks slowly.  He is not getting any excessive shortness of breath, PND or orthopnea.  Denies any chest pressure, neck or arm discomfort.  He has had no weight gain or edema.  His appetite is not as good as it was previously.  He does have occasional hip pain.  Past Medical History:  Diagnosis Date  . Aortic stenosis   . Bladder cancer ([facility])    T1 status post BCG  . 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 Coronary artery disease with history of myocardial infarction without history of CABG    LIMA to the LAD, SVG to RCA 2016 for treatment of the percent left main stenosis and 80% RCA stenosis.    . Depression   . Diabetes mellitus, type II ([place])   . Hypertension   . Ischemic cardiomyopathy    EF 40% 2019  . Lung cancer ([place])    Adenocarcinoma of the right lung moderately differentiated  . PVC's (premature ventricular contractions)   . S/P TAVR (transcatheter aortic valve replacement)      Past Surgical History:  Procedure Laterality Date  . CORONARY ARTERY BYPASS GRAFT     2014  . INGUINAL HERNIA REPAIR    . INTRAOPERATIVE TRANSTHORACIC ECHOCARDIOGRAM  02/05/2018   Procedure: INTRAOPERATIVE TRANSTHORACIC ECHOCARDIOGRAM;  Surgeon: [name], MD;  Location[facility];  Service: Open Heart Surgery;;  . LUNG REMOVAL, PARTIAL    . RIGHT/LEFT HEART CATH AND CORONARY/GRAFT ANGIOGRAPHY N/A 12/21/2017   Procedure: RIGHT/LEFT HEART CATH AND CORONARY/GRAFT ANGIOGRAPHY;  Surgeon: [name], MD;  Location: [place] [facility];  Service: Cardiovascular;  Laterality: N/A;  . TRANSCATHETER AORTIC VALVE REPLACEMENT, TRANSFEMORAL  02/05/2018   TRANSCATHETER AORTIC VALVE REPLACEMENT, TRANSFEMORAL (  . TRANSCATHETER AORTIC VALVE REPLACEMENT, TRANSFEMORAL N/A 02/05/2018   Procedure: TRANSCATHETER AORTIC VALVE REPLACEMENT, TRANSFEMORAL;  Surgeon: [name], MD;  Location: [place];  Service: Open Heart Surgery;  Laterality: N/A;  . Ulcer surgery       Current Outpatient Medications  Medication Sig Dispense Refill  . ACCU-CHEK AVIVA PLUS test strip TEST BLOOD SUGAR THREE TIMES A DAY. DX E11.9 200 strip 6  . Accu-Chek Softclix Lancets lancets TEST BLOOD SUGAR THREE TIMES A DAY. DX E11.9 200 each 6  . aspirin EC 81 MG tablet Take 81 mg by mouth every evening.     Marland Kitchen  ferrous sulfate 325 (65 FE) MG tablet Take 325 mg by mouth daily with breakfast.    . furosemide (LASIX) 40 MG tablet TAKE 1 TABLET BY MOUTH EVERY DAY 90 tablet 3  . insulin detemir (LEVEMIR FLEXTOUCH) 100 UNIT/ML FlexPen Inject 20 Units into the skin every morning. And pen needles 1/day 5 pen 6  . levocetirizine (XYZAL) 5 MG tablet Take 1 tablet (5 mg total) by mouth every evening. 90 tablet 2  . losartan (COZAAR) 25 MG tablet Take 25 mg by mouth daily.    Marland Kitchen lovastatin (MEVACOR) 40 MG tablet Take 1 tablet (40 mg total) by mouth at bedtime. 90 tablet 1  . metoprolol succinate (TOPROL XL) 25 MG 24  hr tablet Take 1 tablet (25 mg total) by mouth daily. 30 tablet 2  . Multiple Vitamin (MULTIVITAMIN) tablet Take 1 tablet by mouth daily.    . tamsulosin (FLOMAX) 0.4 MG CAPS capsule TAKE 1 CAPSULE BY MOUTH EVERY DAY 90 capsule 3   No current facility-administered medications for this visit.    Allergies:   Patient has no known allergies.    ROS:  Please see the history of present illness.   Otherwise, review of systems are positive for none.   All other systems are reviewed and negative.    PHYSICAL EXAM: VS:  BP 120/73   Pulse 69   Temp (!) 97.2 F (36.2 C)   Ht 5\' 4"  (1.626 m)   Wt 150 lb 12.8 oz (68.4 kg)   SpO2 93%   BMI 25.88 kg/m  , BMI Body mass index is 25.88 kg/m. GENERAL: Frail appearing pretty good for his age NECK:  No jugular venous distention, waveform within normal limits, carotid upstroke brisk and symmetric, no bruits, no thyromegaly LUNGS:  Clear to auscultation bilaterally CHEST:  Unremarkable HEART:  PMI not displaced or sustained,S1 and S2 within normal limits, no S3, no S4, no clicks, no rubs, soft apical brief systolic murmur radiating slightly at the aortic outflow tract, no diastolic murmurs ABD:  Flat, positive bowel sounds normal in frequency in pitch, no bruits, no rebound, no guarding, no midline pulsatile mass, no hepatomegaly, no splenomegaly EXT:  2 plus pulses throughout, no edema, no cyanosis no clubbing    EKG:  EKG is  ordered today. The ekg ordered today demonstrates sinus rhythm, rate 69, axis within normal limits, borderline interventricular conduction delay, premature ventricular contractions    Recent Labs: 02/03/2019: ALT 35; BUN 39; Creatinine 1.57; Hemoglobin 14.0; Platelets 170; Potassium 4.7; Sodium 141    Lipid Panel    Component Value Date/Time   CHOL 187 08/28/2018 1033   CHOL 183 11/06/2017 0815   TRIG 121.0 08/28/2018 1033   HDL 54.00 08/28/2018 1033   HDL 58 11/06/2017 0815   CHOLHDL 3 08/28/2018 1033   VLDL 24.2  08/28/2018 1033   LDLCALC 109 (H) 08/28/2018 1033   LDLCALC 108 (H) 11/06/2017 0815      Wt Readings from Last 3 Encounters:  07/24/19 150 lb 12.8 oz (68.4 kg)  07/17/19 150 lb (68 kg)  06/18/19 155 lb 4 oz (70.4 kg)      Other studies Reviewed: Additional studies/ records that were reviewed today include: Labs. Review of the above records demonstrates:  Please see elsewhere in the note.     ASSESSMENT AND PLAN:  Severe AS s/p TAVR:  He is up-to-date with an echocardiogram last year.  No change in therapy.   Chronic combined S/D CHF:  His EF is moderately reduced.  However, this was improved over serial echoes.  I think he is euvolemic.  I will check a basic metabolic profile today.  Otherwise no change in therapy.   CKD stage III:    I will check a basic metabolic profile today as above.  HTN:  The blood pressure runs low.  I think he tolerates the meds as listed.  No change in therapy.  He previously was hypertensive.  CAD s/p CABG:   He has had no chest discomfort.  No change in therapy.  Bigeminy:    He was having bigeminy.  He still having some PVCs but he is not noticing these.  No change in therapy.  Covid education: He has had both of his vaccines.  Current medicines are reviewed at length with the patient today.  The patient does not have concerns regarding medicines.  The following changes have been made:  As above  Labs/ tests ordered today include:   None  Orders Placed This Encounter  Procedures  . Basic metabolic panel  . CBC  . EKG 12-Lead     Disposition:   FU with me in six months   Signed, [name], MD  07/24/2019 11:19 AM    [facility]  ADDENDUM:   Patient is 1 year s/p TAVR.  [facility] Cardiomyopathy Questionnaire  KCCQ-12 01/27/2019  1 a. Ability to shower/bathe Not at all limited  1 b. Ability to walk 1 block Slightly limited  1 c. Ability to hurry/jog Other, Did not do  2. Edema feet/ankles/legs  Never over the past 2 weeks  3. Limited by fatigue At least once a day  4. Limited by dyspnea 3+ times a week, not every day  5. Sitting up / on 3+ pillows Never over the past 2 weeks  6. Limited enjoyment of life Slightly limited  7. Rest of life w/ symptoms Mostly satisfied  8 a. Participation in hobbies N/A, did not do for other reasons  8 b. Participation in chores Slightly limited  8 c. Visiting family/friends N/A, did not do for other reasons    He has NYHA class II symptoms.   Angelena Form PA-C  [facility]

## 2019-07-24 ENCOUNTER — Encounter: Payer: Self-pay | Admitting: Cardiology

## 2019-07-24 ENCOUNTER — Ambulatory Visit (INDEPENDENT_AMBULATORY_CARE_PROVIDER_SITE_OTHER): Payer: Medicare Other | Admitting: Cardiology

## 2019-07-24 ENCOUNTER — Other Ambulatory Visit: Payer: Self-pay

## 2019-07-24 VITALS — BP 120/73 | HR 69 | Temp 97.2°F | Ht 64.0 in | Wt 150.8 lb

## 2019-07-24 DIAGNOSIS — I10 Essential (primary) hypertension: Secondary | ICD-10-CM

## 2019-07-24 DIAGNOSIS — I5042 Chronic combined systolic (congestive) and diastolic (congestive) heart failure: Secondary | ICD-10-CM | POA: Diagnosis not present

## 2019-07-24 DIAGNOSIS — N1831 Chronic kidney disease, stage 3a: Secondary | ICD-10-CM | POA: Diagnosis not present

## 2019-07-24 DIAGNOSIS — I498 Other specified cardiac arrhythmias: Secondary | ICD-10-CM | POA: Diagnosis not present

## 2019-07-24 DIAGNOSIS — I2510 Atherosclerotic heart disease of native coronary artery without angina pectoris: Secondary | ICD-10-CM | POA: Diagnosis not present

## 2019-07-24 DIAGNOSIS — Z952 Presence of prosthetic heart valve: Secondary | ICD-10-CM

## 2019-07-24 DIAGNOSIS — Z7189 Other specified counseling: Secondary | ICD-10-CM

## 2019-07-24 LAB — BASIC METABOLIC PANEL
BUN/Creatinine Ratio: 26 — ABNORMAL HIGH (ref 10–24)
BUN: 40 mg/dL — ABNORMAL HIGH (ref 8–27)
CO2: 24 mmol/L (ref 20–29)
Calcium: 10 mg/dL (ref 8.6–10.2)
Chloride: 107 mmol/L — ABNORMAL HIGH (ref 96–106)
Creatinine, Ser: 1.54 mg/dL — ABNORMAL HIGH (ref 0.76–1.27)
GFR calc Af Amer: 46 mL/min/{1.73_m2} — ABNORMAL LOW (ref >59)
GFR calc non Af Amer: 40 mL/min/{1.73_m2} — ABNORMAL LOW (ref >59)
Glucose: 82 mg/dL (ref 65–99)
Potassium: 5.3 mmol/L — ABNORMAL HIGH (ref 3.5–5.2)
Sodium: 146 mmol/L — ABNORMAL HIGH (ref 134–144)

## 2019-07-24 LAB — CBC
Hematocrit: 38.8 % (ref 37.5–51.0)
Hemoglobin: 13.1 g/dL (ref 13.0–17.7)
MCH: 30.5 pg (ref 26.6–33.0)
MCHC: 33.8 g/dL (ref 31.5–35.7)
MCV: 90 fL (ref 79–97)
Platelets: 228 10*3/uL (ref 150–450)
RBC: 4.3 x10E6/uL (ref 4.14–5.80)
RDW: 13.1 % (ref 11.6–15.4)
WBC: 8.5 10*3/uL (ref 3.4–10.8)

## 2019-07-24 NOTE — Patient Instructions (Signed)
Medication Instructions:  NO CHANGES *If you need a refill on your cardiac medications before your next appointment, please call your pharmacy*  Lab Work: Your physician recommends that you return for lab work today (CBC, BMP) If you have labs (blood work) drawn today and your tests are completely normal, you will receive your results only by: Marland Kitchen MyChart Message (if you have MyChart) OR . A paper copy in the mail If you have any lab test that is abnormal or we need to change your treatment, we will call you to review the results.  Testing/Procedures: NONE ORDERED THIS VISIT   Follow-Up: At [facility], you and your health needs are our priority.  As part of our continuing mission to provide you with exceptional heart care, we have created designated Provider Care Teams.  These Care Teams include your primary Cardiologist (physician) and Advanced Practice Providers (APPs -  Physician Assistants and Nurse Practitioners) who all work together to provide you with the care you need, when you need it.  Your next appointment:   6 month(s)  You will receive a reminder letter in the mail two months in advance. If you don't receive a letter, please call our office to schedule the follow-up appointment.  The format for your next appointment:   In Person  Provider:   [name], MD

## 2019-07-30 ENCOUNTER — Other Ambulatory Visit: Payer: Self-pay

## 2019-07-30 DIAGNOSIS — Z79899 Other long term (current) drug therapy: Secondary | ICD-10-CM

## 2019-07-30 NOTE — Progress Notes (Signed)
Repeat BMP order placed due to mildly elevated potassium per [name].

## 2019-07-31 ENCOUNTER — Other Ambulatory Visit: Payer: Self-pay

## 2019-07-31 DIAGNOSIS — Z79899 Other long term (current) drug therapy: Secondary | ICD-10-CM

## 2019-07-31 LAB — BASIC METABOLIC PANEL
BUN/Creatinine Ratio: 26 — ABNORMAL HIGH (ref 10–24)
BUN: 45 mg/dL — ABNORMAL HIGH (ref 8–27)
CO2: 22 mmol/L (ref 20–29)
Calcium: 9.1 mg/dL (ref 8.6–10.2)
Chloride: 102 mmol/L (ref 96–106)
Creatinine, Ser: 1.74 mg/dL — ABNORMAL HIGH (ref 0.76–1.27)
GFR calc Af Amer: 40 mL/min/{1.73_m2} — ABNORMAL LOW (ref >59)
GFR calc non Af Amer: 34 mL/min/{1.73_m2} — ABNORMAL LOW (ref >59)
Glucose: 170 mg/dL — ABNORMAL HIGH (ref 65–99)
Potassium: 5.5 mmol/L — ABNORMAL HIGH (ref 3.5–5.2)
Sodium: 139 mmol/L (ref 134–144)

## 2019-08-01 ENCOUNTER — Telehealth: Payer: Self-pay

## 2019-08-01 DIAGNOSIS — N1831 Chronic kidney disease, stage 3a: Secondary | ICD-10-CM

## 2019-08-01 NOTE — Telephone Encounter (Signed)
Reviewed results with pt. And reiterated many times he is to hold his Cozaar and drink fluids to help flush his kidneys and that Dr.Hochrein would like this lab repeated on Tuesday. Pt states he is having labs on Monday so does he still need the labs? Notified if they are drawing the BMET then this should be fine, if not, he still needs this lab drawn. Pt asked if he is supposed to be taking anything else. Notified again he is to just hold the Cozaar and hydrate this weekend with the follow up labs on Tuesday. Pt verbalized understanding with no other questions at this time.

## 2019-08-04 ENCOUNTER — Inpatient Hospital Stay: Payer: Medicare Other | Attending: Hematology

## 2019-08-04 ENCOUNTER — Telehealth: Payer: Self-pay | Admitting: Cardiology

## 2019-08-04 ENCOUNTER — Telehealth: Payer: Self-pay | Admitting: Hematology

## 2019-08-04 ENCOUNTER — Inpatient Hospital Stay (HOSPITAL_BASED_OUTPATIENT_CLINIC_OR_DEPARTMENT_OTHER): Payer: Medicare Other | Admitting: Hematology

## 2019-08-04 ENCOUNTER — Other Ambulatory Visit: Payer: Self-pay

## 2019-08-04 VITALS — BP 122/76 | HR 91 | Temp 97.4°F | Resp 18 | Ht 64.0 in | Wt 152.9 lb

## 2019-08-04 DIAGNOSIS — I2510 Atherosclerotic heart disease of native coronary artery without angina pectoris: Secondary | ICD-10-CM

## 2019-08-04 DIAGNOSIS — C3400 Malignant neoplasm of unspecified main bronchus: Secondary | ICD-10-CM

## 2019-08-04 DIAGNOSIS — C9110 Chronic lymphocytic leukemia of B-cell type not having achieved remission: Secondary | ICD-10-CM | POA: Insufficient documentation

## 2019-08-04 LAB — CBC WITH DIFFERENTIAL/PLATELET
Abs Immature Granulocytes: 0.03 10*3/uL (ref 0.00–0.07)
Basophils Absolute: 0 10*3/uL (ref 0.0–0.1)
Basophils Relative: 0 %
Eosinophils Absolute: 0.1 10*3/uL (ref 0.0–0.5)
Eosinophils Relative: 1 %
HCT: 39.9 % (ref 39.0–52.0)
Hemoglobin: 13 g/dL (ref 13.0–17.0)
Immature Granulocytes: 0 %
Lymphocytes Relative: 28 %
Lymphs Abs: 2.7 10*3/uL (ref 0.7–4.0)
MCH: 30.4 pg (ref 26.0–34.0)
MCHC: 32.6 g/dL (ref 30.0–36.0)
MCV: 93.4 fL (ref 80.0–100.0)
Monocytes Absolute: 0.8 10*3/uL (ref 0.1–1.0)
Monocytes Relative: 9 %
Neutro Abs: 6 10*3/uL (ref 1.7–7.7)
Neutrophils Relative %: 62 %
Platelets: 176 10*3/uL (ref 150–400)
RBC: 4.27 MIL/uL (ref 4.22–5.81)
RDW: 14.1 % (ref 11.5–15.5)
WBC: 9.6 10*3/uL (ref 4.0–10.5)
nRBC: 0 % (ref 0.0–0.2)

## 2019-08-04 LAB — CMP (CANCER CENTER ONLY)
ALT: 28 U/L (ref 0–44)
AST: 31 U/L (ref 15–41)
Albumin: 3.5 g/dL (ref 3.5–5.0)
Alkaline Phosphatase: 113 U/L (ref 38–126)
Anion gap: 10 (ref 5–15)
BUN: 41 mg/dL — ABNORMAL HIGH (ref 8–23)
CO2: 26 mmol/L (ref 22–32)
Calcium: 9.5 mg/dL (ref 8.9–10.3)
Chloride: 104 mmol/L (ref 98–111)
Creatinine: 1.7 mg/dL — ABNORMAL HIGH (ref 0.61–1.24)
GFR, Est AFR Am: 41 mL/min — ABNORMAL LOW (ref >60)
GFR, Estimated: 35 mL/min — ABNORMAL LOW (ref >60)
Glucose, Bld: 134 mg/dL — ABNORMAL HIGH (ref 70–99)
Potassium: 5 mmol/L (ref 3.5–5.1)
Sodium: 140 mmol/L (ref 135–145)
Total Bilirubin: 0.5 mg/dL (ref 0.3–1.2)
Total Protein: 6.4 g/dL — ABNORMAL LOW (ref 6.5–8.1)

## 2019-08-04 LAB — LACTATE DEHYDROGENASE: LDH: 301 U/L — ABNORMAL HIGH (ref 98–192)

## 2019-08-04 NOTE — Telephone Encounter (Signed)
Returned call to patient he stated he had potassium repeated today at [facility].Potassium 5.0.Stated he has been drinking 2 glasses of orange juice every day.He will stop.I will make Dr.Hochrein aware.

## 2019-08-04 NOTE — Telephone Encounter (Signed)
Scheduled per 5/17 los. Printed AVS and calendar for pt.

## 2019-08-04 NOTE — Telephone Encounter (Signed)
   Pt went to see his oncology today and he had blood work done there today. [name] said he will send [name] a copy of his blood work result, and that [name] figured out why his potassium is high because he drinks  a lot orange juice every day. He said if Dr. Virginia Rochester has any question to call him  Please advise

## 2019-08-04 NOTE — Progress Notes (Signed)
HEMATOLOGY/ONCOLOGY CLINIC NOTE  Date of Service: 08/04/2019  Patient Care Team: Austin Pal, DO as PCP - General (Family Medicine) Austin Breeding, MD as PCP - Cardiology (Cardiology)  CHIEF COMPLAINTS/PURPOSE OF CONSULTATION:  History of CLL and NSCLC  Oncologic History:   Austin Lloyd was diagnosed with CLL in 2012 and was treated with one cycle on Bendamustine and possibly Rituxan (incomplete records) before developing acute kidney injury and then completing 5 cycles of Gazyva and Chlorambucil.   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 Treated from 06/13/16 to 06/23/16 with stereotactic body radiotherapy to right upper lobe, total dose of 5000cGy in 5 fractions   Bladder cancer prior to 2010. Low grade prostate cancer currently.   HISTORY OF PRESENTING ILLNESS:   Austin Lloyd is a wonderful 86 y.o. male who has been referred to [place] by Dr. Riki Lloyd for evaluation and management of history of CLL and non-small cell lung cancer. He is accompanied today by his wife. The pt reports that he is doing well overall and has enjoyed his recent move from [place].   The pt notes that he has had stable energy levels. He notes that he has not had any changes in breathing and has not developed any constitutional symptom, denying fevers, chills, night sweats and unexpected weight loss. The pt notes that he has not been able to walk as far as he used to and has to sit down often.   The pt reports that his elevated WBC was incidentally picked up while living in [place] in 2012. The pt notes that his treatment with Bendamustine, and possibly Rituxan, was complicated by CHF and acute kidney injury and he notes that he had two infusions at the time.    The pt notes that his lung cancer was initially diagnosed in 2015 and was resected. It then recurred in 2018 and was treated with radiation.   The pt has had three squamous cell carcinomas, one under left eye, one under left lear, and on occipital scalp. He also has had a basal cell carcinoma. He will be establishing care with dermatology in October.  The pt notes that his bladder cancer and prostate cancer has received BCG and denies radiation to his prostate. He has begun care with urology Dr. Louis Lloyd.   He has just begun seeing Dr. Minus Lloyd in Cardiology as well for his history of CHF, CAD, and aortic stenosis. The pt had an MI in 2015 with subsequent CABG x2.   Most recent lab results (11/06/17) of CBC and CMP is as follows: all values are WNL except for HGB at 12.9, RDW at 15.6, BUN at 33, Creatinine at 1.47, GFR at 43.  On review of systems, pt reports stable energy levels, and denies fevers, chills, night sweats, unexpected weight loss, noticing any new lumps or bumps, changes in breathing, blood in the urine, abdominal pains, chest pain, and any other symptoms.   On PMHx the pt reports DM type II, CKD stage III, CAD s/p two vessel CABG, CLL, invasive adenocarcinoma. 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 Basal cell carcinoma, high grade bladder cancer, low grade prostate cancer, MI in June 2015, acute decompensated heart failure. On Social Hx the pt reports quit smoking cigarettes more than  45 years ago  Interval History:   Austin Lloyd returns today for management and evaluation of his CLL and history of NSCLC. We are joined today by his wife, Austin Lloyd. The patient's last visit with [place] was on 02/03/2019. The pt reports that he is doing well overall.  The pt reports that he has had intermittent loss of appetite and some fatigue. Pt walks 1-2 miles in the morning and is often more tired in the afternoon. He has continued to  follow with his Urologist every 6 months. Upon his last visit he had no issues with his prostate or any evidence of bladder cancer. Pt has had both doses of the COVID19 vaccine and had chills after the second dose. He has been drinking 3 large glasses of orange juice per day.   Lab results today (08/04/19) of CBC w/diff and CMP is as follows: all values are WNL except for Glucose at 134, BUN at 41, Creatinine at 1.70, Total Protein at 6.4, GFR Est Non Af Am at 35. 08/04/2019 LDH at 301  On review of systems, pt reports loss of appetite, fatigue and denies SOB, cough, unexpected weight loss, abdominal pain and any other symptoms.   MEDICAL HISTORY:  Past Medical History:  Diagnosis Date  . Aortic stenosis   . Bladder cancer ([place])    T1 status post BCG  . 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 Coronary artery disease with history of myocardial infarction without history of CABG    LIMA to the LAD, SVG to RCA 2016 for treatment of the percent left main stenosis and 80% RCA stenosis.    . Depression   . Diabetes mellitus, type II ([place])   . Hypertension   . Ischemic cardiomyopathy    EF 40% 2019  . Lung cancer ([place])    Adenocarcinoma of the right lung moderately differentiated  . PVC's (premature ventricular contractions)   . S/P TAVR (transcatheter aortic valve replacement)     SURGICAL HISTORY: Past Surgical History:  Procedure Laterality Date  . CORONARY ARTERY BYPASS GRAFT     2014  . INGUINAL HERNIA REPAIR    . INTRAOPERATIVE TRANSTHORACIC ECHOCARDIOGRAM  02/05/2018   Procedure: INTRAOPERATIVE TRANSTHORACIC ECHOCARDIOGRAM;  Surgeon: [name], MD;  Location: [facility] OR;  Service: Open Heart Surgery;;  . LUNG REMOVAL, PARTIAL    . RIGHT/LEFT HEART CATH AND CORONARY/GRAFT ANGIOGRAPHY N/A 12/21/2017   Procedure: RIGHT/LEFT HEART CATH AND CORONARY/GRAFT ANGIOGRAPHY;  Surgeon: [name], MD;   Location: [facility];  Service: Cardiovascular;  Laterality: N/A;  . TRANSCATHETER AORTIC VALVE REPLACEMENT, TRANSFEMORAL  02/05/2018   TRANSCATHETER AORTIC VALVE REPLACEMENT, TRANSFEMORAL (  . TRANSCATHETER AORTIC VALVE REPLACEMENT, TRANSFEMORAL N/A 02/05/2018   Procedure: TRANSCATHETER AORTIC VALVE REPLACEMENT, TRANSFEMORAL;  Surgeon: [name], MD;  Location: [facility];  Service: Open Heart Surgery;  Laterality: N/A;  . Ulcer surgery      SOCIAL HISTORY: Social History   Socioeconomic History  . Marital status: Married    Spouse name: Not on file  . Number of children: 3  . Years of education: Not on file  . Highest education level: Not on file  Occupational History  . 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 Smoking status: Former Smoker    Packs/day: 0.20    Years: 30.00    Pack years: 6.00    Types: Cigarettes  .  Smokeless tobacco: Never Used  Substance and Sexual Activity  . Alcohol use: Yes    Alcohol/week: 2.0 standard drinks    Types: 2 Cans of beer per week    Comment: 2 cans beer per week.  . Drug use: Never  . Sexual activity: Not on file  Other Topics Concern  . Not on file  Social History Narrative   Lives with wife daughter.  Three children and one grandchild.    Social Determinants of Health   Financial Resource Strain:   . Difficulty of Paying Living Expenses:   Food Insecurity:   . Worried About Charity fundraiser in the Last Year:   . Arboriculturist in the Last Year:   Transportation Needs:   . Film/video editor (Medical):   Marland Kitchen Lack of Transportation (Non-Medical):   Physical Activity:   . Days of Exercise per Week:   . Minutes of Exercise per Session:   Stress:   . Feeling of Stress :   Social Connections:   . Frequency of Communication with Friends and Family:   . Frequency of Social Gatherings with Friends and Family:   . Attends Religious Services:   . Active Member of  Clubs or Organizations:   . Attends Archivist Meetings:   Marland Kitchen Marital Status:   Intimate Partner Violence:   . Fear of Current or Ex-Partner:   . Emotionally Abused:   Marland Kitchen Physically Abused:   . Sexually Abused:     FAMILY HISTORY: Family History  Problem Relation Age of Onset  . AAA (abdominal aortic aneurysm) Mother   . Cancer Father        Brain tumor  . Diabetes Neg Hx     ALLERGIES:  has No Known Allergies.  MEDICATIONS:  Current Outpatient Medications  Medication Sig Dispense Refill  . ACCU-CHEK AVIVA PLUS test strip TEST BLOOD SUGAR THREE TIMES A DAY. DX E11.9 200 strip 6  . Accu-Chek Softclix Lancets lancets TEST BLOOD SUGAR THREE TIMES A DAY. DX E11.9 200 each 6  . aspirin EC 81 MG tablet Take 81 mg by mouth every evening.     . ferrous sulfate 325 (65 FE) MG tablet Take 325 mg by mouth daily with breakfast.    . furosemide (LASIX) 40 MG tablet TAKE 1 TABLET BY MOUTH EVERY DAY 90 tablet 3  . insulin detemir (LEVEMIR FLEXTOUCH) 100 UNIT/ML FlexPen Inject 20 Units into the skin every morning. And pen needles 1/day 5 pen 6  . levocetirizine (XYZAL) 5 MG tablet Take 1 tablet (5 mg total) by mouth every evening. 90 tablet 2  . losartan (COZAAR) 25 MG tablet Take 25 mg by mouth daily.    Marland Kitchen lovastatin (MEVACOR) 40 MG tablet Take 1 tablet (40 mg total) by mouth at bedtime. 90 tablet 1  . metoprolol succinate (TOPROL XL) 25 MG 24 hr tablet Take 1 tablet (25 mg total) by mouth daily. 30 tablet 2  . Multiple Vitamin (MULTIVITAMIN) tablet Take 1 tablet by mouth daily.    . tamsulosin (FLOMAX) 0.4 MG CAPS capsule TAKE 1 CAPSULE BY MOUTH EVERY DAY 90 capsule 3   No current facility-administered medications for this visit.    REVIEW OF SYSTEMS:   A 10+ POINT REVIEW OF SYSTEMS WAS OBTAINED including neurology, dermatology, psychiatry, cardiac, respiratory, lymph, extremities, GI, GU, Musculoskeletal, constitutional, breasts, reproductive, HEENT.  All pertinent positives are  noted in the HPI.  All others are negative.   PHYSICAL EXAMINATION: ECOG  PERFORMANCE STATUS: 2 - Symptomatic, <50% confined to bed  Vitals:   08/04/19 1203  BP: 122/76  Pulse: 91  Resp: 18  Temp: (!) 97.4 F (36.3 C)  SpO2: 90%   Filed Weights   08/04/19 1203  Weight: 152 lb 14.4 oz (69.4 kg)   .Body mass index is 26.25 kg/m.   GENERAL:alert, in no acute distress and comfortable SKIN: no acute rashes, no significant lesions EYES: conjunctiva are pink and non-injected, sclera anicteric OROPHARYNX: MMM, no exudates, no oropharyngeal erythema or ulceration NECK: supple, no JVD LYMPH:  no palpable lymphadenopathy in the cervical, axillary or inguinal regions LUNGS: clear to auscultation b/l with normal respiratory effort HEART: regular rate & rhythm ABDOMEN:  normoactive bowel sounds , non tender, not distended. No palpable hepatosplenomegaly.  Extremity: no pedal edema PSYCH: alert & oriented x 3 with fluent speech NEURO: no focal motor/sensory deficits  LABORATORY DATA:  I have reviewed the data as listed  . CBC Latest Ref Rng & Units 08/04/2019 07/24/2019 02/03/2019  WBC 4.0 - 10.5 K/uL 9.6 8.5 8.3  Hemoglobin 13.0 - 17.0 g/dL 13.0 13.1 14.0  Hematocrit 39.0 - 52.0 % 39.9 38.8 42.7  Platelets 150 - 400 K/uL 176 228 170   . CBC    Component Value Date/Time   WBC 9.6 08/04/2019 1137   RBC 4.27 08/04/2019 1137   HGB 13.0 08/04/2019 1137   HGB 13.1 07/24/2019 1120   HCT 39.9 08/04/2019 1137   HCT 38.8 07/24/2019 1120   PLT 176 08/04/2019 1137   PLT 228 07/24/2019 1120   MCV 93.4 08/04/2019 1137   MCV 90 07/24/2019 1120   MCH 30.4 08/04/2019 1137   MCHC 32.6 08/04/2019 1137   RDW 14.1 08/04/2019 1137   RDW 13.1 07/24/2019 1120   LYMPHSABS 2.7 08/04/2019 1137   MONOABS 0.8 08/04/2019 1137   EOSABS 0.1 08/04/2019 1137   BASOSABS 0.0 08/04/2019 1137    . CMP Latest Ref Rng & Units 08/04/2019 07/31/2019 07/24/2019  Glucose 70 - 99 mg/dL 134(H) 170(H) 82  BUN 8 -  23 mg/dL 41(H) 45(H) 40(H)  Creatinine 0.61 - 1.24 mg/dL 1.70(H) 1.74(H) 1.54(H)  Sodium 135 - 145 mmol/L 140 139 146(H)  Potassium 3.5 - 5.1 mmol/L 5.0 5.5(H) 5.3(H)  Chloride 98 - 111 mmol/L 104 102 107(H)  CO2 22 - 32 mmol/L 26 22 24   Calcium 8.9 - 10.3 mg/dL 9.5 9.1 10.0  Total Protein 6.5 - 8.1 g/dL 6.4(L) - -  Total Bilirubin 0.3 - 1.2 mg/dL 0.5 - -  Alkaline Phos 38 - 126 U/L 113 - -  AST 15 - 41 U/L 31 - -  ALT 0 - 44 U/L 28 - -     RADIOGRAPHIC STUDIES: I have personally reviewed the radiological images as listed and agreed with the findings in the report. No results found.   08/04/17 CT Chest:    ASSESSMENT & PLAN:  86 y.o. male with  1. History of CLL 03/2013 treated with Bendamustine and possibly Rituxan (outside records incomplete) but discontinued after 1 dose due to acute kidney injury 04/2013 to 08/2013 treated with 5 cycles of Gazyva and Chlorambucil  04/2015 FISH was without chromosomal abnormalities   2. History of Lung Cancer Invasive adenocarcinoma of the right upper lung in 2015, moderately differentiated, initially Stage IB with visceral pleural invasion but resected with clear margins and no lymph node involvement 04/2016 Biopsy confirmed right upper lobe recurrence of non-small cell cancer. Treated with stereotactic body radiotherapy to  right upper lobe , total dose of 5000cGy in 5 fractions  01/2017 New right lower lobe nodule initially concerning for recurrence, but then resolved spontaneously  03/22/18 CT Chest revealed "The partially calcified right perihilar mass is stable from available priors dating back to 01/09/2018. Based on the stability and lack of hypermetabolic activity on PET-CT, this is likely postinflammatory or treated tumor. Continued CT follow-up (3-6 months) recommended. 2. New patchy left lower lobe airspace disease, likely inflammatory and potentially early bronchopneumonia. Radiographic follow up recommended. 3. Stable right upper lobe  solid and ground-glass opacities can be addressed on subsequent follow-up. 4. Coronary and Aortic Atherosclerosis. Previous T AVR."  06/18/18 CT Chest revealed "Stable RIGHT suprahilar mass most consists with post the radiation / surgical therapy change. 2. Waxing waning airspace disease/nodularity in the LEFT lower lobe with increased nodularity. Findings atypical for carcinoma. Consider aspiration pneumonia. Recommend follow-up CT exam in 1-3 months. 3. Stable ground-glass nodules in the upper lobe are indeterminate. Recommend attention on routine oncology surveillance."  3. Prostate cancer low grade Has maintained surveillance with urology and every 6 month PSA checks -on lupron with urology  4. History of high grade bladder cancer Continue follow up with urology  5. History of Squamous cell carcinoma x3 and Basal cell carcinoma -continue f/u with dermatology  6. CKD Stage III - stable . Continue f/u with PCP  7. H/o CAD, CHF, with mod-severe Aortic Stenosis.modMR -continue f/u with cardiology for continued Mx.   8. Bronchopneumonia- seen on 03/22/18 CT Chest, with clinical correlation on 03/27/18  PLAN: -Discussed pt labwork today, 08/04/19; blood counts are nml, blood chemistries and kidney function are stable, LDH is elevated but within pt's normal variation -The pt shows no lab or clinical evidence of recurrence/progression of his CLL at this time.  -No indication for further treatment of lung CA at this time.  -Recommend pt cut down on dietary Potassium.  -Continue to follow-up with Dr. Louis Lloyd for prostate and bladder cancer management   -Continue follow up with Dr. Minus Lloyd in Cardiology -Will get CT Chest in 5 months  -Will see back in 6 months with labs   Orders Placed This Encounter  Procedures  . CBC with Differential/Platelet    Standing Status:   Future    Standing Expiration Date:   09/07/2020  . CMP ([place] only)    Standing Status:   Future     Standing Expiration Date:   08/03/2020  . Lactate dehydrogenase    Standing Status:   Future    Standing Expiration Date:   08/03/2020    FOLLOW UP: CT chest in 5 months RTC with Dr Irene Limbo in 6 months with labs   The total time spent in the appt was 25 minutes and more than 50% was on counseling and direct patient cares.  All of the patient's questions were answered with apparent satisfaction. The patient knows to call the clinic with any problems, questions or concerns.    Sullivan Lone MD [place] AAHIVMS [facility] Hematology/Oncology Physician [facility]  (Office):       [phone] (Work cell):  [phone] (Fax):           [phone]  08/04/2019 12:59 PM  I, Yevette Edwards, am acting as a scribe for Dr. Sullivan Lone.   .I have reviewed the above documentation for accuracy and completeness, and I agree with the above. Brunetta Genera MD

## 2019-08-04 NOTE — Patient Instructions (Signed)
Thank you for choosing [facility] to provide your oncology and hematology care.   Should you have questions after your visit to the [facility] ([facility]), please contact this office at [phone] between 8:30 AM and 4:30 PM.  Voice mails left after 4:00 PM may not be returned until the following business day.  Calls received after 4:30 PM will be answered by an off-site Nurse Triage Line.    Prescription Refills:  Please have your pharmacy contact us directly for most prescription requests.  Contact the office directly for refills of narcotics (pain medications). Allow 48-72 hours for refills.  Appointments: Please contact the [facility] scheduling department [phone] for questions regarding [facility] appointment scheduling.  Contact the schedulers with any scheduling changes so that your appointment can be rescheduled in a timely manner.   Central Scheduling for [place] [phone] - Call to schedule procedures such as PET scans, CT scans, MRI, Ultrasound, etc.  To afford each patient quality time with our providers, please arrive 30 minutes before your scheduled appointment time.  If you arrive late for your appointment, you may be asked to reschedule.  We strive to give you quality time with our providers, and arriving late affects you and other patients whose appointments are after yours. If you are a no show for multiple scheduled visits, you may be dismissed from the clinic at the providers discretion.     Resources: [facility] Social Workers [phone] for additional information on assistance programs or assistance connecting with community support programs   [place] DSS  [phone]: Information regarding food stamps, Medicaid, and utility assistance [place]AT [phone]   [place] [employer]'s shared-ride transportation service for eligible riders who have a disability that prevents them from riding the fixed route bus.   Medicare Rights Center  800-333-4114 Helps people with Medicare understand their rights and benefits, navigate the Medicare system, and secure the quality healthcare they deserve [employer] 800-227-2345 Assists patients locate various types of support and financial assistance Cancer Care: 1-800-813-HOPE (4673) Provides financial assistance, online support groups, medication/co-pay assistance.   Transportation Assistance for appointments at [facility]: Transportation Coordinator [phone]  Again, thank you for choosing [facility] for your care.       

## 2019-08-04 NOTE — Telephone Encounter (Signed)
Great!

## 2019-08-16 ENCOUNTER — Other Ambulatory Visit: Payer: Self-pay | Admitting: Family Medicine

## 2019-08-25 ENCOUNTER — Other Ambulatory Visit: Payer: Self-pay | Admitting: Family Medicine

## 2019-09-01 DIAGNOSIS — C61 Malignant neoplasm of prostate: Secondary | ICD-10-CM | POA: Diagnosis not present

## 2019-09-09 ENCOUNTER — Ambulatory Visit (INDEPENDENT_AMBULATORY_CARE_PROVIDER_SITE_OTHER): Payer: Medicare Other | Admitting: Family Medicine

## 2019-09-09 ENCOUNTER — Encounter: Payer: Self-pay | Admitting: Family Medicine

## 2019-09-09 ENCOUNTER — Other Ambulatory Visit: Payer: Self-pay

## 2019-09-09 VITALS — BP 110/70 | HR 68 | Temp 97.9°F | Resp 12 | Ht 64.0 in | Wt 152.8 lb

## 2019-09-09 DIAGNOSIS — E785 Hyperlipidemia, unspecified: Secondary | ICD-10-CM | POA: Diagnosis not present

## 2019-09-09 DIAGNOSIS — M6798 Unspecified disorder of synovium and tendon, other site: Secondary | ICD-10-CM | POA: Diagnosis not present

## 2019-09-09 DIAGNOSIS — M67952 Unspecified disorder of synovium and tendon, left thigh: Secondary | ICD-10-CM

## 2019-09-09 DIAGNOSIS — I10 Essential (primary) hypertension: Secondary | ICD-10-CM

## 2019-09-09 DIAGNOSIS — M67951 Unspecified disorder of synovium and tendon, right thigh: Secondary | ICD-10-CM

## 2019-09-09 LAB — LIPID PANEL
Cholesterol: 195 mg/dL (ref 0–200)
HDL: 44.9 mg/dL (ref >39.00)
NonHDL: 150.04
Total CHOL/HDL Ratio: 4
Triglycerides: 227 mg/dL — ABNORMAL HIGH (ref 0.0–149.0)
VLDL: 45.4 mg/dL — ABNORMAL HIGH (ref 0.0–40.0)

## 2019-09-09 LAB — LDL CHOLESTEROL, DIRECT: Direct LDL: 116 mg/dL

## 2019-09-09 NOTE — Patient Instructions (Addendum)
Give [place] 2-3 business days to get the results of your labs back.   Keep the diet clean and stay active.  Stop chewing gum, drinking carbonated beverages, gulping liquids, and drinking alcohol to help with belching.  These foods may cause you to belch more: Wheat, barley, rye, onion, leek, white part of spring onion, garlic, shallots, artichokes, beetroot, fennel, peas, chicory, pistachio, cashews, legumes, lentils, and chickpeas; Milk, custard, ice cream, and yogurt; Apples, pears, mangoes, cherries, watermelon, asparagus, sugar snap peas, honey, high-fructose corn syrup; Apricots, nectarines, peaches, plums, mushrooms, cauliflower, artificially sweetened chewing gum and confectionery  Let us know if you need anything.   Gluteus Medius Syndrome Rehab It is normal to feel mild stretching, pulling, tightness, or discomfort as you do these exercises, but you should stop right away if you feel sudden pain or your pain gets worse.   Stretching and range of motion exercise This exercise warms up your muscles and joints and improves the movement and flexibility of your hip and pelvis. This exercise also helps to relieve pain and stiffness. Exercise A: Lunge (hip flexor stretch)     1. Kneel on the floor on your left / right knee. Bend your other knee so it is directly over your ankle. 2. Keep good posture with your head over your shoulders. Tuck your tailbone underneath you. This will prevent your back from arching too much. 3. You should feel a gentle stretch in the front of your thigh or hip. If you do not feel a stretch, slowly lunge forward with your chest up. 4. Hold this position for 30 seconds. 5. Slowly return to the starting position. Repeat 2 times. Complete this exercise 3 times per week. Strengthening exercises These exercises build strength and endurance in your hip and pelvis. Endurance is the ability to use your muscles for a long time, even after they get tired. Exercise B: Bridge  (hip extensors)    1. Lie on your back on a firm surface with your knees bent and your feet flat on the floor. 2. Tighten your buttocks muscles and lift your bottom off the floor until the trunk of your body is level with your thighs. ? You should feel the muscles working in your buttocks and the back of your thighs. If this exercise is too easy, cross your arms over your chest or lift one leg while your bottom is up off the floor. ? Do not arch your back. 3. Hold this position for 3 seconds. 4. Slowly lower your hips to the starting position. 5. Let your muscles relax completely between repetitions. Repeat 2 times. Complete this exercise 3 times per week. Exercise C: Straight leg raises (hip abductors)    1. Lie on your side with your left / right leg in the top position. Lie so your head, shoulder, knee, and hip line up. Bend your bottom knee to help you balance. 2. Lift your top leg up 4-6 inches (10-15 cm), keeping your toes pointed straight ahead. 3. Hold this position for 2 seconds. 4. Slowly lower your leg to the starting position and let your muscles relax completely. Repeat for a total of 10 repetitions. Repeat 2 times. Complete this exercise 3 times per week. Exercise D: Hip abductors and external rotators, quadruped 1. Get on your hands and knees on a firm, lightly padded surface. Your hands should be directly below your shoulders, and your knees should be directly below your hips. 2. Lift your left / right knee out to the  side. Keep your knee bent. Do not twist your body. 3. Hold this position for 3 seconds. 4. Slowly lower your leg. Repeat for a total of 10 repetitions.  Repeat 2 times. Complete this exercise 3 times per week. Exercise E: Single leg stand 1. Stand near a counter or door frame to hold onto as needed. It is helpful to look in a mirror for this exercise so you can watch your hip. 2. Squeeze your left / right buttock muscles then lift up your other foot. Do not  let your left / righthip push out to the side. 3. Hold this position for 3 seconds. Repeat for a total of 10 repetitions. Repeat 2 times. Complete this exercise 3 times per week. Make sure you discuss any questions you have with your health care provider. Document Released: 03/06/2005 Document Revised: 11/11/2015 Document Reviewed: 02/16/2015 Elsevier Interactive Patient Education  [employer].

## 2019-09-09 NOTE — Progress Notes (Signed)
Chief Complaint  Patient presents with  . 6 month follow up    Subjective Austin Lloyd is a 86 y.o. male who presents for hypertension follow up. Here w his spouse.  He does not monitor home blood pressures. He is compliant with medications- losartan 25 mg/d, Toprol XL 25 mg/d. Patient has these side effects of medication: none He is sometimes adhering to a healthy diet overall. Current exercise: walking  Hyperlipidemia Patient presents for dyslipidemia follow up. Currently being treated with Mevacor 40 mg/d and compliance with treatment thus far has been good. He denies myalgias. Diet/exercise as above The patient is known to have coexisting coronary artery disease.  Patient has been having outer buttock pain after he finishes walking.  No bruising, redness, swelling, or weakness.  He has not tried anything so far.   Past Medical History:  Diagnosis Date  . Aortic stenosis   . Bladder cancer ([place])    T1 status post BCG  . 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 Coronary artery disease with history of myocardial infarction without history of CABG    LIMA to the LAD, SVG to RCA 2016 for treatment of the percent left main stenosis and 80% RCA stenosis.    . Depression   . Diabetes mellitus, type II ([name])   . Hypertension   . Ischemic cardiomyopathy    EF 40% 2019  . Lung cancer ([place])    Adenocarcinoma of the right lung moderately differentiated  . PVC's (premature ventricular contractions)   . S/P TAVR (transcatheter aortic valve replacement)     Exam BP 110/70 (BP Location: Left Arm, Cuff Size: Normal)   Pulse 68   Temp 97.9 F (36.6 C) (Temporal)   Resp 12   Ht 5\' 4"  (1.626 m)   Wt 152 lb 12.8 oz (69.3 kg)   SpO2 93%   BMI 26.23 kg/m  General:  well developed, well nourished, in no apparent distress Heart: RRR, no bruits, 2+ bilateral LE edema Lungs: clear to auscultation, no accessory  muscle use MSK: Mild tenderness to palpation over the lateral glut muscles, not over the trochanteric bursa bilaterally Psych: well oriented with normal range of affect and appropriate judgment/insight  Essential hypertension  Hyperlipidemia, unspecified hyperlipidemia type - Plan: Lipid panel  Tendinopathy of left gluteus medius  Tendinopathy of right gluteus medius  1- Counseled on diet and exercise.  Continue current medication. 2-continue care, check lipids today 3/4-stretches and exercises, heat, refer to PT if no improvement. F/u in 6 mo or prn. The patient voiced understanding and agreement to the plan.  [place], DO 09/09/19  12:15 PM

## 2019-09-10 ENCOUNTER — Other Ambulatory Visit: Payer: Self-pay | Admitting: Family Medicine

## 2019-09-10 DIAGNOSIS — E785 Hyperlipidemia, unspecified: Secondary | ICD-10-CM

## 2019-09-12 LAB — HM DIABETES EYE EXAM

## 2019-09-15 ENCOUNTER — Ambulatory Visit (INDEPENDENT_AMBULATORY_CARE_PROVIDER_SITE_OTHER): Payer: Medicare Other | Admitting: Endocrinology

## 2019-09-15 ENCOUNTER — Other Ambulatory Visit: Payer: Self-pay

## 2019-09-15 ENCOUNTER — Encounter: Payer: Self-pay | Admitting: Endocrinology

## 2019-09-15 VITALS — BP 122/80 | HR 75 | Ht 64.0 in | Wt 153.0 lb

## 2019-09-15 DIAGNOSIS — I2510 Atherosclerotic heart disease of native coronary artery without angina pectoris: Secondary | ICD-10-CM

## 2019-09-15 DIAGNOSIS — E119 Type 2 diabetes mellitus without complications: Secondary | ICD-10-CM

## 2019-09-15 DIAGNOSIS — Z794 Long term (current) use of insulin: Secondary | ICD-10-CM

## 2019-09-15 LAB — POCT GLYCOSYLATED HEMOGLOBIN (HGB A1C): Hemoglobin A1C: 6.5 % — AB (ref 4.0–5.6)

## 2019-09-15 MED ORDER — LEVEMIR FLEXTOUCH 100 UNIT/ML ~~LOC~~ SOPN: 15.0000 [IU] | PEN_INJECTOR | SUBCUTANEOUS | 6 refills | Status: DC

## 2019-09-15 NOTE — Patient Instructions (Addendum)
check your blood sugar twice a day.  vary the time of day when you check, between before the 3 meals, and at bedtime.  also check if you have symptoms of your blood sugar being too high or too low.  please keep a record of the readings and bring it to your next appointment here (or you can bring the meter itself).  You can write it on any piece of paper.  please call us sooner if your blood sugar goes below 70, or if you have a lot of readings over 200.   Please reduce the Levemir to 15 units each morning.   Please come back for a follow-up appointment in 2 months.

## 2019-09-15 NOTE — Progress Notes (Signed)
Subjective:    Patient ID: Austin Lloyd, male    DOB: March 23, 1929, 86 y.o.   MRN: [id number]  HPI Pt returns for f/u of diabetes mellitus: DM type: Insulin-requiring type 2 Dx'ed: 9798 Complications: CAD and CRI Therapy: insulin since soon after dx DKA: never Severe hypoglycemia: last episode was aprox 2015 Pancreatitis: never Pancreatic imaging: normal on 2019 CT.   SDOH: none Other: frail elderly state: he is not a candidate for aggressive glycemic control, or for multiple daily injections Interval history: He brings a record of his cbg's which I have reviewed today.  cbg varies from 80-190.  There is no trend throughout the day.  pt states he feels well in general.  He takes 20 units qam.   Past Medical History:  Diagnosis Date  . Aortic stenosis   . Bladder cancer ([place])    T1 status post BCG  . 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 Coronary artery disease with history of myocardial infarction without history of CABG    LIMA to the LAD, SVG to RCA 2016 for treatment of the percent left main stenosis and 80% RCA stenosis.    . Depression   . Diabetes mellitus, type II ([place])   . Hypertension   . Ischemic cardiomyopathy    EF 40% 2019  . Lung cancer ([place])    Adenocarcinoma of the right lung moderately differentiated  . PVC's (premature ventricular contractions)   . S/P TAVR (transcatheter aortic valve replacement)     Past Surgical History:  Procedure Laterality Date  . CORONARY ARTERY BYPASS GRAFT     2014  . INGUINAL HERNIA REPAIR    . INTRAOPERATIVE TRANSTHORACIC ECHOCARDIOGRAM  02/05/2018   Procedure: INTRAOPERATIVE TRANSTHORACIC ECHOCARDIOGRAM;  Surgeon: [name], MD;  Location: [facility] OR;  Service: Open Heart Surgery;;  . LUNG REMOVAL, PARTIAL    . RIGHT/LEFT HEART CATH AND CORONARY/GRAFT ANGIOGRAPHY N/A 12/21/2017   Procedure: RIGHT/LEFT HEART CATH AND CORONARY/GRAFT  ANGIOGRAPHY;  Surgeon: [name], MD;  Location: [place] [facility];  Service: Cardiovascular;  Laterality: N/A;  . TRANSCATHETER AORTIC VALVE REPLACEMENT, TRANSFEMORAL  02/05/2018   TRANSCATHETER AORTIC VALVE REPLACEMENT, TRANSFEMORAL (  . TRANSCATHETER AORTIC VALVE REPLACEMENT, TRANSFEMORAL N/A 02/05/2018   Procedure: TRANSCATHETER AORTIC VALVE REPLACEMENT, TRANSFEMORAL;  Surgeon: [name], MD;  Location: [place];  Service: Open Heart Surgery;  Laterality: N/A;  . Ulcer surgery      Social History   Socioeconomic History  . Marital status: Married    Spouse name: Not on file  . Number of children: 3  . Years of education: Not on file  . Highest education level: Not on file  Occupational History  . 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 Smoking status: Former Smoker    Packs/day: 0.20    Years: 30.00    Pack years: 6.00    Types: Cigarettes  . Smokeless tobacco: Never Used  Vaping Use  . Vaping Use: Never used  Substance and Sexual Activity  . Alcohol use: Yes    Alcohol/week: 2.0 standard drinks    Types: 2 Cans of beer per week    Comment: 2 cans beer per week.  . Drug use: Never  . Sexual activity: Not on file  Other Topics Concern  . Not on file  Social History Narrative   Lives with wife daughter.  Three children and one  grandchild.    Social Determinants of Health   Financial Resource Strain:   . Difficulty of Paying Living Expenses:   Food Insecurity:   . Worried About Charity fundraiser in the Last Year:   . Arboriculturist in the Last Year:   Transportation Needs:   . Film/video editor (Medical):   Marland Kitchen Lack of Transportation (Non-Medical):   Physical Activity:   . Days of Exercise per Week:   . Minutes of Exercise per Session:   Stress:   . Feeling of Stress :   Social Connections:   . Frequency of Communication with Friends and Family:   . Frequency of Social Gatherings with  Friends and Family:   . Attends Religious Services:   . Active Member of Clubs or Organizations:   . Attends Archivist Meetings:   Marland Kitchen Marital Status:   Intimate Partner Violence:   . Fear of Current or Ex-Partner:   . Emotionally Abused:   Marland Kitchen Physically Abused:   . Sexually Abused:     Current Outpatient Medications on File Prior to Visit  Medication Sig Dispense Refill  . ACCU-CHEK AVIVA PLUS test strip TEST BLOOD SUGAR THREE TIMES A DAY. DX E11.9 200 strip 6  . Accu-Chek Softclix Lancets lancets TEST BLOOD SUGAR THREE TIMES A DAY. DX E11.9 200 each 6  . aspirin EC 81 MG tablet Take 81 mg by mouth every evening.     . ferrous sulfate 325 (65 FE) MG tablet Take 325 mg by mouth daily with breakfast.    . furosemide (LASIX) 40 MG tablet TAKE 1 TABLET BY MOUTH EVERY DAY 90 tablet 3  . levocetirizine (XYZAL) 5 MG tablet Take 1 tablet (5 mg total) by mouth every evening. 90 tablet 2  . losartan (COZAAR) 25 MG tablet Take 25 mg by mouth daily.    Marland Kitchen lovastatin (MEVACOR) 40 MG tablet TAKE 1 TABLET BY MOUTH EVERYDAY AT BEDTIME 90 tablet 1  . metoprolol succinate (TOPROL-XL) 25 MG 24 hr tablet TAKE 1 TABLET BY MOUTH TWICE A DAY 180 tablet 1  . Multiple Vitamin (MULTIVITAMIN) tablet Take 1 tablet by mouth daily.    . tamsulosin (FLOMAX) 0.4 MG CAPS capsule TAKE 1 CAPSULE BY MOUTH EVERY DAY 90 capsule 3   No current facility-administered medications on file prior to visit.    No Known Allergies  Family History  Problem Relation Age of Onset  . AAA (abdominal aortic aneurysm) Mother   . Cancer Father        Brain tumor  . Diabetes Neg Hx     BP 122/80   Pulse 75   Ht 5\' 4"  (1.626 m)   Wt 153 lb (69.4 kg)   SpO2 94%   BMI 26.26 kg/m    Review of Systems He denies hypoglycemia    Objective:   Physical Exam VITAL SIGNS:  See vs page GENERAL: no distress Pulses: dorsalis pedis intact bilat.   MSK: no deformity of the feet CV: 3+ bilat leg edema Skin:  no ulcer on the  feet.  normal color and temp on the feet.  Old healed surgical scars on both legs (vein harvest) Neuro: sensation is intact to touch on the feet  Lab Results  Component Value Date   HGBA1C 6.5 (A) 09/15/2019   Lab Results  Component Value Date   CREATININE 1.70 (H) 08/04/2019   BUN 41 (H) 08/04/2019   NA 140 08/04/2019   K 5.0 08/04/2019  CL 104 08/04/2019   CO2 26 08/04/2019        Assessment & Plan:  Insulin-requiring type 2 DM, with CAD: overcontrolled Frail elderly state.  He is not a candidate for aggressive glycemic control.   CRI: he is at risk for fasting hypoglycemia, so he should not have a slower time-release QAM insulin.    Patient Instructions  check your blood sugar twice a day.  vary the time of day when you check, between before the 3 meals, and at bedtime.  also check if you have symptoms of your blood sugar being too high or too low.  please keep a record of the readings and bring it to your next appointment here (or you can bring the meter itself).  You can write it on any piece of paper.  please call us sooner if your blood sugar goes below 70, or if you have a lot of readings over 200.   Please reduce the Levemir to 15 units each morning.   Please come back for a follow-up appointment in 2 months.

## 2019-09-29 DIAGNOSIS — C44612 Basal cell carcinoma of skin of right upper limb, including shoulder: Secondary | ICD-10-CM | POA: Diagnosis not present

## 2019-10-17 ENCOUNTER — Other Ambulatory Visit (INDEPENDENT_AMBULATORY_CARE_PROVIDER_SITE_OTHER): Payer: Medicare Other

## 2019-10-17 ENCOUNTER — Other Ambulatory Visit: Payer: Self-pay

## 2019-10-17 DIAGNOSIS — E785 Hyperlipidemia, unspecified: Secondary | ICD-10-CM | POA: Diagnosis not present

## 2019-10-17 LAB — LIPID PANEL
Cholesterol: 188 mg/dL (ref 0–200)
HDL: 45.8 mg/dL (ref >39.00)
LDL Cholesterol: 113 mg/dL — ABNORMAL HIGH (ref 0–99)
NonHDL: 142.45
Total CHOL/HDL Ratio: 4
Triglycerides: 147 mg/dL (ref 0.0–149.0)
VLDL: 29.4 mg/dL (ref 0.0–40.0)

## 2019-11-17 ENCOUNTER — Ambulatory Visit (INDEPENDENT_AMBULATORY_CARE_PROVIDER_SITE_OTHER): Payer: Medicare Other | Admitting: Endocrinology

## 2019-11-17 ENCOUNTER — Other Ambulatory Visit: Payer: Self-pay

## 2019-11-17 ENCOUNTER — Encounter: Payer: Self-pay | Admitting: Endocrinology

## 2019-11-17 VITALS — BP 120/80 | HR 78 | Ht 64.0 in | Wt 150.0 lb

## 2019-11-17 DIAGNOSIS — Z794 Long term (current) use of insulin: Secondary | ICD-10-CM

## 2019-11-17 DIAGNOSIS — I2510 Atherosclerotic heart disease of native coronary artery without angina pectoris: Secondary | ICD-10-CM | POA: Diagnosis not present

## 2019-11-17 DIAGNOSIS — E119 Type 2 diabetes mellitus without complications: Secondary | ICD-10-CM

## 2019-11-17 LAB — POCT GLYCOSYLATED HEMOGLOBIN (HGB A1C): Hemoglobin A1C: 7 % — AB (ref 4.0–5.6)

## 2019-11-17 MED ORDER — LEVEMIR FLEXTOUCH 100 UNIT/ML ~~LOC~~ SOPN: 14.0000 [IU] | PEN_INJECTOR | SUBCUTANEOUS | 6 refills | Status: DC

## 2019-11-17 NOTE — Progress Notes (Signed)
Subjective:    Patient ID: Austin Lloyd, male    DOB: [birth year], 86 y.o.   MRN: [id number]  HPI Pt returns for f/u of diabetes mellitus: DM type: Insulin-requiring type 2 Dx'ed: 8366 Complications: CAD and CRI Therapy: insulin since soon after dx DKA: never Severe hypoglycemia: last episode was aprox 2015 Pancreatitis: never Pancreatic imaging: normal on 2019 CT.   SDOH: none Other: frail elderly state: he is not a candidate for aggressive glycemic control, or for multiple daily injections.  Interval history: He brings a record of his cbg's which I have reviewed today.  cbg varies from 88-219.  There is no trend throughout the day.  pt states he feels well in general.  Past Medical History:  Diagnosis Date  . Aortic stenosis   . Bladder cancer ([place])    T1 status post BCG  . 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 Coronary artery disease with history of myocardial infarction without history of CABG    LIMA to the LAD, SVG to RCA 2016 for treatment of the percent left main stenosis and 80% RCA stenosis.    . Depression   . Diabetes mellitus, type II ([place])   . Hypertension   . Ischemic cardiomyopathy    EF 40% 2019  . Lung cancer ([place])    Adenocarcinoma of the right lung moderately differentiated  . PVC's (premature ventricular contractions)   . S/P TAVR (transcatheter aortic valve replacement)     Past Surgical History:  Procedure Laterality Date  . CORONARY ARTERY BYPASS GRAFT     2014  . INGUINAL HERNIA REPAIR    . INTRAOPERATIVE TRANSTHORACIC ECHOCARDIOGRAM  02/05/2018   Procedure: INTRAOPERATIVE TRANSTHORACIC ECHOCARDIOGRAM;  Surgeon: [name], MD;  Location: [facility] OR;  Service: Open Heart Surgery;;  . LUNG REMOVAL, PARTIAL    . RIGHT/LEFT HEART CATH AND CORONARY/GRAFT ANGIOGRAPHY N/A 12/21/2017   Procedure: RIGHT/LEFT HEART CATH AND CORONARY/GRAFT ANGIOGRAPHY;  Surgeon: [name], MD;  Location: [place] [facility];  Service: Cardiovascular;  Laterality: N/A;  . TRANSCATHETER AORTIC VALVE REPLACEMENT, TRANSFEMORAL  02/05/2018   TRANSCATHETER AORTIC VALVE REPLACEMENT, TRANSFEMORAL (  . TRANSCATHETER AORTIC VALVE REPLACEMENT, TRANSFEMORAL N/A 02/05/2018   Procedure: TRANSCATHETER AORTIC VALVE REPLACEMENT, TRANSFEMORAL;  Surgeon: [name], MD;  Location: [place];  Service: Open Heart Surgery;  Laterality: N/A;  . Ulcer surgery      Social History   Socioeconomic History  . Marital status: Married    Spouse name: Not on file  . Number of children: 3  . Years of education: Not on file  . Highest education level: Not on file  Occupational History  . 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 Smoking status: Former Smoker    Packs/day: 0.20    Years: 30.00    Pack years: 6.00    Types: Cigarettes  . Smokeless tobacco: Never Used  Vaping Use  . Vaping Use: Never used  Substance and Sexual Activity  . Alcohol use: Yes    Alcohol/week: 2.0 standard drinks    Types: 2 Cans of beer per week    Comment: 2 cans beer per week.  . Drug use: Never  . Sexual activity: Not on file  Other Topics Concern  . Not on file  Social History Narrative   Lives with wife daughter.  Three children and one grandchild.    Social Determinants  of Health   Financial Resource Strain:   . Difficulty of Paying Living Expenses: Not on file  Food Insecurity:   . Worried About Charity fundraiser in the Last Year: Not on file  . Ran Out of Food in the Last Year: Not on file  Transportation Needs:   . Lack of Transportation (Medical): Not on file  . Lack of Transportation (Non-Medical): Not on file  Physical Activity:   . Days of Exercise per Week: Not on file  . Minutes of Exercise per Session: Not on file  Stress:   . Feeling of Stress : Not on file  Social Connections:   . Frequency of Communication with Friends  and Family: Not on file  . Frequency of Social Gatherings with Friends and Family: Not on file  . Attends Religious Services: Not on file  . Active Member of Clubs or Organizations: Not on file  . Attends Archivist Meetings: Not on file  . Marital Status: Not on file  Intimate Partner Violence:   . Fear of Current or Ex-Partner: Not on file  . Emotionally Abused: Not on file  . Physically Abused: Not on file  . Sexually Abused: Not on file    Current Outpatient Medications on File Prior to Visit  Medication Sig Dispense Refill  . ACCU-CHEK AVIVA PLUS test strip TEST BLOOD SUGAR THREE TIMES A DAY. DX E11.9 200 strip 6  . Accu-Chek Softclix Lancets lancets TEST BLOOD SUGAR THREE TIMES A DAY. DX E11.9 200 each 6  . aspirin EC 81 MG tablet Take 81 mg by mouth every evening.     . ferrous sulfate 325 (65 FE) MG tablet Take 325 mg by mouth daily with breakfast.    . furosemide (LASIX) 40 MG tablet TAKE 1 TABLET BY MOUTH EVERY DAY 90 tablet 3  . levocetirizine (XYZAL) 5 MG tablet Take 1 tablet (5 mg total) by mouth every evening. 90 tablet 2  . losartan (COZAAR) 25 MG tablet Take 25 mg by mouth daily.    Marland Kitchen lovastatin (MEVACOR) 40 MG tablet TAKE 1 TABLET BY MOUTH EVERYDAY AT BEDTIME 90 tablet 1  . metoprolol succinate (TOPROL-XL) 25 MG 24 hr tablet TAKE 1 TABLET BY MOUTH TWICE A DAY 180 tablet 1  . Multiple Vitamin (MULTIVITAMIN) tablet Take 1 tablet by mouth daily.    . tamsulosin (FLOMAX) 0.4 MG CAPS capsule TAKE 1 CAPSULE BY MOUTH EVERY DAY 90 capsule 3   No current facility-administered medications on file prior to visit.    No Known Allergies  Family History  Problem Relation Age of Onset  . AAA (abdominal aortic aneurysm) Mother   . Cancer Father        Brain tumor  . Diabetes Neg Hx     BP 120/80   Pulse 78   Ht 5\' 4"  (1.626 m)   Wt 150 lb (68 kg)   SpO2 92%   BMI 25.75 kg/m   Review of Systems He denies hypoglycemia    Objective:   Physical Exam VITAL  SIGNS:  See vs page GENERAL: no distress Pulses: dorsalis pedis intact bilat.   MSK: no deformity of the feet CV: 2+ bilat leg edema Skin:  no ulcer on the feet.  normal color and temp on the feet.  Old healed surgical scars on both legs (vein harvest) Neuro: sensation is intact to touch on the feet   Lab Results  Component Value Date   HGBA1C 7.0 (A) 11/17/2019  Assessment & Plan:  Insulin-requiring type 2 DM: overcontrolled.    Patient Instructions  check your blood sugar twice a day.  vary the time of day when you check, between before the 3 meals, and at bedtime.  also check if you have symptoms of your blood sugar being too high or too low.  please keep a record of the readings and bring it to your next appointment here (or you can bring the meter itself).  You can write it on any piece of paper.  please call us sooner if your blood sugar goes below 70, or if you have a lot of readings over 200.   Please reduce the Levemir to 14 units each morning.   Please come back for a follow-up appointment in 2 months.

## 2019-11-17 NOTE — Patient Instructions (Signed)
check your blood sugar twice a day.  vary the time of day when you check, between before the 3 meals, and at bedtime.  also check if you have symptoms of your blood sugar being too high or too low.  please keep a record of the readings and bring it to your next appointment here (or you can bring the meter itself).  You can write it on any piece of paper.  please call us sooner if your blood sugar goes below 70, or if you have a lot of readings over 200.   Please reduce the Levemir to 14 units each morning.   Please come back for a follow-up appointment in 2 months.

## 2019-11-21 DIAGNOSIS — L602 Onychogryphosis: Secondary | ICD-10-CM | POA: Diagnosis not present

## 2019-11-21 DIAGNOSIS — E1159 Type 2 diabetes mellitus with other circulatory complications: Secondary | ICD-10-CM | POA: Diagnosis not present

## 2019-11-21 DIAGNOSIS — L84 Corns and callosities: Secondary | ICD-10-CM | POA: Diagnosis not present

## 2019-12-11 ENCOUNTER — Other Ambulatory Visit: Payer: Medicare Other

## 2019-12-11 DIAGNOSIS — Z20822 Contact with and (suspected) exposure to covid-19: Secondary | ICD-10-CM

## 2019-12-13 LAB — NOVEL CORONAVIRUS, NAA: SARS-CoV-2, NAA: NOT DETECTED

## 2019-12-13 LAB — SARS-COV-2, NAA 2 DAY TAT

## 2019-12-15 DIAGNOSIS — Z23 Encounter for immunization: Secondary | ICD-10-CM | POA: Diagnosis not present

## 2019-12-28 ENCOUNTER — Other Ambulatory Visit: Payer: Self-pay | Admitting: Family Medicine

## 2019-12-31 ENCOUNTER — Other Ambulatory Visit: Payer: Self-pay

## 2019-12-31 MED ORDER — LOSARTAN POTASSIUM 25 MG PO TABS: 25.0000 mg | ORAL_TABLET | Freq: Every day | ORAL | 3 refills | Status: AC

## 2020-01-09 ENCOUNTER — Other Ambulatory Visit: Payer: Self-pay

## 2020-01-09 ENCOUNTER — Ambulatory Visit (HOSPITAL_COMMUNITY)
Admission: RE | Admit: 2020-01-09 | Discharge: 2020-01-09 | Disposition: A | Discharge status: Home / Self Care | Payer: Medicare Other | Source: Ambulatory Visit | Attending: Hematology | Admitting: Hematology

## 2020-01-09 ENCOUNTER — Other Ambulatory Visit: Payer: Self-pay | Admitting: Hematology

## 2020-01-09 DIAGNOSIS — C9110 Chronic lymphocytic leukemia of B-cell type not having achieved remission: Secondary | ICD-10-CM | POA: Diagnosis not present

## 2020-01-09 DIAGNOSIS — C3400 Malignant neoplasm of unspecified main bronchus: Secondary | ICD-10-CM

## 2020-01-09 LAB — POCT I-STAT CREATININE: Creatinine, Ser: 1.8 mg/dL — ABNORMAL HIGH (ref 0.61–1.24)

## 2020-01-21 DIAGNOSIS — I493 Ventricular premature depolarization: Secondary | ICD-10-CM | POA: Insufficient documentation

## 2020-01-21 NOTE — Progress Notes (Signed)
Cardiology Office Note   Date:  01/22/2020   ID:  Austin Lloyd, DOB 1929/10/18, MRN [id number]  PCP:  Shelda Pal, DO  Cardiologist:   [name], MD   Chief Complaint  Patient presents with  . Coronary Artery Disease      History of Present Illness: Austin Lloyd is a 86 y.o. male who presents for evaluation of CAD s/p 2V CABG in 2016 in [place].  He had ischemic cardiomyopathy.  He had severe AS and had successful TAVR with a99mm Edwards Sapien 3 THV via the TF approach on 02/05/18. Post operative echoshowed EF 40-45%, normally functioning TAVR with trivial PVL; mean gradient 8 mm Hg.  However, follow-up echo in December demonstrated that the EF was now reduced at 25 to 30%.  He had normal valve function.  He had a repeat echo in Nov and the EF was unchanged at 30 - 35%.  He had stable TAVR.   Since he was last seen he has done well.  He walks 2 miles daily and takes an hour.  He denies any cardiovascular symptoms with this. The patient denies any new symptoms such as chest discomfort, neck or arm discomfort. There has been no new shortness of breath, PND or orthopnea. There have been no reported palpitations, presyncope or syncope.  He is moving back to [place].  Past Medical History:  Diagnosis Date  . Aortic stenosis   . Bladder cancer ([place])    T1 status post BCG  . 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 Coronary artery disease with history of myocardial infarction without history of CABG    LIMA to the LAD, SVG to RCA 2016 for treatment of the percent left main stenosis and 80% RCA stenosis.    . Depression   . Diabetes mellitus, type II ([place])   . Hypertension   . Ischemic cardiomyopathy    EF 40% 2019  . Lung cancer ([place])    Adenocarcinoma of the right lung moderately differentiated  . PVC's (premature ventricular contractions)   . S/P TAVR (transcatheter aortic  valve replacement)     Past Surgical History:  Procedure Laterality Date  . CORONARY ARTERY BYPASS GRAFT     2014  . INGUINAL HERNIA REPAIR    . INTRAOPERATIVE TRANSTHORACIC ECHOCARDIOGRAM  02/05/2018   Procedure: INTRAOPERATIVE TRANSTHORACIC ECHOCARDIOGRAM;  Surgeon: [name], MD;  [facility];  Service: Open Heart Surgery;;  . LUNG REMOVAL, PARTIAL    . RIGHT/LEFT HEART CATH AND CORONARY/GRAFT ANGIOGRAPHY N/A 12/21/2017   Procedure: RIGHT/LEFT HEART CATH AND CORONARY/GRAFT ANGIOGRAPHY;  Surgeon: [name], MD;  Location: [place] [facility] LAB;  Service: Cardiovascular;  Laterality: N/A;  . TRANSCATHETER AORTIC VALVE REPLACEMENT, TRANSFEMORAL  02/05/2018   TRANSCATHETER AORTIC VALVE REPLACEMENT, TRANSFEMORAL (  . TRANSCATHETER AORTIC VALVE REPLACEMENT, TRANSFEMORAL N/A 02/05/2018   Procedure: TRANSCATHETER AORTIC VALVE REPLACEMENT, TRANSFEMORAL;  Surgeon: [name], MD;  Location: [place];  Service: Open Heart Surgery;  Laterality: N/A;  . Ulcer surgery       Current Outpatient Medications  Medication Sig Dispense Refill  . ACCU-CHEK AVIVA PLUS test strip TEST BLOOD SUGAR THREE TIMES A DAY. DX E11.9 (Patient taking differently: Pt test blood sugar twice a day) 200 strip 6  . Accu-Chek Softclix Lancets lancets TEST BLOOD SUGAR THREE TIMES A DAY. DX E11.9 200 each 6  . aspirin EC 81 MG tablet Take 81 mg by  mouth every evening.     . ferrous sulfate 325 (65 FE) MG tablet Take 325 mg by mouth daily with breakfast.    . furosemide (LASIX) 40 MG tablet TAKE 1 TABLET BY MOUTH EVERY DAY 90 tablet 3  . insulin detemir (LEVEMIR FLEXTOUCH) 100 UNIT/ML FlexPen Inject 14 Units into the skin every morning. (Patient taking differently: Inject 14 Units into the skin every morning. Pt injects 15 units every morning) 15 mL 6  . levocetirizine (XYZAL) 5 MG tablet Take 1 tablet (5 mg total) by mouth every evening. 90 tablet 2  . losartan (COZAAR) 25 MG tablet Take  1 tablet (25 mg total) by mouth daily. 90 tablet 3  . lovastatin (MEVACOR) 40 MG tablet TAKE 1 TABLET BY MOUTH EVERYDAY AT BEDTIME 90 tablet 1  . metoprolol succinate (TOPROL-XL) 25 MG 24 hr tablet TAKE 1 TABLET BY MOUTH TWICE A DAY 180 tablet 1  . Multiple Vitamin (MULTIVITAMIN) tablet Take 1 tablet by mouth daily.    . tamsulosin (FLOMAX) 0.4 MG CAPS capsule TAKE 1 CAPSULE BY MOUTH EVERY DAY 90 capsule 3   No current facility-administered medications for this visit.    Allergies:   Patient has no known allergies.    ROS:  Please see the history of present illness.   Otherwise, review of systems are positive for none.   All other systems are reviewed and negative.    PHYSICAL EXAM: VS:  BP 122/90 (BP Location: Left Arm, Patient Position: Sitting)   Pulse 65   Ht 5\' 4"  (1.626 m)   Wt 146 lb 3.2 oz (66.3 kg)   SpO2 97%   BMI 25.10 kg/m  , BMI Body mass index is 25.1 kg/m. GENERAL:  Well appearing for his age NECK:  No jugular venous distention, waveform within normal limits, carotid upstroke brisk and symmetric, no bruits, no thyromegaly LUNGS:  Clear to auscultation bilaterally CHEST:  Unremarkable HEART:  PMI not displaced or sustained,S1 and S2 within normal limits, no S3, no S4, no clicks, no rubs, soft apical brief systolic murmur radiating slightly at the aortic outflow tract, no diastolic murmurs ABD:  Flat, positive bowel sounds normal in frequency in pitch, no bruits, no rebound, no guarding, no midline pulsatile mass, no hepatomegaly, no splenomegaly EXT:  2 plus pulses throughout, no edema, no cyanosis no clubbing      EKG:  EKG is  ordered today. The ekg ordered today demonstrates sinus rhythm, rate 65, axis within normal limits, borderline interventricular conduction delay, premature ventricular contractions    Recent Labs: 08/04/2019: ALT 28; BUN 41; Hemoglobin 13.0; Platelets 176; Potassium 5.0; Sodium 140 01/09/2020: Creatinine, Ser 1.80    Lipid Panel      Component Value Date/Time   CHOL 188 10/17/2019 1032   CHOL 183 11/06/2017 0815   TRIG 147.0 10/17/2019 1032   HDL 45.80 10/17/2019 1032   HDL 58 11/06/2017 0815   CHOLHDL 4 10/17/2019 1032   VLDL 29.4 10/17/2019 1032   LDLCALC 113 (H) 10/17/2019 1032   LDLCALC 108 (H) 11/06/2017 0815   LDLDIRECT 116.0 09/09/2019 1216      Wt Readings from Last 3 Encounters:  01/22/20 146 lb 3.2 oz (66.3 kg)  11/17/19 150 lb (68 kg)  09/15/19 153 lb (69.4 kg)      Other studies Reviewed: Additional studies/ records that were reviewed today include: labs. Review of the above records demonstrates:  Please see elsewhere in the note.     ASSESSMENT AND PLAN:  Severe AS s/p TAVR:  He is up-to-date with an echocardiogram last year.  We will need to establish with a cardiologist back in [place].  No further imaging is indicated at this time.   Chronic combined S/D CHF:      EF is 30 to 35%.  This was slightly improved over serial echocardiograms.  He seems to be euvolemic.  No change in therapy.   CKD stage III:   Creat was 1.8.  This has been stable.  No change in therapy.  HTN:  The blood pressure runs low generally.  He does not tolerate med titration.  No change in therapy.    CAD s/p CABG:    He has no active angina.  He will continue with risk reduction.  Bigeminy:    He has no symptoms related to his ventricular ectopy.  No change in therapy.    Current medicines are reviewed at length with the patient today.  The patient does not have concerns regarding medicines.  The following changes have been made: None  Labs/ tests ordered today include:   None  No orders of the defined types were placed in this encounter.    Disposition:   FU with will be in [place] apparently.   Signed, [name], MD  01/22/2020 1:00 PM    [facility]  ADDENDUM:   Patient is 1 year s/p TAVR.  [facility] Cardiomyopathy Questionnaire  [id number] 01/27/2019  1 a.  Ability to shower/bathe Not at all limited  1 b. Ability to walk 1 block Slightly limited  1 c. Ability to hurry/jog Other, Did not do  2. Edema feet/ankles/legs Never over the past 2 weeks  3. Limited by fatigue At least once a day  4. Limited by dyspnea 3+ times a week, not every day  5. Sitting up / on 3+ pillows Never over the past 2 weeks  6. Limited enjoyment of life Slightly limited  7. Rest of life w/ symptoms Mostly satisfied  8 a. Participation in hobbies N/A, did not do for other reasons  8 b. Participation in chores Slightly limited  8 c. Visiting family/friends N/A, did not do for other reasons    He has NYHA class II symptoms.   Angelena Form PA-C  [facility]

## 2020-01-22 ENCOUNTER — Other Ambulatory Visit: Payer: Self-pay

## 2020-01-22 ENCOUNTER — Ambulatory Visit (INDEPENDENT_AMBULATORY_CARE_PROVIDER_SITE_OTHER): Payer: Medicare Other | Admitting: Cardiology

## 2020-01-22 ENCOUNTER — Encounter: Payer: Self-pay | Admitting: Cardiology

## 2020-01-22 VITALS — BP 122/90 | HR 65 | Ht 64.0 in | Wt 146.2 lb

## 2020-01-22 DIAGNOSIS — N1831 Chronic kidney disease, stage 3a: Secondary | ICD-10-CM

## 2020-01-22 DIAGNOSIS — I10 Essential (primary) hypertension: Secondary | ICD-10-CM

## 2020-01-22 DIAGNOSIS — Z951 Presence of aortocoronary bypass graft: Secondary | ICD-10-CM | POA: Diagnosis not present

## 2020-01-22 DIAGNOSIS — I2510 Atherosclerotic heart disease of native coronary artery without angina pectoris: Secondary | ICD-10-CM

## 2020-01-22 DIAGNOSIS — Z952 Presence of prosthetic heart valve: Secondary | ICD-10-CM

## 2020-01-22 DIAGNOSIS — I493 Ventricular premature depolarization: Secondary | ICD-10-CM

## 2020-01-22 DIAGNOSIS — I5042 Chronic combined systolic (congestive) and diastolic (congestive) heart failure: Secondary | ICD-10-CM

## 2020-01-22 NOTE — Patient Instructions (Signed)
Medication Instructions:  Your physician recommends that you continue on your current medications as directed. Please refer to the Current Medication list given to you today.  Labwork: NONE  Testing/Procedures: NONE  Follow-Up: AFTER YOU MOVE TO GET ESTABLISHED WITH CARDIOLOGIST

## 2020-01-26 ENCOUNTER — Encounter: Payer: Self-pay | Admitting: Endocrinology

## 2020-01-26 ENCOUNTER — Ambulatory Visit (INDEPENDENT_AMBULATORY_CARE_PROVIDER_SITE_OTHER): Payer: Medicare Other | Admitting: Endocrinology

## 2020-01-26 ENCOUNTER — Telehealth: Payer: Self-pay | Admitting: *Deleted

## 2020-01-26 ENCOUNTER — Other Ambulatory Visit: Payer: Self-pay

## 2020-01-26 VITALS — BP 110/64 | HR 64 | Ht 64.0 in | Wt 149.0 lb

## 2020-01-26 DIAGNOSIS — I2510 Atherosclerotic heart disease of native coronary artery without angina pectoris: Secondary | ICD-10-CM | POA: Diagnosis not present

## 2020-01-26 DIAGNOSIS — E119 Type 2 diabetes mellitus without complications: Secondary | ICD-10-CM

## 2020-01-26 DIAGNOSIS — Z794 Long term (current) use of insulin: Secondary | ICD-10-CM | POA: Diagnosis not present

## 2020-01-26 LAB — POCT GLYCOSYLATED HEMOGLOBIN (HGB A1C): Hemoglobin A1C: 6.6 % — AB (ref 4.0–5.6)

## 2020-01-26 MED ORDER — LEVEMIR FLEXTOUCH 100 UNIT/ML ~~LOC~~ SOPN
12.0000 [IU] | PEN_INJECTOR | SUBCUTANEOUS | 6 refills | Status: DC
Start: 2020-01-26 — End: 2020-01-26

## 2020-01-26 MED ORDER — LEVEMIR FLEXTOUCH 100 UNIT/ML ~~LOC~~ SOPN: 12.0000 [IU] | PEN_INJECTOR | SUBCUTANEOUS | 6 refills | Status: AC

## 2020-01-26 NOTE — Progress Notes (Signed)
Subjective:    Patient ID: Austin Lloyd, male    DOB: 1929-10-11, 86 y.o.   MRN: [id number]  HPI Pt returns for f/u of diabetes mellitus: DM type: Insulin-requiring type 2 Dx'ed: 3546 Complications: CAD and stage 4 CRI Therapy: insulin since soon after dx DKA: never Severe hypoglycemia: last episode was aprox 2015 Pancreatitis: never Pancreatic imaging: normal on 2019 CT.   SDOH: none Other: due to frail elderly state: he is not a candidate for aggressive glycemic control, or for multiple daily injections.  Interval history: He brings a record of his cbg's which I have reviewed today.  cbg varies from 96-142.  There is no trend throughout the day.  pt states he feels well in general.  He is moving back to [place] in 3 weeks.   Past Medical History:  Diagnosis Date  . Aortic stenosis   . Bladder cancer ([place])    T1 status post BCG  . 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 Coronary artery disease with history of myocardial infarction without history of CABG    LIMA to the LAD, SVG to RCA 2016 for treatment of the percent left main stenosis and 80% RCA stenosis.    . Depression   . Diabetes mellitus, type II ([place])   . Hypertension   . Ischemic cardiomyopathy    EF 40% 2019  . Lung cancer ([place])    Adenocarcinoma of the right lung moderately differentiated  . PVC's (premature ventricular contractions)   . S/P TAVR (transcatheter aortic valve replacement)     Past Surgical History:  Procedure Laterality Date  . CORONARY ARTERY BYPASS GRAFT     2014  . INGUINAL HERNIA REPAIR    . INTRAOPERATIVE TRANSTHORACIC ECHOCARDIOGRAM  02/05/2018   Procedure: INTRAOPERATIVE TRANSTHORACIC ECHOCARDIOGRAM;  Surgeon: [name], MD;  Location: [facility] OR;  Service: Open Heart Surgery;;  . LUNG REMOVAL, PARTIAL    . RIGHT/LEFT HEART CATH AND CORONARY/GRAFT ANGIOGRAPHY N/A 12/21/2017   Procedure: RIGHT/LEFT HEART  CATH AND CORONARY/GRAFT ANGIOGRAPHY;  Surgeon: [name], MD;  Location: [place] CV LAB;  Service: Cardiovascular;  Laterality: N/A;  . TRANSCATHETER AORTIC VALVE REPLACEMENT, TRANSFEMORAL  02/05/2018   TRANSCATHETER AORTIC VALVE REPLACEMENT, TRANSFEMORAL (  . TRANSCATHETER AORTIC VALVE REPLACEMENT, TRANSFEMORAL N/A 02/05/2018   Procedure: TRANSCATHETER AORTIC VALVE REPLACEMENT, TRANSFEMORAL;  Surgeon: [name], MD;  Location: [place];  Service: Open Heart Surgery;  Laterality: N/A;  . Ulcer surgery      Social History   Socioeconomic History  . Marital status: Married    Spouse name: Not on file  . Number of children: 3  . Years of education: Not on file  . Highest education level: Not on file  Occupational History  . 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 Smoking status: Former Smoker    Packs/day: 0.20    Years: 30.00    Pack years: 6.00    Types: Cigarettes  . Smokeless tobacco: Never Used  Vaping Use  . Vaping Use: Never used  Substance and Sexual Activity  . Alcohol use: Yes    Alcohol/week: 2.0 standard drinks    Types: 2 Cans of beer per week    Comment: 2 cans beer per week.  . Drug use: Never  . Sexual activity: Not on file  Other Topics Concern  . Not on file  Social History Narrative  Lives with wife daughter.  Three children and one grandchild.    Social Determinants of Health   Financial Resource Strain:   . Difficulty of Paying Living Expenses: Not on file  Food Insecurity:   . Worried About Charity fundraiser in the Last Year: Not on file  . Ran Out of Food in the Last Year: Not on file  Transportation Needs:   . Lack of Transportation (Medical): Not on file  . Lack of Transportation (Non-Medical): Not on file  Physical Activity:   . Days of Exercise per Week: Not on file  . Minutes of Exercise per Session: Not on file  Stress:   . Feeling of Stress : Not on file  Social  Connections:   . Frequency of Communication with Friends and Family: Not on file  . Frequency of Social Gatherings with Friends and Family: Not on file  . Attends Religious Services: Not on file  . Active Member of Clubs or Organizations: Not on file  . Attends Archivist Meetings: Not on file  . Marital Status: Not on file  Intimate Partner Violence:   . Fear of Current or Ex-Partner: Not on file  . Emotionally Abused: Not on file  . Physically Abused: Not on file  . Sexually Abused: Not on file    Current Outpatient Medications on File Prior to Visit  Medication Sig Dispense Refill  . ACCU-CHEK AVIVA PLUS test strip TEST BLOOD SUGAR THREE TIMES A DAY. DX E11.9 (Patient taking differently: Pt test blood sugar twice a day) 200 strip 6  . Accu-Chek Softclix Lancets lancets TEST BLOOD SUGAR THREE TIMES A DAY. DX E11.9 200 each 6  . aspirin EC 81 MG tablet Take 81 mg by mouth every evening.     . ferrous sulfate 325 (65 FE) MG tablet Take 325 mg by mouth daily with breakfast.    . furosemide (LASIX) 40 MG tablet TAKE 1 TABLET BY MOUTH EVERY DAY 90 tablet 3  . levocetirizine (XYZAL) 5 MG tablet Take 1 tablet (5 mg total) by mouth every evening. 90 tablet 2  . losartan (COZAAR) 25 MG tablet Take 1 tablet (25 mg total) by mouth daily. 90 tablet 3  . lovastatin (MEVACOR) 40 MG tablet TAKE 1 TABLET BY MOUTH EVERYDAY AT BEDTIME 90 tablet 1  . metoprolol succinate (TOPROL-XL) 25 MG 24 hr tablet TAKE 1 TABLET BY MOUTH TWICE A DAY 180 tablet 1  . Multiple Vitamin (MULTIVITAMIN) tablet Take 1 tablet by mouth daily.    . tamsulosin (FLOMAX) 0.4 MG CAPS capsule TAKE 1 CAPSULE BY MOUTH EVERY DAY 90 capsule 3   No current facility-administered medications on file prior to visit.    No Known Allergies  Family History  Problem Relation Age of Onset  . AAA (abdominal aortic aneurysm) Mother   . Cancer Father        Brain tumor  . Diabetes Neg Hx     BP 110/64   Pulse 64   Ht 5\' 4"   (1.626 m)   Wt 149 lb (67.6 kg)   SpO2 95%   BMI 25.58 kg/m    Review of Systems He denies hypoglycemia.      Objective:   Physical Exam VITAL SIGNS:  See vs page GENERAL: no distress Pulses: dorsalis pedis intact bilat.   MSK: no deformity of the feet CV: 1+ bilat leg edema Skin:  no ulcer on the feet.  normal color and temp on the feet.  Old  healed surgical scars on both legs (vein harvest).   Neuro: sensation is intact to touch on the feet.     Lab Results  Component Value Date   HGBA1C 6.6 (A) 01/26/2020   Lab Results  Component Value Date   CREATININE 1.80 (H) 01/09/2020   BUN 41 (H) 08/04/2019   NA 140 08/04/2019   K 5.0 08/04/2019   CL 104 08/04/2019   CO2 26 08/04/2019       Assessment & Plan:  Insulin-requiring type 2 DM, with stage 4 CRI Frail elderly state: this limits aggressiveness of glycemic control  Patient Instructions  check your blood sugar twice a day.  vary the time of day when you check, between before the 3 meals, and at bedtime.  also check if you have symptoms of your blood sugar being too high or too low.  please keep a record of the readings and bring it to your next appointment here (or you can bring the meter itself).  You can write it on any piece of paper.  please call us sooner if your blood sugar goes below 70, or if you have a lot of readings over 200.   Please reduce the Levemir to 12 units each morning.   Best wishes on your move to [place].

## 2020-01-26 NOTE — Telephone Encounter (Signed)
Records ready to be pick-up front office

## 2020-01-26 NOTE — Patient Instructions (Addendum)
check your blood sugar twice a day.  vary the time of day when you check, between before the 3 meals, and at bedtime.  also check if you have symptoms of your blood sugar being too high or too low.  please keep a record of the readings and bring it to your next appointment here (or you can bring the meter itself).  You can write it on any piece of paper.  please call us sooner if your blood sugar goes below 70, or if you have a lot of readings over 200.   Please reduce the Levemir to 12 units each morning.   Best wishes on your move to [place].

## 2020-01-30 DIAGNOSIS — E1159 Type 2 diabetes mellitus with other circulatory complications: Secondary | ICD-10-CM | POA: Diagnosis not present

## 2020-01-30 DIAGNOSIS — L602 Onychogryphosis: Secondary | ICD-10-CM | POA: Diagnosis not present

## 2020-01-30 DIAGNOSIS — L84 Corns and callosities: Secondary | ICD-10-CM | POA: Diagnosis not present

## 2020-02-04 ENCOUNTER — Inpatient Hospital Stay: Payer: Medicare Other

## 2020-02-04 ENCOUNTER — Ambulatory Visit: Payer: Medicare Other | Admitting: Family Medicine

## 2020-02-04 ENCOUNTER — Inpatient Hospital Stay: Payer: Medicare Other | Attending: Hematology | Admitting: Hematology

## 2020-02-04 ENCOUNTER — Other Ambulatory Visit: Payer: Self-pay

## 2020-02-04 VITALS — BP 115/67 | HR 73 | Temp 96.7°F | Resp 18 | Ht 64.0 in | Wt 149.2 lb

## 2020-02-04 DIAGNOSIS — C3401 Malignant neoplasm of right main bronchus: Secondary | ICD-10-CM | POA: Insufficient documentation

## 2020-02-04 DIAGNOSIS — C9110 Chronic lymphocytic leukemia of B-cell type not having achieved remission: Secondary | ICD-10-CM

## 2020-02-04 DIAGNOSIS — I2510 Atherosclerotic heart disease of native coronary artery without angina pectoris: Secondary | ICD-10-CM | POA: Diagnosis not present

## 2020-02-04 DIAGNOSIS — C3400 Malignant neoplasm of unspecified main bronchus: Secondary | ICD-10-CM

## 2020-02-04 LAB — CBC WITH DIFFERENTIAL/PLATELET
Abs Immature Granulocytes: 0.03 10*3/uL (ref 0.00–0.07)
Basophils Absolute: 0 10*3/uL (ref 0.0–0.1)
Basophils Relative: 0 %
Eosinophils Absolute: 0.1 10*3/uL (ref 0.0–0.5)
Eosinophils Relative: 1 %
HCT: 37.2 % — ABNORMAL LOW (ref 39.0–52.0)
Hemoglobin: 12.3 g/dL — ABNORMAL LOW (ref 13.0–17.0)
Immature Granulocytes: 0 %
Lymphocytes Relative: 27 %
Lymphs Abs: 2 10*3/uL (ref 0.7–4.0)
MCH: 30.6 pg (ref 26.0–34.0)
MCHC: 33.1 g/dL (ref 30.0–36.0)
MCV: 92.5 fL (ref 80.0–100.0)
Monocytes Absolute: 0.6 10*3/uL (ref 0.1–1.0)
Monocytes Relative: 8 %
Neutro Abs: 4.7 10*3/uL (ref 1.7–7.7)
Neutrophils Relative %: 64 %
Platelets: 178 10*3/uL (ref 150–400)
RBC: 4.02 MIL/uL — ABNORMAL LOW (ref 4.22–5.81)
RDW: 13.9 % (ref 11.5–15.5)
WBC: 7.4 10*3/uL (ref 4.0–10.5)
nRBC: 0 % (ref 0.0–0.2)

## 2020-02-04 LAB — CMP (CANCER CENTER ONLY)
ALT: 28 U/L (ref 0–44)
AST: 36 U/L (ref 15–41)
Albumin: 3.3 g/dL — ABNORMAL LOW (ref 3.5–5.0)
Alkaline Phosphatase: 108 U/L (ref 38–126)
Anion gap: 8 (ref 5–15)
BUN: 41 mg/dL — ABNORMAL HIGH (ref 8–23)
CO2: 27 mmol/L (ref 22–32)
Calcium: 8.8 mg/dL — ABNORMAL LOW (ref 8.9–10.3)
Chloride: 104 mmol/L (ref 98–111)
Creatinine: 1.6 mg/dL — ABNORMAL HIGH (ref 0.61–1.24)
GFR, Estimated: 41 mL/min — ABNORMAL LOW (ref >60)
Glucose, Bld: 206 mg/dL — ABNORMAL HIGH (ref 70–99)
Potassium: 5 mmol/L (ref 3.5–5.1)
Sodium: 139 mmol/L (ref 135–145)
Total Bilirubin: 0.5 mg/dL (ref 0.3–1.2)
Total Protein: 5.8 g/dL — ABNORMAL LOW (ref 6.5–8.1)

## 2020-02-04 LAB — LACTATE DEHYDROGENASE: LDH: 290 U/L — ABNORMAL HIGH (ref 98–192)

## 2020-02-04 NOTE — Progress Notes (Signed)
HEMATOLOGY/ONCOLOGY CLINIC NOTE  Date of Service: 02/04/2020  Patient Care Team: Shelda Pal, DO as PCP - General (Family Medicine) [name], MD as PCP - Cardiology (Cardiology)  CHIEF COMPLAINTS/PURPOSE OF CONSULTATION:  History of CLL and NSCLC  Oncologic History:   Austin Lloyd was diagnosed with CLL in 2012 and was treated with one cycle on Bendamustine and possibly Rituxan (incomplete records) before developing acute kidney injury and then completing 5 cycles of Gazyva and Chlorambucil.   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 Treated from 06/13/16 to 06/23/16 with stereotactic body radiotherapy to right upper lobe, total dose of 5000cGy in 5 fractions   Bladder cancer prior to 2010. Low grade prostate cancer currently.   HISTORY OF PRESENTING ILLNESS:   Austin Lloyd is a wonderful 86 y.o. male who has been referred to [place] by Dr. Riki Sheer for evaluation and management of history of CLL and non-small cell lung cancer. He is accompanied today by his wife. The pt reports that he is doing well overall and has enjoyed his recent move from [place].   The pt notes that he has had stable energy levels. He notes that he has not had any changes in breathing and has not developed any constitutional symptom, denying fevers, chills, night sweats and unexpected weight loss. The pt notes that he has not been able to walk as far as he used to and has to sit down often.   The pt reports that his elevated WBC was incidentally picked up while living in [place] in 2012. The pt notes that his treatment with Bendamustine, and possibly Rituxan, was complicated by CHF and acute kidney injury and he notes that he had two infusions at the  time.   The pt notes that his lung cancer was initially diagnosed in 2015 and was resected. It then recurred in 2018 and was treated with radiation.   The pt has had three squamous cell carcinomas, one under left eye, one under left lear, and on occipital scalp. He also has had a basal cell carcinoma. He will be establishing care with dermatology in October.  The pt notes that his bladder cancer and prostate cancer has received BCG and denies radiation to his prostate. He has begun care with urology Dr. Louis Meckel.   He has just begun seeing Dr. [name] in Cardiology as well for his history of CHF, CAD, and aortic stenosis. The pt had an MI in 2015 with subsequent CABG x2.   Most recent lab results (11/06/17) of CBC and CMP is as follows: all values are WNL except for HGB at 12.9, RDW at 15.6, BUN at 33, Creatinine at 1.47, GFR at 43.  On review of systems, pt reports stable energy levels, and denies fevers, chills, night sweats, unexpected weight loss, noticing any new lumps or bumps, changes in breathing, blood in the urine, abdominal pains, chest pain, and any other symptoms.   On PMHx the pt reports DM type II, CKD stage III, CAD s/p two vessel CABG, CLL, invasive adenocarcinoma. 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 Basal cell carcinoma, high grade bladder cancer, low grade prostate cancer, MI in June 2015, acute decompensated heart failure. On Social Hx the pt reports quit smoking cigarettes more than  45 years ago  Interval History:   Austin Lloyd [place] Lloyd returns today for management and evaluation of his CLL and history of NSCLC. We are joined today by his wife, [name]. The patient's last visit with [place] was on 08/04/2019. The pt reports that he is doing well overall.  The pt reports that he, his wife, and daughter are planning to move to [place]. Pt has continued to follow with Dermatology every 6 months. He did not have any new  lesions that required intervention at his last visit. Pt is also following with Dr. Louis Meckel at [facility]. Pt was told to follow up in one year at his last visit. He has received his flu vaccine, but not his COVID19 booster. Pt reports recent constipation that was resolved by warm prune juice.  Of note since the patient's last visit, pt has had CT Chest ([id number]) completed on 01/09/2020 with results revealing "1. Unchanged appearance of partially calcified right suprahilar mass compatible with treated tumor 2. 4 mm nodule within the posteromedial right lower lobe has slowly increased in size from previous exams. 3. Small bilateral pleural effusions are new from previous exam. 4. Unchanged appearance of ground-glass attenuating nodule within the posterior right apex. 5. Gallstone. 6. Emphysema and aortic atherosclerosis."  Lab results today (02/04/20) of CBC w/diff and CMP is as follows: all values are WNL except for RBC at 4.02, Hgb at 12.3, HCT at 37.2, Glucose at 206, BUN at 41, Creatinine at 1.60, Calcium at 8.8, Total Protein at 5.8, Albumin at 3.3, GFR Est at 41.   On review of systems, pt denies new skin lesions, constipation, abdominal pain and any other symptoms.   MEDICAL HISTORY:  Past Medical History:  Diagnosis Date  . Aortic stenosis   . Bladder cancer ([place])    T1 status post BCG  . 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 Coronary artery disease with history of myocardial infarction without history of CABG    LIMA to the LAD, SVG to RCA 2016 for treatment of the percent left main stenosis and 80% RCA stenosis.    . Depression   . Diabetes mellitus, type II ([place])   . Hypertension   . Ischemic cardiomyopathy    EF 40% 2019  . Lung cancer ([place])    Adenocarcinoma of the right lung moderately differentiated  . PVC's (premature ventricular contractions)   . S/P TAVR (transcatheter aortic valve replacement)      SURGICAL HISTORY: Past Surgical History:  Procedure Laterality Date  . CORONARY ARTERY BYPASS GRAFT     2014  . INGUINAL HERNIA REPAIR    . INTRAOPERATIVE TRANSTHORACIC ECHOCARDIOGRAM  02/05/2018   Procedure: INTRAOPERATIVE TRANSTHORACIC ECHOCARDIOGRAM;  Surgeon: [name], MD;  Location: [facility] OR;  Service: Open Heart Surgery;;  . LUNG REMOVAL, PARTIAL    . RIGHT/LEFT HEART CATH AND CORONARY/GRAFT ANGIOGRAPHY N/A 12/21/2017   Procedure: RIGHT/LEFT HEART CATH AND CORONARY/GRAFT ANGIOGRAPHY;  Surgeon: [name], MD;  Location: [facility];  Service: Cardiovascular;  Laterality: N/A;  . TRANSCATHETER AORTIC VALVE REPLACEMENT, TRANSFEMORAL  02/05/2018   TRANSCATHETER AORTIC VALVE REPLACEMENT, TRANSFEMORAL (  . TRANSCATHETER AORTIC VALVE REPLACEMENT, TRANSFEMORAL N/A 02/05/2018   Procedure: TRANSCATHETER AORTIC VALVE REPLACEMENT, TRANSFEMORAL;  Surgeon: [name], MD;  Location: [facility];  Service: Open Heart Surgery;  Laterality: N/A;  . Ulcer surgery      SOCIAL HISTORY: Social History   Socioeconomic History  .  Marital status: Married    Spouse name: Not on file  . Number of children: 3  . Years of education: Not on file  . Highest education level: Not on file  Occupational History  . 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 Smoking status: Former Smoker    Packs/day: 0.20    Years: 30.00    Pack years: 6.00    Types: Cigarettes  . Smokeless tobacco: Never Used  Vaping Use  . Vaping Use: Never used  Substance and Sexual Activity  . Alcohol use: Yes    Alcohol/week: 2.0 standard drinks    Types: 2 Cans of beer per week    Comment: 2 cans beer per week.  . Drug use: Never  . Sexual activity: Not on file  Other Topics Concern  . Not on file  Social History Narrative   Lives with wife daughter.  Three children and one grandchild.    Social Determinants of Health   Financial Resource  Strain:   . Difficulty of Paying Living Expenses: Not on file  Food Insecurity:   . Worried About Charity fundraiser in the Last Year: Not on file  . Ran Out of Food in the Last Year: Not on file  Transportation Needs:   . Lack of Transportation (Medical): Not on file  . Lack of Transportation (Non-Medical): Not on file  Physical Activity:   . Days of Exercise per Week: Not on file  . Minutes of Exercise per Session: Not on file  Stress:   . Feeling of Stress : Not on file  Social Connections:   . Frequency of Communication with Friends and Family: Not on file  . Frequency of Social Gatherings with Friends and Family: Not on file  . Attends Religious Services: Not on file  . Active Member of Clubs or Organizations: Not on file  . Attends Archivist Meetings: Not on file  . Marital Status: Not on file  Intimate Partner Violence:   . Fear of Current or Ex-Partner: Not on file  . Emotionally Abused: Not on file  . Physically Abused: Not on file  . Sexually Abused: Not on file    FAMILY HISTORY: Family History  Problem Relation Age of Onset  . AAA (abdominal aortic aneurysm) Mother   . Cancer Father        Brain tumor  . Diabetes Neg Hx     ALLERGIES:  has No Known Allergies.  MEDICATIONS:  Current Outpatient Medications  Medication Sig Dispense Refill  . ACCU-CHEK AVIVA PLUS test strip TEST BLOOD SUGAR THREE TIMES A DAY. DX E11.9 (Patient taking differently: Pt test blood sugar twice a day) 200 strip 6  . Accu-Chek Softclix Lancets lancets TEST BLOOD SUGAR THREE TIMES A DAY. DX E11.9 200 each 6  . aspirin EC 81 MG tablet Take 81 mg by mouth every evening.     . BD PEN NEEDLE NANO 2ND GEN 32G X 4 MM MISC     . ferrous sulfate 325 (65 FE) MG tablet Take 325 mg by mouth daily with breakfast.    . furosemide (LASIX) 40 MG tablet TAKE 1 TABLET BY MOUTH EVERY DAY 90 tablet 3  . insulin detemir (LEVEMIR FLEXTOUCH) 100 UNIT/ML FlexPen Inject 12 Units into the skin every  morning. 15 mL 6  . levocetirizine (XYZAL) 5 MG tablet Take 1 tablet (5 mg total) by mouth every evening. 90 tablet 2  .  losartan (COZAAR) 25 MG tablet Take 1 tablet (25 mg total) by mouth daily. 90 tablet 3  . lovastatin (MEVACOR) 40 MG tablet TAKE 1 TABLET BY MOUTH EVERYDAY AT BEDTIME 90 tablet 1  . metoprolol succinate (TOPROL-XL) 25 MG 24 hr tablet TAKE 1 TABLET BY MOUTH TWICE A DAY 180 tablet 1  . Multiple Vitamin (MULTIVITAMIN) tablet Take 1 tablet by mouth daily.    . tamsulosin (FLOMAX) 0.4 MG CAPS capsule TAKE 1 CAPSULE BY MOUTH EVERY DAY 90 capsule 3   No current facility-administered medications for this visit.    REVIEW OF SYSTEMS:   A 10+ POINT REVIEW OF SYSTEMS WAS OBTAINED including neurology, dermatology, psychiatry, cardiac, respiratory, lymph, extremities, GI, GU, Musculoskeletal, constitutional, breasts, reproductive, HEENT.  All pertinent positives are noted in the HPI.  All others are negative.   PHYSICAL EXAMINATION: ECOG PERFORMANCE STATUS: 2 - Symptomatic, <50% confined to bed  Vitals:   02/04/20 1033  BP: 115/67  Pulse: 73  Resp: 18  Temp: (!) 96.7 F (35.9 C)  SpO2: 96%   Filed Weights   02/04/20 1033  Weight: 149 lb 3.2 oz (67.7 kg)   .Body mass index is 25.61 kg/m.   GENERAL:alert, in no acute distress and comfortable SKIN: no acute rashes, no significant lesions EYES: conjunctiva are pink and non-injected, sclera anicteric OROPHARYNX: MMM, no exudates, no oropharyngeal erythema or ulceration NECK: supple, no JVD LYMPH:  no palpable lymphadenopathy in the cervical, axillary or inguinal regions LUNGS: clear to auscultation b/l with normal respiratory effort HEART: regular rate & rhythm ABDOMEN:  normoactive bowel sounds , non tender, not distended. No palpable hepatosplenomegaly.  Extremity: no pedal edema PSYCH: alert & oriented x 3 with fluent speech NEURO: no focal motor/sensory deficits  LABORATORY DATA:  I have reviewed the data as  listed  . CBC Latest Ref Rng & Units 02/04/2020 08/04/2019 07/24/2019  WBC 4.0 - 10.5 K/uL 7.4 9.6 8.5  Hemoglobin 13.0 - 17.0 g/dL 12.3(L) 13.0 13.1  Hematocrit 39 - 52 % 37.2(L) 39.9 38.8  Platelets 150 - 400 K/uL 178 176 228   . CBC    Component Value Date/Time   WBC 7.4 02/04/2020 1011   RBC 4.02 (L) 02/04/2020 1011   HGB 12.3 (L) 02/04/2020 1011   HGB 13.1 07/24/2019 1120   HCT 37.2 (L) 02/04/2020 1011   HCT 38.8 07/24/2019 1120   PLT 178 02/04/2020 1011   PLT 228 07/24/2019 1120   MCV 92.5 02/04/2020 1011   MCV 90 07/24/2019 1120   MCH 30.6 02/04/2020 1011   MCHC 33.1 02/04/2020 1011   RDW 13.9 02/04/2020 1011   RDW 13.1 07/24/2019 1120   LYMPHSABS 2.0 02/04/2020 1011   MONOABS 0.6 02/04/2020 1011   EOSABS 0.1 02/04/2020 1011   BASOSABS 0.0 02/04/2020 1011    . CMP Latest Ref Rng & Units 02/04/2020 01/09/2020 08/04/2019  Glucose 70 - 99 mg/dL 206(H) - 134(H)  BUN 8 - 23 mg/dL 41(H) - 41(H)  Creatinine 0.61 - 1.24 mg/dL 1.60(H) 1.80(H) 1.70(H)  Sodium 135 - 145 mmol/L 139 - 140  Potassium 3.5 - 5.1 mmol/L 5.0 - 5.0  Chloride 98 - 111 mmol/L 104 - 104  CO2 22 - 32 mmol/L 27 - 26  Calcium 8.9 - 10.3 mg/dL 8.8(L) - 9.5  Total Protein 6.5 - 8.1 g/dL 5.8(L) - 6.4(L)  Total Bilirubin 0.3 - 1.2 mg/dL 0.5 - 0.5  Alkaline Phos 38 - 126 U/L 108 - 113  AST 15 - 41  U/L 36 - 31  ALT 0 - 44 U/L 28 - 28     RADIOGRAPHIC STUDIES: I have personally reviewed the radiological images as listed and agreed with the findings in the report. CT CHEST WO CONTRAST  Result Date: 01/09/2020 CLINICAL DATA:  Non-small cell lung cancer. Assess treatment response. History of surgical resection and external beam radiation. EXAM: CT CHEST WITHOUT CONTRAST TECHNIQUE: Multidetector CT imaging of the chest was performed following the standard protocol without IV contrast. COMPARISON:  01/20/2019 FINDINGS: Cardiovascular: Normal heart size. No pericardial effusion. Previous aortic valve  replacement. Status post CABG. Aortic atherosclerosis. Mediastinum/Nodes: Normal appearance of the thyroid gland. The trachea appears patent and is midline. Normal appearance of the esophagus. No enlarged axillary, supraclavicular, mediastinal, or hilar lymph nodes. Lungs/Pleura: Mild centrilobular emphysema. There are small bilateral pleural effusions which appear new from previous exam. Partially calcified right suprahilar mass is again noted which chronically occludes the anterior segment of the right upper lobe bronchus. This measures 4 x 3.1 cm, image 65/10. Not significantly changed from previous exam. 5 mm right apical nodule is stable, image 41/10. Ground-glass attenuating nodule within the posterior right apex measures 1.1 cm, image 33/10. Unchanged from previous exam. 4 mm nodule is identified within the posteromedial right lower lobe, image 93/10. This has slowly increased in size from 06/18/2018 when it measured 1-2 mm. Upper Abdomen: No acute findings within the upper abdomen. Aortic atherosclerosis. Calcified gallstone measures 2.1 cm. Musculoskeletal: No chest wall mass or suspicious bone lesions identified. IMPRESSION: 1. Unchanged appearance of partially calcified right suprahilar mass compatible with treated tumor. 2. 4 mm nodule within the posteromedial right lower lobe has slowly increased in size from previous exams. This is too small to biopsy or evaluate with PET-CT. Consider close interval surveillance with repeat CT of the chest in 3-6 months. 3. Small bilateral pleural effusions are new from previous exam. 4. Unchanged appearance of ground-glass attenuating nodule within the posterior right apex. 5. Gallstone. 6. Emphysema and aortic atherosclerosis. Aortic Atherosclerosis (ICD10-I70.0) and Emphysema (ICD10-J43.9). Electronically Signed   By: Kerby Moors M.D.   On: 01/09/2020 16:43     08/04/17 CT Chest:    ASSESSMENT & PLAN:   86 y.o. male with  1. History of CLL 03/2013  treated with Bendamustine and possibly Rituxan (outside records incomplete) but discontinued after 1 dose due to acute kidney injury 04/2013 to 08/2013 treated with 5 cycles of Gazyva and Chlorambucil  04/2015 FISH was without chromosomal abnormalities   2. History of Lung Cancer Invasive adenocarcinoma of the right upper lung in 2015, moderately differentiated, initially Stage IB with visceral pleural invasion but resected with clear margins and no lymph node involvement 04/2016 Biopsy confirmed right upper lobe recurrence of non-small cell cancer. Treated with stereotactic body radiotherapy to right upper lobe , total dose of 5000cGy in 5 fractions  01/2017 New right lower lobe nodule initially concerning for recurrence, but then resolved spontaneously  03/22/18 CT Chest revealed "The partially calcified right perihilar mass is stable from available priors dating back to 01/09/2018. Based on the stability and lack of hypermetabolic activity on PET-CT, this is likely postinflammatory or treated tumor. Continued CT follow-up (3-6 months) recommended. 2. New patchy left lower lobe airspace disease, likely inflammatory and potentially early bronchopneumonia. Radiographic follow up recommended. 3. Stable right upper lobe solid and ground-glass opacities can be addressed on subsequent follow-up. 4. Coronary and Aortic Atherosclerosis. Previous T AVR."  06/18/18 CT Chest revealed "Stable RIGHT suprahilar mass most  consists with post the radiation / surgical therapy change. 2. Waxing waning airspace disease/nodularity in the LEFT lower lobe with increased nodularity. Findings atypical for carcinoma. Consider aspiration pneumonia. Recommend follow-up CT exam in 1-3 months. 3. Stable ground-glass nodules in the upper lobe are indeterminate. Recommend attention on routine oncology surveillance."  3. Prostate cancer low grade Has maintained surveillance with urology and every 6 month PSA checks -on lupron with  urology  4. History of high grade bladder cancer Continue follow up with urology  5. History of Squamous cell carcinoma x3 and Basal cell carcinoma -continue f/u with dermatology  6. CKD Stage III - stable . Continue f/u with PCP  7. H/o CAD, CHF, with mod-severe Aortic Stenosis.modMR -continue f/u with cardiology for continued Mx.   8. Bronchopneumonia- seen on 03/22/18 CT Chest, with clinical correlation on 03/27/18  PLAN: -Discussed pt labwork today, 02/04/20; WBC & PLT are nml, Hgb holding steady, blood chemistries are stable, LDH is stable.  -Discussed 01/09/2020 CT Chest ([id number]) which revealed unchanged suprahilar mass, and a small, slowly changing nodule in the right lower lung. Also, small b/l pleural effusions.  -The pt shows no lab, clinical, or radiographic evidence of recurrence of his Lung Cancer requiring treatment at this time. -The pt shows no lab, clinical, or radiographic evidence of recurrence/progression of his CLL requiring treatment at this time. -Discussed CDC guidelines regarding the COVID19 booster. Strongly recommended based on pt's medical issues. Will give in clinic today.  -Recommend pt establish care with a PCP in [place] who can refer to Cardiology, Dermatology, Urology, & Oncology ASAP. -Recommend pt repeat CT Chest in 6 months to monitor posteromedial right lower lobe nodule.  -Will see back as needed   FOLLOW UP: COVID booster vaccine for patient and his wife today RTC with Dr Irene Limbo as needed -- Patient moving to [place]   The total time spent in the appt was 30 minutes and more than 50% was on counseling and direct patient cares.  All of the patient's questions were answered with apparent satisfaction. The patient knows to call the clinic with any problems, questions or concerns.    Sullivan Lone MD [place] AAHIVMS [facility] Hematology/Oncology Physician [facility]  (Office):       [phone] (Work cell):  [phone] (Fax):            [phone]  02/04/2020 11:39 AM  I, Yevette Edwards, am acting as a scribe for Dr. Sullivan Lone.   .I have reviewed the above documentation for accuracy and completeness, and I agree with the above. Brunetta Genera MD

## 2020-02-08 ENCOUNTER — Other Ambulatory Visit: Payer: Self-pay | Admitting: Physician Assistant

## 2020-02-08 ENCOUNTER — Other Ambulatory Visit: Payer: Self-pay | Admitting: Family Medicine

## 2020-02-21 DIAGNOSIS — J984 Other disorders of lung: Secondary | ICD-10-CM | POA: Diagnosis not present

## 2020-02-21 DIAGNOSIS — Z20822 Contact with and (suspected) exposure to covid-19: Secondary | ICD-10-CM | POA: Diagnosis not present

## 2020-02-21 DIAGNOSIS — B349 Viral infection, unspecified: Secondary | ICD-10-CM | POA: Diagnosis not present

## 2020-02-21 DIAGNOSIS — R509 Fever, unspecified: Secondary | ICD-10-CM | POA: Diagnosis not present

## 2020-02-21 DIAGNOSIS — R059 Cough, unspecified: Secondary | ICD-10-CM | POA: Diagnosis not present

## 2020-02-26 DIAGNOSIS — C3411 Malignant neoplasm of upper lobe, right bronchus or lung: Secondary | ICD-10-CM | POA: Diagnosis not present

## 2020-02-26 DIAGNOSIS — Z20822 Contact with and (suspected) exposure to covid-19: Secondary | ICD-10-CM | POA: Diagnosis not present

## 2020-02-26 DIAGNOSIS — R630 Anorexia: Secondary | ICD-10-CM | POA: Diagnosis not present

## 2020-02-26 DIAGNOSIS — G9341 Metabolic encephalopathy: Secondary | ICD-10-CM | POA: Diagnosis not present

## 2020-02-26 DIAGNOSIS — R0902 Hypoxemia: Secondary | ICD-10-CM | POA: Diagnosis not present

## 2020-02-26 DIAGNOSIS — D72829 Elevated white blood cell count, unspecified: Secondary | ICD-10-CM | POA: Diagnosis not present

## 2020-02-26 DIAGNOSIS — R0602 Shortness of breath: Secondary | ICD-10-CM | POA: Diagnosis not present

## 2020-02-26 DIAGNOSIS — R7989 Other specified abnormal findings of blood chemistry: Secondary | ICD-10-CM | POA: Diagnosis not present

## 2020-02-26 DIAGNOSIS — N179 Acute kidney failure, unspecified: Secondary | ICD-10-CM | POA: Diagnosis not present

## 2020-02-26 DIAGNOSIS — C9110 Chronic lymphocytic leukemia of B-cell type not having achieved remission: Secondary | ICD-10-CM | POA: Diagnosis not present

## 2020-02-26 DIAGNOSIS — I130 Hypertensive heart and chronic kidney disease with heart failure and stage 1 through stage 4 chronic kidney disease, or unspecified chronic kidney disease: Secondary | ICD-10-CM | POA: Diagnosis not present

## 2020-02-26 DIAGNOSIS — R918 Other nonspecific abnormal finding of lung field: Secondary | ICD-10-CM | POA: Diagnosis not present

## 2020-02-26 DIAGNOSIS — J189 Pneumonia, unspecified organism: Secondary | ICD-10-CM | POA: Diagnosis not present

## 2020-02-26 DIAGNOSIS — R059 Cough, unspecified: Secondary | ICD-10-CM | POA: Diagnosis not present

## 2020-02-26 DIAGNOSIS — E119 Type 2 diabetes mellitus without complications: Secondary | ICD-10-CM | POA: Diagnosis not present

## 2020-02-26 DIAGNOSIS — E11649 Type 2 diabetes mellitus with hypoglycemia without coma: Secondary | ICD-10-CM | POA: Diagnosis not present

## 2020-02-28 DIAGNOSIS — R0902 Hypoxemia: Secondary | ICD-10-CM | POA: Diagnosis not present

## 2020-02-28 DIAGNOSIS — E11649 Type 2 diabetes mellitus with hypoglycemia without coma: Secondary | ICD-10-CM | POA: Diagnosis not present

## 2020-02-28 DIAGNOSIS — Z20822 Contact with and (suspected) exposure to covid-19: Secondary | ICD-10-CM | POA: Diagnosis not present

## 2020-02-29 DIAGNOSIS — I248 Other forms of acute ischemic heart disease: Secondary | ICD-10-CM | POA: Diagnosis not present

## 2020-02-29 DIAGNOSIS — N400 Enlarged prostate without lower urinary tract symptoms: Secondary | ICD-10-CM | POA: Diagnosis present

## 2020-02-29 DIAGNOSIS — C9130 Prolymphocytic leukemia of B-cell type not having achieved remission: Secondary | ICD-10-CM | POA: Diagnosis not present

## 2020-02-29 DIAGNOSIS — E1165 Type 2 diabetes mellitus with hyperglycemia: Secondary | ICD-10-CM | POA: Diagnosis not present

## 2020-02-29 DIAGNOSIS — E785 Hyperlipidemia, unspecified: Secondary | ICD-10-CM | POA: Diagnosis present

## 2020-02-29 DIAGNOSIS — G9341 Metabolic encephalopathy: Secondary | ICD-10-CM | POA: Diagnosis present

## 2020-02-29 DIAGNOSIS — J9811 Atelectasis: Secondary | ICD-10-CM | POA: Diagnosis not present

## 2020-02-29 DIAGNOSIS — J189 Pneumonia, unspecified organism: Secondary | ICD-10-CM | POA: Diagnosis present

## 2020-02-29 DIAGNOSIS — I5022 Chronic systolic (congestive) heart failure: Secondary | ICD-10-CM | POA: Diagnosis present

## 2020-02-29 DIAGNOSIS — Z953 Presence of xenogenic heart valve: Secondary | ICD-10-CM | POA: Diagnosis not present

## 2020-02-29 DIAGNOSIS — I130 Hypertensive heart and chronic kidney disease with heart failure and stage 1 through stage 4 chronic kidney disease, or unspecified chronic kidney disease: Secondary | ICD-10-CM | POA: Diagnosis present

## 2020-02-29 DIAGNOSIS — C3411 Malignant neoplasm of upper lobe, right bronchus or lung: Secondary | ICD-10-CM | POA: Diagnosis present

## 2020-02-29 DIAGNOSIS — E1122 Type 2 diabetes mellitus with diabetic chronic kidney disease: Secondary | ICD-10-CM | POA: Diagnosis present

## 2020-02-29 DIAGNOSIS — R0902 Hypoxemia: Secondary | ICD-10-CM | POA: Diagnosis present

## 2020-02-29 DIAGNOSIS — Z794 Long term (current) use of insulin: Secondary | ICD-10-CM | POA: Diagnosis not present

## 2020-02-29 DIAGNOSIS — C7911 Secondary malignant neoplasm of bladder: Secondary | ICD-10-CM | POA: Diagnosis present

## 2020-02-29 DIAGNOSIS — E162 Hypoglycemia, unspecified: Secondary | ICD-10-CM | POA: Diagnosis not present

## 2020-02-29 DIAGNOSIS — Z8546 Personal history of malignant neoplasm of prostate: Secondary | ICD-10-CM | POA: Diagnosis not present

## 2020-02-29 DIAGNOSIS — C61 Malignant neoplasm of prostate: Secondary | ICD-10-CM | POA: Diagnosis present

## 2020-02-29 DIAGNOSIS — N179 Acute kidney failure, unspecified: Secondary | ICD-10-CM | POA: Diagnosis present

## 2020-02-29 DIAGNOSIS — E875 Hyperkalemia: Secondary | ICD-10-CM | POA: Diagnosis not present

## 2020-02-29 DIAGNOSIS — Z20822 Contact with and (suspected) exposure to covid-19: Secondary | ICD-10-CM | POA: Diagnosis present

## 2020-02-29 DIAGNOSIS — I5023 Acute on chronic systolic (congestive) heart failure: Secondary | ICD-10-CM | POA: Diagnosis not present

## 2020-02-29 DIAGNOSIS — Z87891 Personal history of nicotine dependence: Secondary | ICD-10-CM | POA: Diagnosis not present

## 2020-02-29 DIAGNOSIS — I2510 Atherosclerotic heart disease of native coronary artery without angina pectoris: Secondary | ICD-10-CM | POA: Diagnosis present

## 2020-02-29 DIAGNOSIS — N1832 Chronic kidney disease, stage 3b: Secondary | ICD-10-CM | POA: Diagnosis not present

## 2020-02-29 DIAGNOSIS — E1142 Type 2 diabetes mellitus with diabetic polyneuropathy: Secondary | ICD-10-CM | POA: Diagnosis present

## 2020-02-29 DIAGNOSIS — N183 Chronic kidney disease, stage 3 unspecified: Secondary | ICD-10-CM | POA: Diagnosis present

## 2020-02-29 DIAGNOSIS — Z951 Presence of aortocoronary bypass graft: Secondary | ICD-10-CM | POA: Diagnosis not present

## 2020-02-29 DIAGNOSIS — R918 Other nonspecific abnormal finding of lung field: Secondary | ICD-10-CM | POA: Diagnosis not present

## 2020-02-29 DIAGNOSIS — E11649 Type 2 diabetes mellitus with hypoglycemia without coma: Secondary | ICD-10-CM | POA: Diagnosis present

## 2020-02-29 DIAGNOSIS — C9110 Chronic lymphocytic leukemia of B-cell type not having achieved remission: Secondary | ICD-10-CM | POA: Diagnosis present

## 2020-03-01 DIAGNOSIS — Z8546 Personal history of malignant neoplasm of prostate: Secondary | ICD-10-CM | POA: Diagnosis not present

## 2020-03-01 DIAGNOSIS — I130 Hypertensive heart and chronic kidney disease with heart failure and stage 1 through stage 4 chronic kidney disease, or unspecified chronic kidney disease: Secondary | ICD-10-CM | POA: Diagnosis not present

## 2020-03-01 DIAGNOSIS — C9110 Chronic lymphocytic leukemia of B-cell type not having achieved remission: Secondary | ICD-10-CM | POA: Diagnosis not present

## 2020-03-01 DIAGNOSIS — E875 Hyperkalemia: Secondary | ICD-10-CM | POA: Diagnosis not present

## 2020-03-01 DIAGNOSIS — G9341 Metabolic encephalopathy: Secondary | ICD-10-CM | POA: Diagnosis not present

## 2020-03-01 DIAGNOSIS — N1832 Chronic kidney disease, stage 3b: Secondary | ICD-10-CM | POA: Diagnosis not present

## 2020-03-01 DIAGNOSIS — R0902 Hypoxemia: Secondary | ICD-10-CM | POA: Diagnosis not present

## 2020-03-01 DIAGNOSIS — C9130 Prolymphocytic leukemia of B-cell type not having achieved remission: Secondary | ICD-10-CM | POA: Diagnosis not present

## 2020-03-01 DIAGNOSIS — J189 Pneumonia, unspecified organism: Secondary | ICD-10-CM | POA: Diagnosis not present

## 2020-03-01 DIAGNOSIS — I2510 Atherosclerotic heart disease of native coronary artery without angina pectoris: Secondary | ICD-10-CM | POA: Diagnosis not present

## 2020-03-01 DIAGNOSIS — C3411 Malignant neoplasm of upper lobe, right bronchus or lung: Secondary | ICD-10-CM | POA: Diagnosis not present

## 2020-03-01 DIAGNOSIS — I5023 Acute on chronic systolic (congestive) heart failure: Secondary | ICD-10-CM | POA: Diagnosis not present

## 2020-03-02 DIAGNOSIS — I5023 Acute on chronic systolic (congestive) heart failure: Secondary | ICD-10-CM | POA: Diagnosis not present

## 2020-03-02 DIAGNOSIS — E1142 Type 2 diabetes mellitus with diabetic polyneuropathy: Secondary | ICD-10-CM | POA: Diagnosis not present

## 2020-03-02 DIAGNOSIS — E875 Hyperkalemia: Secondary | ICD-10-CM | POA: Diagnosis not present

## 2020-03-02 DIAGNOSIS — J189 Pneumonia, unspecified organism: Secondary | ICD-10-CM | POA: Diagnosis not present

## 2020-03-02 DIAGNOSIS — E162 Hypoglycemia, unspecified: Secondary | ICD-10-CM | POA: Diagnosis not present

## 2020-03-02 DIAGNOSIS — N1832 Chronic kidney disease, stage 3b: Secondary | ICD-10-CM | POA: Diagnosis not present

## 2020-03-02 DIAGNOSIS — Z794 Long term (current) use of insulin: Secondary | ICD-10-CM | POA: Diagnosis not present

## 2020-03-02 DIAGNOSIS — G9341 Metabolic encephalopathy: Secondary | ICD-10-CM | POA: Diagnosis not present

## 2020-03-02 DIAGNOSIS — I130 Hypertensive heart and chronic kidney disease with heart failure and stage 1 through stage 4 chronic kidney disease, or unspecified chronic kidney disease: Secondary | ICD-10-CM | POA: Diagnosis not present

## 2020-03-08 DIAGNOSIS — J189 Pneumonia, unspecified organism: Secondary | ICD-10-CM | POA: Diagnosis not present

## 2020-03-08 DIAGNOSIS — Z23 Encounter for immunization: Secondary | ICD-10-CM | POA: Diagnosis not present

## 2020-03-08 DIAGNOSIS — Z0000 Encounter for general adult medical examination without abnormal findings: Secondary | ICD-10-CM | POA: Diagnosis not present

## 2020-03-08 DIAGNOSIS — E119 Type 2 diabetes mellitus without complications: Secondary | ICD-10-CM | POA: Diagnosis not present

## 2020-03-08 DIAGNOSIS — I10 Essential (primary) hypertension: Secondary | ICD-10-CM | POA: Diagnosis not present

## 2020-03-16 ENCOUNTER — Ambulatory Visit: Payer: Medicare Other | Admitting: Family Medicine

## 2020-11-18 DEATH — deceased
